# Patient Record
Sex: Male | Born: 1950 | Race: White | Hispanic: No | Marital: Married | Smoking: Never smoker
Health system: Southern US, Community
[De-identification: ages and names within clinical notes are randomized; demographics above are authoritative.]

## PROBLEM LIST (undated history)

## (undated) DIAGNOSIS — Z8619 Personal history of other infectious and parasitic diseases: Secondary | ICD-10-CM

## (undated) DIAGNOSIS — I10 Essential (primary) hypertension: Secondary | ICD-10-CM

## (undated) DIAGNOSIS — G4730 Sleep apnea, unspecified: Secondary | ICD-10-CM

## (undated) DIAGNOSIS — K219 Gastro-esophageal reflux disease without esophagitis: Secondary | ICD-10-CM

## (undated) DIAGNOSIS — I200 Unstable angina: Secondary | ICD-10-CM

## (undated) DIAGNOSIS — M549 Dorsalgia, unspecified: Secondary | ICD-10-CM

## (undated) DIAGNOSIS — I2510 Atherosclerotic heart disease of native coronary artery without angina pectoris: Secondary | ICD-10-CM

## (undated) DIAGNOSIS — H8110 Benign paroxysmal vertigo, unspecified ear: Secondary | ICD-10-CM

## (undated) DIAGNOSIS — K4090 Unilateral inguinal hernia, without obstruction or gangrene, not specified as recurrent: Secondary | ICD-10-CM

## (undated) DIAGNOSIS — E785 Hyperlipidemia, unspecified: Secondary | ICD-10-CM

## (undated) DIAGNOSIS — N529 Male erectile dysfunction, unspecified: Secondary | ICD-10-CM

## (undated) DIAGNOSIS — R252 Cramp and spasm: Secondary | ICD-10-CM

## (undated) HISTORY — DX: Cramp and spasm: R25.2

## (undated) HISTORY — PX: COLONOSCOPY: SHX174

## (undated) HISTORY — PX: KNEE ARTHROSCOPY: SHX127

## (undated) HISTORY — DX: Hyperlipidemia, unspecified: E78.5

## (undated) HISTORY — DX: Sleep apnea, unspecified: G47.30

## (undated) HISTORY — DX: Male erectile dysfunction, unspecified: N52.9

## (undated) HISTORY — DX: Dorsalgia, unspecified: M54.9

## (undated) HISTORY — DX: Personal history of other infectious and parasitic diseases: Z86.19

## (undated) HISTORY — DX: Unilateral inguinal hernia, without obstruction or gangrene, not specified as recurrent: K40.90

## (undated) HISTORY — DX: Essential (primary) hypertension: I10

## (undated) HISTORY — PX: CARDIAC CATHETERIZATION: SHX172

## (undated) HISTORY — DX: Benign paroxysmal vertigo, unspecified ear: H81.10

## (undated) HISTORY — PX: SHOULDER SURGERY: SHX246

---

## 1998-02-06 ENCOUNTER — Emergency Department (HOSPITAL_COMMUNITY): Admission: EM | Admit: 1998-02-06 | Discharge: 1998-02-06 | Payer: Self-pay | Admitting: Emergency Medicine

## 1998-02-06 ENCOUNTER — Encounter: Payer: Self-pay | Admitting: Emergency Medicine

## 2002-01-04 ENCOUNTER — Inpatient Hospital Stay (HOSPITAL_COMMUNITY): Admission: EM | Admit: 2002-01-04 | Discharge: 2002-01-05 | Payer: Self-pay | Admitting: *Deleted

## 2004-04-14 ENCOUNTER — Ambulatory Visit: Payer: Self-pay | Admitting: Family Medicine

## 2005-01-25 ENCOUNTER — Ambulatory Visit: Payer: Self-pay | Admitting: Internal Medicine

## 2005-06-20 ENCOUNTER — Ambulatory Visit: Payer: Self-pay | Admitting: Family Medicine

## 2005-09-11 ENCOUNTER — Ambulatory Visit: Payer: Self-pay | Admitting: Internal Medicine

## 2005-09-27 ENCOUNTER — Ambulatory Visit: Payer: Self-pay | Admitting: Internal Medicine

## 2005-11-17 ENCOUNTER — Emergency Department (HOSPITAL_COMMUNITY): Admission: EM | Admit: 2005-11-17 | Discharge: 2005-11-17 | Payer: Self-pay | Admitting: Emergency Medicine

## 2006-03-14 ENCOUNTER — Ambulatory Visit: Payer: Self-pay | Admitting: Internal Medicine

## 2006-04-16 ENCOUNTER — Ambulatory Visit: Payer: Self-pay | Admitting: Internal Medicine

## 2006-05-16 ENCOUNTER — Ambulatory Visit: Payer: Self-pay | Admitting: Internal Medicine

## 2006-05-30 ENCOUNTER — Ambulatory Visit: Payer: Self-pay | Admitting: Internal Medicine

## 2006-07-29 ENCOUNTER — Ambulatory Visit: Payer: Self-pay | Admitting: Internal Medicine

## 2006-08-27 ENCOUNTER — Ambulatory Visit: Payer: Self-pay | Admitting: Internal Medicine

## 2006-11-15 ENCOUNTER — Encounter: Payer: Self-pay | Admitting: Internal Medicine

## 2006-11-15 DIAGNOSIS — I10 Essential (primary) hypertension: Secondary | ICD-10-CM

## 2006-11-15 DIAGNOSIS — K219 Gastro-esophageal reflux disease without esophagitis: Secondary | ICD-10-CM

## 2006-11-15 HISTORY — DX: Essential (primary) hypertension: I10

## 2006-11-21 ENCOUNTER — Ambulatory Visit: Payer: Self-pay | Admitting: Internal Medicine

## 2007-01-16 ENCOUNTER — Ambulatory Visit: Payer: Self-pay | Admitting: Internal Medicine

## 2007-01-16 DIAGNOSIS — J069 Acute upper respiratory infection, unspecified: Secondary | ICD-10-CM | POA: Insufficient documentation

## 2007-01-23 ENCOUNTER — Telehealth: Payer: Self-pay | Admitting: Internal Medicine

## 2007-02-13 ENCOUNTER — Emergency Department (HOSPITAL_COMMUNITY): Admission: EM | Admit: 2007-02-13 | Discharge: 2007-02-13 | Payer: Self-pay | Admitting: Emergency Medicine

## 2007-02-14 ENCOUNTER — Telehealth: Payer: Self-pay | Admitting: Internal Medicine

## 2007-03-03 ENCOUNTER — Ambulatory Visit: Payer: Self-pay | Admitting: Internal Medicine

## 2007-03-03 LAB — CONVERTED CEMR LAB
ALT: 19 units/L (ref 0–53)
AST: 20 units/L (ref 0–37)
Albumin: 4.1 g/dL (ref 3.5–5.2)
Alkaline Phosphatase: 80 units/L (ref 39–117)
BUN: 16 mg/dL (ref 6–23)
Basophils Absolute: 0 10*3/uL (ref 0.0–0.1)
Basophils Relative: 0 % (ref 0.0–1.0)
Bilirubin Urine: NEGATIVE
Bilirubin, Direct: 0.2 mg/dL (ref 0.0–0.3)
CO2: 29 meq/L (ref 19–32)
Calcium: 9.9 mg/dL (ref 8.4–10.5)
Chloride: 104 meq/L (ref 96–112)
Cholesterol: 167 mg/dL (ref 0–200)
Creatinine, Ser: 0.9 mg/dL (ref 0.4–1.5)
Eosinophils Absolute: 0.2 10*3/uL (ref 0.0–0.6)
Eosinophils Relative: 3 % (ref 0.0–5.0)
GFR calc Af Amer: 112 mL/min
GFR calc non Af Amer: 93 mL/min
Glucose, Bld: 88 mg/dL (ref 70–99)
Glucose, Urine, Semiquant: NEGATIVE
HCT: 42.8 % (ref 39.0–52.0)
HDL: 31.5 mg/dL — ABNORMAL LOW (ref >39.0)
Hemoglobin: 15 g/dL (ref 13.0–17.0)
Ketones, urine, test strip: NEGATIVE
LDL Cholesterol: 106 mg/dL — ABNORMAL HIGH (ref 0–99)
Lymphocytes Relative: 28.6 % (ref 12.0–46.0)
MCHC: 35.1 g/dL (ref 30.0–36.0)
MCV: 94.1 fL (ref 78.0–100.0)
Monocytes Absolute: 0.6 10*3/uL (ref 0.2–0.7)
Monocytes Relative: 7.8 % (ref 3.0–11.0)
Neutro Abs: 4.3 10*3/uL (ref 1.4–7.7)
Neutrophils Relative %: 60.6 % (ref 43.0–77.0)
Nitrite: NEGATIVE
PSA: 0.49 ng/mL (ref 0.10–4.00)
Platelets: 253 10*3/uL (ref 150–400)
Potassium: 4.5 meq/L (ref 3.5–5.1)
Protein, U semiquant: NEGATIVE
RBC: 4.55 M/uL (ref 4.22–5.81)
RDW: 13 % (ref 11.5–14.6)
Sodium: 141 meq/L (ref 135–145)
Specific Gravity, Urine: 1.02
TSH: 1.22 microintl units/mL (ref 0.35–5.50)
Total Bilirubin: 0.8 mg/dL (ref 0.3–1.2)
Total CHOL/HDL Ratio: 5.3
Total Protein: 6.9 g/dL (ref 6.0–8.3)
Triglycerides: 149 mg/dL (ref 0–149)
Urobilinogen, UA: 0.2
VLDL: 30 mg/dL (ref 0–40)
WBC Urine, dipstick: NEGATIVE
WBC: 7.2 10*3/uL (ref 4.5–10.5)
pH: 5.5

## 2007-03-10 ENCOUNTER — Ambulatory Visit: Payer: Self-pay | Admitting: Internal Medicine

## 2007-03-10 DIAGNOSIS — M549 Dorsalgia, unspecified: Secondary | ICD-10-CM

## 2007-03-10 HISTORY — DX: Dorsalgia, unspecified: M54.9

## 2007-09-04 ENCOUNTER — Ambulatory Visit: Payer: Self-pay | Admitting: Internal Medicine

## 2007-10-27 ENCOUNTER — Emergency Department (HOSPITAL_COMMUNITY): Admission: EM | Admit: 2007-10-27 | Discharge: 2007-10-27 | Payer: Self-pay | Admitting: Emergency Medicine

## 2007-12-05 ENCOUNTER — Ambulatory Visit: Payer: Self-pay | Admitting: Internal Medicine

## 2008-03-02 ENCOUNTER — Ambulatory Visit: Payer: Self-pay | Admitting: Internal Medicine

## 2008-03-02 DIAGNOSIS — R109 Unspecified abdominal pain: Secondary | ICD-10-CM | POA: Insufficient documentation

## 2008-05-11 ENCOUNTER — Ambulatory Visit: Payer: Self-pay | Admitting: Internal Medicine

## 2008-05-14 ENCOUNTER — Telehealth: Payer: Self-pay | Admitting: Internal Medicine

## 2008-10-04 ENCOUNTER — Ambulatory Visit: Payer: Self-pay | Admitting: Internal Medicine

## 2008-10-12 ENCOUNTER — Telehealth: Payer: Self-pay | Admitting: Internal Medicine

## 2008-10-18 ENCOUNTER — Ambulatory Visit: Payer: Self-pay | Admitting: Internal Medicine

## 2008-11-16 ENCOUNTER — Ambulatory Visit: Payer: Self-pay | Admitting: Internal Medicine

## 2008-11-16 DIAGNOSIS — G4730 Sleep apnea, unspecified: Secondary | ICD-10-CM | POA: Insufficient documentation

## 2008-11-16 HISTORY — DX: Sleep apnea, unspecified: G47.30

## 2008-12-03 ENCOUNTER — Ambulatory Visit: Payer: Self-pay | Admitting: Family Medicine

## 2008-12-03 ENCOUNTER — Encounter: Payer: Self-pay | Admitting: Internal Medicine

## 2008-12-03 DIAGNOSIS — R252 Cramp and spasm: Secondary | ICD-10-CM

## 2008-12-03 HISTORY — DX: Cramp and spasm: R25.2

## 2008-12-03 LAB — CONVERTED CEMR LAB
BUN: 20 mg/dL (ref 6–23)
CO2: 25 meq/L (ref 19–32)
Calcium: 9.8 mg/dL (ref 8.4–10.5)
Chloride: 105 meq/L (ref 96–112)
Creatinine, Ser: 0.97 mg/dL (ref 0.40–1.50)
Glucose, Bld: 82 mg/dL (ref 70–99)
Potassium: 4.9 meq/L (ref 3.5–5.3)
Sodium: 141 meq/L (ref 135–145)

## 2008-12-09 ENCOUNTER — Telehealth (INDEPENDENT_AMBULATORY_CARE_PROVIDER_SITE_OTHER): Payer: Self-pay | Admitting: *Deleted

## 2009-04-09 HISTORY — PX: COLONOSCOPY: SHX174

## 2009-08-08 ENCOUNTER — Ambulatory Visit: Payer: Self-pay | Admitting: Internal Medicine

## 2009-08-08 DIAGNOSIS — H8110 Benign paroxysmal vertigo, unspecified ear: Secondary | ICD-10-CM

## 2009-08-08 HISTORY — DX: Benign paroxysmal vertigo, unspecified ear: H81.10

## 2009-10-13 ENCOUNTER — Ambulatory Visit: Payer: Self-pay | Admitting: Internal Medicine

## 2009-10-13 LAB — CONVERTED CEMR LAB
Bilirubin Urine: NEGATIVE
Glucose, Urine, Semiquant: NEGATIVE
Ketones, urine, test strip: NEGATIVE
Nitrite: NEGATIVE
Protein, U semiquant: NEGATIVE
Specific Gravity, Urine: 1.01
Urobilinogen, UA: 0.2
WBC Urine, dipstick: NEGATIVE
pH: 5

## 2009-10-14 ENCOUNTER — Telehealth: Payer: Self-pay | Admitting: Family Medicine

## 2009-10-14 ENCOUNTER — Ambulatory Visit: Payer: Self-pay | Admitting: Family Medicine

## 2009-10-14 DIAGNOSIS — R319 Hematuria, unspecified: Secondary | ICD-10-CM

## 2009-10-14 LAB — CONVERTED CEMR LAB
Basophils Absolute: 0.1 10*3/uL (ref 0.0–0.1)
Basophils Relative: 0.8 % (ref 0.0–3.0)
Eosinophils Absolute: 0.2 10*3/uL (ref 0.0–0.7)
Eosinophils Relative: 2.5 % (ref 0.0–5.0)
HCT: 41.7 % (ref 39.0–52.0)
Hemoglobin: 14.4 g/dL (ref 13.0–17.0)
Lymphocytes Relative: 29.5 % (ref 12.0–46.0)
Lymphs Abs: 2.7 10*3/uL (ref 0.7–4.0)
MCHC: 34.5 g/dL (ref 30.0–36.0)
MCV: 94.7 fL (ref 78.0–100.0)
Monocytes Absolute: 0.5 10*3/uL (ref 0.1–1.0)
Monocytes Relative: 5.4 % (ref 3.0–12.0)
Neutro Abs: 5.6 10*3/uL (ref 1.4–7.7)
Neutrophils Relative %: 61.8 % (ref 43.0–77.0)
Platelets: 265 10*3/uL (ref 150.0–400.0)
RBC: 4.41 M/uL (ref 4.22–5.81)
RDW: 13.6 % (ref 11.5–14.6)
WBC: 9 10*3/uL (ref 4.5–10.5)

## 2009-10-20 ENCOUNTER — Ambulatory Visit: Payer: Self-pay | Admitting: Internal Medicine

## 2009-10-21 ENCOUNTER — Ambulatory Visit: Payer: Self-pay | Admitting: Cardiology

## 2009-10-24 ENCOUNTER — Telehealth: Payer: Self-pay | Admitting: Internal Medicine

## 2009-12-05 ENCOUNTER — Ambulatory Visit: Payer: Self-pay | Admitting: Internal Medicine

## 2009-12-05 DIAGNOSIS — R1031 Right lower quadrant pain: Secondary | ICD-10-CM

## 2009-12-05 DIAGNOSIS — K4090 Unilateral inguinal hernia, without obstruction or gangrene, not specified as recurrent: Secondary | ICD-10-CM | POA: Insufficient documentation

## 2009-12-05 HISTORY — DX: Unilateral inguinal hernia, without obstruction or gangrene, not specified as recurrent: K40.90

## 2009-12-16 ENCOUNTER — Encounter: Payer: Self-pay | Admitting: Family Medicine

## 2010-01-09 ENCOUNTER — Ambulatory Visit: Payer: Self-pay | Admitting: Internal Medicine

## 2010-01-09 LAB — HM COLONOSCOPY

## 2010-02-06 ENCOUNTER — Ambulatory Visit: Payer: Self-pay | Admitting: Internal Medicine

## 2010-04-21 ENCOUNTER — Telehealth: Payer: Self-pay | Admitting: Internal Medicine

## 2010-04-25 ENCOUNTER — Ambulatory Visit
Admission: RE | Admit: 2010-04-25 | Discharge: 2010-04-25 | Payer: Self-pay | Source: Home / Self Care | Attending: Internal Medicine | Admitting: Internal Medicine

## 2010-05-03 ENCOUNTER — Telehealth: Payer: Self-pay | Admitting: Internal Medicine

## 2010-05-11 NOTE — Consult Note (Signed)
Summary: [facility]   Imported By: Maryln Gottron 01/19/2010 14:47:19  _____________________________________________________________________  External Attachment:    Type:   Image     Comment:   External Document

## 2010-05-11 NOTE — Assessment & Plan Note (Signed)
Summary: 6 MONTH FUP//CCM   Vital Signs:  Patient profile:   60 year old male Height:      71 inches Weight:      243 pounds BMI:     34.01 Temp:     98.3 degrees F oral Pulse rate:   76 / minute Resp:     14 per minute BP sitting:   140 / 84  (left arm)  Vitals Entered By: Willy Eddy, LPN (February 06, 2010 4:08 PM) CC: 6 month roa Is Patient Diabetic? No   Primary Care Provider:  Jeralyn Ruths  CC:  6 month roa.  History of Present Illness: 60 year old patient who is seen today for his bimonthly follow up.  He has treated hypertension.  He has had a recent GI and general surgical evaluation for lower abdominal pain.  This has improved.  He is recovering from a URI but in general, doing quite well.  He does have gastroesophageal reflux disease  Preventive Screening-Counseling & Management  Alcohol-Tobacco     Smoking Status: never  Current Problems (verified): 1)  Abdominal Pain-rlq  (ICD-789.03) 2)  Inguinal Hernia, Right  (ICD-550.90) 3)  Special Screening For Malignant Neoplasms Colon  (ICD-V76.51) 4)  Hematuria Unspecified  (ICD-599.70) 5)  Vertigo, Positional  (ICD-386.11) 6)  Leg Cramps  (ICD-729.82) 7)  Sleep Apnea  (ICD-780.57) 8)  Abdominal Pain  (ICD-789.00) 9)  Back Pain  (ICD-724.5) 10)  Upper Respiratory Infection, Viral  (ICD-465.9) 11)  Hypertension  (ICD-401.9) 12)  Gerd  (ICD-530.81)  Current Medications (verified): 1)  Epipen 0.3 Mg/0.11ml (1:1000)  Devi (Epinephrine Hcl (Anaphylaxis)) 2)  Benicar 20 Mg Tabs (Olmesartan Medoxomil) .... One Daily 3)  Prilosec Otc 20 Mg Tbec (Omeprazole Magnesium) .Marland Kitchen.. 1 By Mouth Once Daily As Needed  Allergies (verified): 1)  Codeine Phosphate (Codeine Phosphate) 2)  Penicillin G Potassium (Penicillin G Potassium)  Past History:  Past Medical History: Reviewed history from 11/16/2008 and no changes required. ED GERD Hypertension history of mumps orchitis rule out OSA  Past Surgical  History: Reviewed history from 03/10/2007 and no changes required. R knee surgery arthroscopic 2003, negative heart catheterization  Family History: Reviewed history from 03/10/2007 and no changes required. both parents, history of hypertension.  Two brothers two sisters, one sister with hypertension one sister deceased from leukemia at age 85  father died at age 79, hypertension, renal failure mother history of late onset diabetes- died age 24 complications of pneumonia following a hip fracture  Review of Systems  The patient denies anorexia, fever, weight loss, weight gain, vision loss, decreased hearing, hoarseness, chest pain, syncope, dyspnea on exertion, peripheral edema, prolonged cough, headaches, hemoptysis, abdominal pain, melena, hematochezia, severe indigestion/heartburn, hematuria, incontinence, genital sores, muscle weakness, suspicious skin lesions, transient blindness, difficulty walking, depression, unusual weight change, abnormal bleeding, enlarged lymph nodes, angioedema, breast masses, and testicular masses.    Physical Exam  General:  overweight-appearing.  140/80 Head:  Normocephalic and atraumatic without obvious abnormalities. No apparent alopecia or balding. Eyes:  No corneal or conjunctival inflammation noted. EOMI. Perrla. Funduscopic exam benign, without hemorrhages, exudates or papilledema. Vision grossly normal. Ears:  External ear exam shows no significant lesions or deformities.  Otoscopic examination reveals clear canals, tympanic membranes are intact bilaterally without bulging, retraction, inflammation or discharge. Hearing is grossly normal bilaterally. Mouth:  Oral mucosa and oropharynx without lesions or exudates.  Teeth in good repair. Neck:  No deformities, masses, or tenderness noted. Lungs:  Normal respiratory effort, chest expands  symmetrically. Lungs are clear to auscultation, no crackles or wheezes. Heart:  Normal rate and regular rhythm. S1 and  S2 normal without gallop, murmur, click, rub or other extra sounds. Abdomen:  Bowel sounds positive,abdomen soft and non-tender without masses, organomegaly or hernias noted. Msk:  No deformity or scoliosis noted of thoracic or lumbar spine.   Pulses:  R and L carotid,radial,femoral,dorsalis pedis and posterior tibial pulses are full and equal bilaterally Extremities:  No clubbing, cyanosis, edema, or deformity noted with normal full range of motion of all joints.     Impression & Recommendations:  Problem # 1:  INGUINAL HERNIA, RIGHT (ICD-550.90)  Problem # 2:  HYPERTENSION (ICD-401.9)  His updated medication list for this problem includes:    Benicar 20 Mg Tabs (Olmesartan medoxomil) ..... One daily  Problem # 3:  GERD (ICD-530.81)  His updated medication list for this problem includes:    Prilosec Otc 20 Mg Tbec (Omeprazole magnesium) .Marland Kitchen... 1 by mouth once daily as needed  Complete Medication List: 1)  Epipen 0.3 Mg/0.108ml (1:1000) Devi (Epinephrine hcl (anaphylaxis)) 2)  Benicar 20 Mg Tabs (Olmesartan medoxomil) .... One daily 3)  Prilosec Otc 20 Mg Tbec (Omeprazole magnesium) .Marland Kitchen.. 1 by mouth once daily as needed  Patient Instructions: 1)  Please schedule a follow-up appointment in 6 months for CPX  2)  Limit your Sodium (Salt). 3)  It is important that you exercise regularly at least 20 minutes 5 times a week. If you develop chest pain, have severe difficulty breathing, or feel very tired , stop exercising immediately and seek medical attention. 4)  You need to lose weight. Consider a lower calorie diet and regular exercise.  5)  Check your Blood Pressure regularly. If it is above: you should make an appointment. Prescriptions: BENICAR 20 MG TABS (OLMESARTAN MEDOXOMIL) one daily  #90 x 6   Entered and Authorized by:   Gordy Savers  MD   Signed by:   Gordy Savers  MD on 02/06/2010   Method used:   Electronically to        [facility] [address] [phone]*  (retail)       [address]       [place], [place]  [address]       Ph: [phone] or [phone]       Fax: [phone]   RxID:   [id number]    Orders Added: 1)  Est. Patient Level IV [69678]

## 2010-05-11 NOTE — Assessment & Plan Note (Signed)
Summary: pain in groin area//ccm/pt rsc/cjr   Vital Signs:  Patient profile:   60 year old male Weight:      241 pounds Temp:     98.6 degrees F oral BP sitting:   100 / 72  (left arm) Cuff size:   large  Vitals Entered By: Sid Falcon LPN (October 14, 4130 4:40 PM) CC: pain right groin area X 1 week   History of Present Illness: Patient is seen with one week history of initial complaint of right groin pain. On further questioning this is actually right lower abdominal. Pain is mild to moderate intensity. Worse with sitting. Not progressive. No injury. Denies any history of hernia or any bulging. No fever. Good appetite. Denies nausea, vomiting, or diarrhea. No alleviating factors. No significant pain with ambulation. No dysuria.  No hx of kidney stones.  Allergies: 1)  Codeine Phosphate (Codeine Phosphate) 2)  Penicillin G Potassium (Penicillin G Potassium)  Past History:  Past Medical History: Last updated: 11/16/2008 ED GERD Hypertension history of mumps orchitis rule out OSA PMH reviewed for relevance  Review of Systems  The patient denies anorexia, fever, weight loss, chest pain, peripheral edema, hemoptysis, melena, hematochezia, severe indigestion/heartburn, hematuria, incontinence, and muscle weakness.    Physical Exam  General:  Well-developed,well-nourished,in no acute distress; alert,appropriate and cooperative throughout examination Head:  Normocephalic and atraumatic without obvious abnormalities. No apparent alopecia or balding. Mouth:  Oral mucosa and oropharynx without lesions or exudates.  Teeth in good repair. Neck:  No deformities, masses, or tenderness noted. Lungs:  Normal respiratory effort, chest expands symmetrically. Lungs are clear to auscultation, no crackles or wheezes. Heart:  Normal rate and regular rhythm. S1 and S2 normal without gallop, murmur, click, rub or other extra sounds. Abdomen:  normal bowel sounds. Soft with mild tenderness in  right lower quadrant to deep palpation but he also has some minimal somewhat lesser tenderness left lower quadrant. No upper abdominal tenderness. No guarding or rebound. No masses palpated. Genitalia:  atrophic testes bilaterally. No hernia   Impression & Recommendations:  Problem # 1:  ABDOMINAL PAIN (ICD-789.00) Assessment New urine dipstick reveals 2+ blood otherwise normal. CBC obtained.  doubt acute appendicitis in this patient with symptoms for one week with no fever and normal appetite but if white count elevated consider CT to further assess. Orders: TLB-CBC Platelet - w/Differential (85025-CBCD) UA Dipstick w/o Micro (manual) (44010)  Complete Medication List: 1)  Epipen 0.3 Mg/0.31ml (1:1000) Devi (Epinephrine hcl (anaphylaxis)) 2)  Benicar 20 Mg Tabs (Olmesartan medoxomil) .... One daily 3)  Voltaren-xr 100 Mg Xr24h-tab (Diclofenac sodium) .... Prn  Patient Instructions: 1)  followup immediately if you have any increased fever, vomiting, or worsening abdominal pain  Laboratory Results   Urine Tests    Routine Urinalysis   Color: yellow Appearance: Clear Glucose: negative   (Normal Range: Negative) Bilirubin: negative   (Normal Range: Negative) Ketone: negative   (Normal Range: Negative) Spec. Gravity: 1.010   (Normal Range: 1.003-1.035) Blood: moderate   (Normal Range: Negative) pH: 5.0   (Normal Range: 5.0-8.0) Protein: negative   (Normal Range: Negative) Urobilinogen: 0.2   (Normal Range: 0-1) Nitrite: negative   (Normal Range: Negative) Leukocyte Esterace: negative   (Normal Range: Negative)    Comments: Sid Falcon LPN  October 14, 2723 5:24 PM

## 2010-05-11 NOTE — Progress Notes (Signed)
Summary: Status of Urology evaluation  Phone Note Outgoing Call   Call placed by: Milford Cage NCMA,  April 21, 2010 8:54 AM Call placed to: Specialist Summary of Call: [facility] to get notes from referral made in Sept.  They stated the patient rescheduled appt. to 01/02/10 from 12/21/09 and cxl'd both appts.    Initial call taken by: Milford Cage NCMA,  April 21, 2010 8:55 AM  Follow-up for Phone Call        Thanks for following up. I'll forward this to his PCP, Dr. Lesia Hausen. Follow-up by: Hilarie Fredrickson MD,  April 21, 2010 10:18 AM

## 2010-05-11 NOTE — Assessment & Plan Note (Signed)
Summary: abd pain,hematuria---em   History of Present Illness Visit Type: Initial Consult Primary GI MD: Yancey Flemings MD Primary Provider: Jeralyn Ruths Requesting Provider: Jeralyn Ruths Chief Complaint: RLQ pain in groin area. Has been havin the pain for 2 months History of Present Illness:   60 year old white male with hypertension and GERD. He sent to the regarding the two-month history of right lower quadrant/groin pain. He describes sharp discomfort lasting a few seconds. This comes and goes. He can not directly related to meals, activity, or defecation. He was evaluated with blood work and urinalysis was found to have microscopic hematuria. He underwent both noncontrast and contrast enhanced CT scan with no significant abnormalities noted. He denies nausea vomiting melena hematochezia or weight loss. No prior GI evaluations. He has been encouraged to have screening colonoscopy. No family history of GI malignancy. His GI review of systems is remarkable for 3-4 year history of intermittent GERD symptoms with dietary indiscretion. Occasional Prilosec OTC helps. No dysphagia. He is a nonsmoker and does not use alcohol.   GI Review of Systems    Reports abdominal pain, acid reflux, and  heartburn.     Location of  Abdominal pain: RLQ.    Denies belching, bloating, chest pain, dysphagia with liquids, dysphagia with solids, loss of appetite, nausea, vomiting, vomiting blood, weight loss, and  weight gain.        Denies anal fissure, black tarry stools, change in bowel habit, constipation, diarrhea, diverticulosis, fecal incontinence, heme positive stool, hemorrhoids, irritable bowel syndrome, jaundice, light color stool, liver problems, rectal bleeding, and  rectal pain. Preventive Screening-Counseling & Management  Alcohol-Tobacco     Smoking Status: never    Current Medications (verified): 1)  Epipen 0.3 Mg/0.84ml (1:1000)  Devi (Epinephrine Hcl (Anaphylaxis)) 2)  Benicar 20 Mg  Tabs (Olmesartan Medoxomil) .... One Daily 3)  Voltaren-Xr 100 Mg Xr24h-Tab (Diclofenac Sodium) .... Prn 4)  Hydrocodone-Acetaminophen 5-325 Mg Tabs (Hydrocodone-Acetaminophen) .... One To Two Tabs By Mouth Every 4-6 Hours As Needed Pain 5)  Prilosec Otc 20 Mg Tbec (Omeprazole Magnesium) .Marland Kitchen.. 1 By Mouth Once Daily As Needed  Allergies (verified): 1)  Codeine Phosphate (Codeine Phosphate) 2)  Penicillin G Potassium (Penicillin G Potassium)  Past History:  Past Medical History: Reviewed history from 11/16/2008 and no changes required. ED GERD Hypertension history of mumps orchitis rule out OSA  Past Surgical History: Reviewed history from 03/10/2007 and no changes required. R knee surgery arthroscopic 2003, negative heart catheterization  Family History: Reviewed history from 03/10/2007 and no changes required. both parents, history of hypertension.  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  Alcohol Use - no Daily Caffeine Use  Review of Systems       The patient complains of back pain.  The patient denies allergy/sinus, anemia, anxiety-new, arthritis/joint pain, blood in urine, breast changes/lumps, change in vision, confusion, cough, coughing up blood, depression-new, fainting, fatigue, fever, headaches-new, hearing problems, heart murmur, heart rhythm changes, itching, muscle pains/cramps, night sweats, nosebleeds, shortness of breath, skin rash, sleeping problems, sore throat, swelling of feet/legs, swollen lymph glands, thirst - excessive, urination - excessive, urination changes/pain, urine leakage, vision changes, and voice change.    Vital Signs:  Patient profile:   60 year old male Height:      21  inches Weight:      243 pounds BMI:      34.01 BSA:     2.29 Pulse rate:   80 / minute Pulse rhythm:   regular BP sitting:   110 / 90  (left arm)  Vitals Entered By: Merri Ray CMA Duncan Dull) (December 05, 2009 9:24 AM)  Physical Exam  General:  Well developed, well nourished, no acute distress. Head:  Normocephalic and atraumatic. Eyes:  PERRLA, no icterus. Ears:  Normal auditory acuity. Nose:  No deformity, discharge,  or lesions. Mouth:  No deformity or lesions, dentition normal. Neck:  Supple; no masses or thyromegaly. Lungs:  Clear throughout to auscultation. Heart:  Regular rate and rhythm; no murmurs, rubs,  or bruits. Abdomen:  Soft,and nondistended. Mild tenderness in the deep right lower quadrant with palpation. No mass.. No masses, hepatosplenomegaly or abdominal hernias noted. Normal bowel sounds. Rectal:  deferred until colonoscopy Genitalia:  possible right inguinal hernia. Tenderness Msk:  Symmetrical with no gross deformities. Normal posture. Pulses:  Normal pulses noted. Extremities:  No clubbing, cyanosis, edema or deformities noted. Neurologic:  Alert and  oriented x4;  grossly normal neurologically. Skin:  Intact without significant lesions or rashes. Psych:  Alert and cooperative. Normal mood and affect.   Impression & Recommendations:  Problem # 1:  ABDOMINAL PAIN-RLQ (ICD-789.03) right lower quadrant and right inguinal discomfort with possible he will hernia.  Plan: #1. General surgical opinion regarding possible right inguinal hernia  Problem # 2:  HEMATURIA UNSPECIFIED (ICD-599.70) microscopic hematuria with right lower quadrant/inguinal discomfort. Negative CT scan.  Plan: #1. Urologic evaluation to determine if any further workup warranted regarding microscopic hematuria in this 60 year old with right lower quadrant pain  Problem # 3:  SPECIAL SCREENING FOR MALIGNANT NEOPLASMS COLON (ICD-V76.51) the patient is an appropriate candidate for screening colonoscopy without  contraindication. Can also rule out intracolonic cause for pain.. The nature of colonoscopy as well as risks, benefits, and alternatives were reviewed. He understood and agreed to proceed. Movi prep prescribed. The patient instructed on its use.  Problem # 4:  GERD (ICD-530.81) GERD without alarm features. Recommend reflux precautions. Okay to use Prilosec on demand  Other Orders: Colonoscopy (Colon) [facility] ([facility]) [facility]  Patient Instructions: 1)  Copy sent to : Jeralyn Ruths, Glenna Fellows, MD, Vic Blackbird, MD 2)  Colon [facility] 01/09/10 3:00 pm arrive at 2:00 pm. 3)  Movi prep instructions given to patient. 4)  Movi prep Rx. sent to pharmacy. 5)  [facility] 12/16/09 2:30 pm arrive at 2:00 pm Dr. Johna Sheriff  6)  [facility] Appt. 12/21/09 1:00 pm arrive at 12:45 pm Dr. Aldean Ast 7)  Colonoscopy and Flexible Sigmoidoscopy brochure given.  8)  The medication list was reviewed and reconciled.  All changed / newly prescribed medications were explained.  A complete medication list was provided to the patient / caregiver. Prescriptions: MOVIPREP 100 GM  SOLR (PEG-KCL-NACL-NASULF-NA ASC-C) As per prep instructions.  #1 x 0   Entered by:   Milford Cage NCMA   Authorized by:   Hilarie Fredrickson MD   Signed by:   Milford Cage NCMA on 12/05/2009   Method used:   Electronically to        [facility] [address] [phone]* (retail)       [address]       [place], [place]  [address]       Ph: [phone]  or [phone]       Fax: [phone]   RxID:   [id number]

## 2010-05-11 NOTE — Assessment & Plan Note (Signed)
Summary: st/njr   Vital Signs:  Patient profile:   60 year old male Weight:      248 pounds Temp:     98.5 degrees F BP sitting:   122 / 82  Vitals Entered By: Lynann Beaver CMA AAMA (April 25, 2010 4:19 PM) CC: sore throat Is Patient Diabetic? No Pain Assessment Patient in pain? no        Primary Care Provider:  Jeralyn Ruths  CC:  sore throat.  History of Present Illness: a 60 year old patient with a one-week history of sore throat and head and chest congestion and mild nonproductive cough.  His chief complaint is sore throat pain.  He does have a history of treated hypertension, which has been stable, as well as gastroesophageal reflux disease.  He also has a history of hand dermatitis related to occupational exposure solvents  Current Medications (verified): 1)  Epipen 0.3 Mg/0.71ml (1:1000)  Devi (Epinephrine Hcl (Anaphylaxis)) 2)  Benicar 20 Mg Tabs (Olmesartan Medoxomil) .... One Daily 3)  Prilosec Otc 20 Mg Tbec (Omeprazole Magnesium) .Marland Kitchen.. 1 By Mouth Once Daily As Needed  Allergies (verified): 1)  Codeine Phosphate (Codeine Phosphate) 2)  Penicillin G Potassium (Penicillin G Potassium)  Past History:  Past Medical History: Reviewed history from 11/16/2008 and no changes required. ED GERD Hypertension history of mumps orchitis rule out OSA  Past Surgical History: Reviewed history from 03/10/2007 and no changes required. R knee surgery arthroscopic 2003, negative heart catheterization  Family History: Reviewed history from 02/06/2010 and no changes required. both parents, history of hypertension.  Two brothers two sisters, one sister with hypertension one sister deceased from leukemia at age 73  father died at age 64, hypertension, renal failure mother history of late onset diabetes- died age 89 complications of pneumonia following a hip fracture  Social History: Reviewed history from 12/05/2009 and no changes required. 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  Alcohol Use - no Daily Caffeine Use  Review of Systems       The patient complains of anorexia and prolonged cough.  The patient denies fever, weight loss, weight gain, vision loss, decreased hearing, hoarseness, chest pain, syncope, dyspnea on exertion, peripheral edema, headaches, hemoptysis, abdominal pain, melena, hematochezia, severe indigestion/heartburn, hematuria, incontinence, genital sores, muscle weakness, suspicious skin lesions, transient blindness, difficulty walking, depression, unusual weight change, abnormal bleeding, enlarged lymph nodes, angioedema, breast masses, and testicular masses.    Physical Exam  General:  overweight-appearing.  120/80overweight-appearing.   Head:  Normocephalic and atraumatic without obvious abnormalities. No apparent alopecia or balding. Eyes:  No corneal or conjunctival inflammation noted. EOMI. Perrla. Funduscopic exam benign, without hemorrhages, exudates or papilledema. Vision grossly normal. Ears:  External ear exam shows no significant lesions or deformities.  Otoscopic examination reveals clear canals, tympanic membranes are intact bilaterally without bulging, retraction, inflammation or discharge. Hearing is grossly normal bilaterally. Mouth:  pharyngeal erythema.  pharyngeal erythema.   Neck:  No deformities, masses, or tenderness noted. Lungs:  Normal respiratory effort, chest expands symmetrically. Lungs are clear to auscultation, no crackles or wheezes. Heart:  Normal rate and regular rhythm. S1 and S2 normal without gallop, murmur, click, rub or other extra sounds. Abdomen:  Bowel sounds positive,abdomen soft and non-tender without masses, organomegaly or hernias noted. Msk:  No deformity or scoliosis noted of thoracic or lumbar spine.     Impression & Recommendations:  Problem # 1:  PHARYNGITIS-ACUTE (ICD-462)  Problem # 2:  HYPERTENSION (ICD-401.9)  His updated  medication list for this problem includes:    Benicar 20 Mg Tabs (Olmesartan medoxomil) ..... One daily  His updated medication list for this problem includes:    Benicar 20 Mg Tabs (Olmesartan medoxomil) ..... One daily  Complete Medication List: 1)  Epipen 0.3 Mg/0.41ml (1:1000) Devi (Epinephrine hcl (anaphylaxis)) 2)  Benicar 20 Mg Tabs (Olmesartan medoxomil) .... One daily 3)  Prilosec Otc 20 Mg Tbec (Omeprazole magnesium) .Marland Kitchen.. 1 by mouth once daily as needed 4)  Hydrocodone-acetaminophen 5-500 Mg Tabs (Hydrocodone-acetaminophen) .... One every 6 hours as needed  Patient Instructions: 1)  Please schedule a follow-up appointment in 6 months. 2)  Limit your Sodium (Salt) to less than 2 grams a day(slightly less than 1/2 a teaspoon) to prevent fluid retention, swelling, or worsening of symptoms. Prescriptions: HYDROCODONE-ACETAMINOPHEN 5-500 MG TABS (HYDROCODONE-ACETAMINOPHEN) one every 6 hours as needed  #40 x 0   Entered and Authorized by:   Gordy Savers  MD   Signed by:   Gordy Savers  MD on 04/25/2010   Method used:   Print then Give to Patient   RxID:   [id number]    Orders Added: 1)  Est. Patient Level III [72536]

## 2010-05-11 NOTE — Progress Notes (Signed)
Summary: xray results  Phone Note Call from Patient   Caller: Patient Call For: Dr. Caryl Never Summary of Call: Pt is asking for xray results. [phone] Initial call taken by: Lynann Beaver CMA,  October 14, 2009 4:59 PM  Follow-up for Phone Call        see x-ray result. Follow-up by: Evelena Peat MD,  October 14, 2009 5:44 PM  Additional Follow-up for Phone Call Additional follow up Details #1::        Pt informed and he voiced his understanding Additional Follow-up by: Sid Falcon LPN,  October 15, 4538 5:48 PM

## 2010-05-11 NOTE — Progress Notes (Signed)
Summary: sore throat  Phone Note Call from Patient   Caller: Patient Call For: Gordy Savers  MD Summary of Call: Pt is stilll having the same symptoms as last week with sore throat and chest congestion and wants RX called in. [phone] Initial call taken by: [facility] CMA AAMA,  May 03, 2010 3:31 PM  Follow-up for Phone Call        OK to RF generic vicodin 5-500  #50 for ST and cough; also suggest Mucinex D twice daily for congestion  Follow-up by: Gordy Savers  MD,  May 04, 2010 8:15 AM    Prescriptions: HYDROCODONE-ACETAMINOPHEN 5-500 MG TABS (HYDROCODONE-ACETAMINOPHEN) one every 6 hours as needed  #50 x 0   Entered by:   Lynann Beaver CMA AAMA   Authorized by:   Gordy Savers  MD   Signed by:   Lynann Beaver CMA AAMA on 05/04/2010   Method used:   Telephoned to ...       [facility]  [address] [phone]* (retail)       [address]       [place], [place]  [address]       Ph: [phone] or [phone]       Fax: [phone]   RxID:   [id number]  Notified pt.

## 2010-05-11 NOTE — Assessment & Plan Note (Signed)
Summary: problems with ? kidney stones/pain lower back and side/cjr  I did the are  Vital Signs:  Patient profile:   60 year old male Weight:      243 pounds Temp:     98.2 degrees F oral BP sitting:   120 / 70  (right arm) Cuff size:   regular  Vitals Entered By: Duard Brady LPN (October 20, 2009 4:04 PM) CC: c/o low back apin, waekness ?kidney stone Is Patient Diabetic? No   CC:  c/o low back apin and waekness ?kidney stone.  History of Present Illness: [age] year-old patient who was seen about one week ago with right lower back and groin pain.  He is alert, and have the hematuria at that time.  He does have pain in the back and especially right groin area.  Pain is paroxysmal.  His bowel habits have been normal.  He has a history of hypertension, but no prior history of renal stone disease.  There is been no fever  Allergies: 1)  Codeine Phosphate (Codeine Phosphate) 2)  Penicillin G Potassium (Penicillin G Potassium)  Past History:  Past Medical History: Reviewed history from 11/16/2008 and no changes required. ED GERD Hypertension history of mumps orchitis rule out OSA  Review of Systems       The patient complains of anorexia and abdominal pain.  The patient denies fever, weight loss, weight gain, vision loss, decreased hearing, hoarseness, chest pain, syncope, dyspnea on exertion, peripheral edema, prolonged cough, headaches, hemoptysis, melena, hematochezia, severe indigestion/heartburn, hematuria, incontinence, genital sores, muscle weakness, suspicious skin lesions, transient blindness, difficulty walking, depression, unusual weight change, abnormal bleeding, enlarged lymph nodes, angioedema, breast masses, and testicular masses.    Physical Exam  General:  overweight-appearing.  mildly uncomfortable.  Blood pressure normal Neck:  No deformities, masses, or tenderness noted. Lungs:  Normal respiratory effort, chest expands symmetrically. Lungs are clear to  auscultation, no crackles or wheezes. Heart:  Normal rate and regular rhythm. S1 and S2 normal without gallop, murmur, click, rub or other extra sounds. Abdomen:  mild right lower quadrant tenderness.  Bowel sounds normal.  No rigidity or guarding Msk:  No deformity or scoliosis noted of thoracic or lumbar spine.     Impression & Recommendations:  Problem # 1:  HEMATURIA UNSPECIFIED (ICD-599.70)  Orders: Radiology Referral (Radiology) suspect renal stone.  Will set up for a CT urogram to rule out hydronephrosis.  Confirm diagnosis.  Assess stone size and rule out other pathology to account for the hematuria  Problem # 2:  BACK PAIN (ICD-724.5)  His updated medication list for this problem includes:    Voltaren-xr 100 Mg Xr24h-tab (Diclofenac sodium) .Marland Kitchen... Prn    Hydrocodone-acetaminophen 5-325 Mg Tabs (Hydrocodone-acetaminophen) ..... One to two tabs by mouth every 4-6 hours as needed pain  Orders: UA Dipstick w/o Micro (manual) ([address]) Radiology Referral (Radiology)  Complete Medication List: 1)  Epipen 0.3 Mg/0.14ml (1:1000) Devi (Epinephrine hcl (anaphylaxis)) 2)  Benicar 20 Mg Tabs (Olmesartan medoxomil) .... One daily 3)  Voltaren-xr 100 Mg Xr24h-tab (Diclofenac sodium) .... Prn 4)  Hydrocodone-acetaminophen 5-325 Mg Tabs (Hydrocodone-acetaminophen) .... One to two tabs by mouth every 4-6 hours as needed pain  Patient Instructions: 1)  Drink as much fluid as you can tolerate for the next few days. 2)  CT urogram as scheduled Prescriptions: HYDROCODONE-ACETAMINOPHEN 5-325 MG TABS (HYDROCODONE-ACETAMINOPHEN) one to two tabs by mouth every 4-6 hours as needed pain  #60 x 2   Entered and Authorized by:  Gordy Savers  MD   Signed by:   Gordy Savers  MD on 10/20/2009   Method used:   Print then Give to Patient   RxID:   [id number]   Appended Document: problems with ? kidney stones/pain lower back and side/cjr     Allergies: 1)  Codeine Phosphate  (Codeine Phosphate) 2)  Penicillin G Potassium (Penicillin G Potassium)   Complete Medication List: 1)  Epipen 0.3 Mg/0.17ml (1:1000) Devi (Epinephrine hcl (anaphylaxis)) 2)  Benicar 20 Mg Tabs (Olmesartan medoxomil) .... One daily 3)  Voltaren-xr 100 Mg Xr24h-tab (Diclofenac sodium) .... Prn 4)  Hydrocodone-acetaminophen 5-325 Mg Tabs (Hydrocodone-acetaminophen) .... One to two tabs by mouth every 4-6 hours as needed pain   Laboratory Results   Urine Tests  Date/Time Received: 10/20/2009 Date/Time Reported: 10/20/2009  Routine Urinalysis   Color: straw Appearance: Clear Glucose: negative   (Normal Range: Negative) Bilirubin: negative   (Normal Range: Negative) Ketone: negative   (Normal Range: Negative) Spec. Gravity: 1.025   (Normal Range: 1.003-1.035) Blood: moderate   (Normal Range: Negative) pH: 5.0   (Normal Range: 5.0-8.0) Protein: negative   (Normal Range: Negative) Urobilinogen: 0.2   (Normal Range: 0-1) Nitrite: negative   (Normal Range: Negative) Leukocyte Esterace: negative   (Normal Range: Negative)

## 2010-05-11 NOTE — Procedures (Signed)
Summary: Colonoscopy  Patient: David Vincent Note: All result statuses are Final unless otherwise noted.  Tests: (1) Colonoscopy (COL)   COL Colonoscopy           DONE     [facility]     [address]     [place], [place]  [address]           COLONOSCOPY PROCEDURE REPORT           PATIENT:  David Vincent, David Vincent  MR#:  [id number]     BIRTHDATE:  September 11, 1950, 59 yrs. old  GENDER:  male     ENDOSCOPIST:  Wilhemina Bonito. Eda Keys, MD     REF. BY:  Eleonore Chiquito, M.D.     PROCEDURE DATE:  01/09/2010     PROCEDURE:  Average-risk screening colonoscopy     G0121     ASA CLASS:  Class II     INDICATIONS:  Routine Risk Screening     MEDICATIONS:   Fentanyl 75 mcg IV, Versed 8 mg IV           DESCRIPTION OF PROCEDURE:   After the risks benefits and     alternatives of the procedure were thoroughly explained, informed     consent was obtained.  Digital rectal exam was performed and     revealed no abnormalities.   The LB CF-[id number] endoscope     was introduced through the anus and advanced to the cecum, which     was identified by both the appendix and ileocecal valve, without     limitations.Time to cecum = 2:02 min.  The quality of the prep was     excellent, using MoviPrep.  The instrument was then slowly     withdrawn (time = 9:59 min) as the colon was fully examined.     <<PROCEDUREIMAGES>>           FINDINGS:  Moderate diverticulosis was found ascending colon to     sigmoid colon.  This was otherwise a normal examination of the     colon.  No polyps or cancers were seen.   Retroflexed views in the     rectum revealed no abnormalities.    The scope was then withdrawn     from the patient and the procedure completed.           COMPLICATIONS:  None           ENDOSCOPIC IMPRESSION:     1) Moderate diverticulosis ascending colon to sigmoid colon     2) Otherwise normal examination     3) No polyps or cancers     RECOMMENDATIONS:     1) Continue current colorectal  screening recommendations for     "routine risk" patients with a repeat colonoscopy in 10 years.           ______________________________     Wilhemina Bonito. Eda Keys, MD           CC:  Gordy Savers, MD;Benjamin Hoxworth, MD;The Patient           n.     Rosalie DoctorWilhemina Bonito. Eda Keys at 01/09/2010 03:50 PM           Hazeline Junker, [phone]  Note: An exclamation mark (!) indicates a result that was not dispersed into the flowsheet. Document Creation Date: 01/09/2010 3:51 PM _______________________________________________________________________  (1) Order result status: Final Collection or observation date-time: 01/09/2010 15:43 Requested date-time:  Receipt date-time:  Reported date-time:  Referring Physician:   Ordering Physician: Fransico Setters [phone]) Specimen Source:  Source: Launa Grill Order Number: [id number] Lab site:   Appended Document: Colonoscopy    Clinical Lists Changes  Observations: Added new observation of COLONNXTDUE: 01/2020 (01/09/2010 16:22)

## 2010-05-11 NOTE — Progress Notes (Signed)
Summary:  still having pain  Req referral to GI  Phone Note Call from Patient Call back at [phone]   Caller: Patient Summary of Call: Pt called and said that he is still having same pain as before, in groin area. Pt is req referral to GI. Pls call.  Initial call taken by: Lucy Antigua,  October 24, 2009 9:25 AM  Follow-up for Phone Call        OK to refer to GI  Follow-up by: Gordy Savers  MD,  October 24, 2009 10:10 AM  Additional Follow-up for Phone Call Additional follow up Details #1::        order given for GI referal - pt aware infor to terri and she will call as soon as she makes appt. KIK Additional Follow-up by: Duard Brady LPN,  October 24, 2009 10:18 AM

## 2010-05-11 NOTE — Letter (Signed)
Summary: [facility] Instructions  [facility]  [address] [place], [place] [address]   Phone: [phone]  Fax: [phone]       David Vincent    May 16, 1950    MRN: [id number]        Procedure Day /Date:MONDAY, 01/09/10     Arrival Time:2:00 PM     Procedure Time:3:00 PM     Location of Procedure:                    X  [facility] (4th Floor)   PREPARATION FOR COLONOSCOPY WITH MOVIPREP   Starting 5 days prior to your procedure 01/04/2010 do not eat nuts, seeds, popcorn, corn, beans, peas,  salads, or any raw vegetables.  Do not take any fiber supplements (e.g. Metamucil, Citrucel, and Benefiber).  THE DAY BEFORE YOUR PROCEDURE         DATE: 01/08/2010 DAY: SUNDAY  1.  Drink clear liquids the entire day-NO SOLID FOOD  2.  Do not drink anything colored red or purple.  Avoid juices with pulp.  No orange juice.  3.  Drink at least 64 oz. (8 glasses) of fluid/clear liquids during the day to prevent dehydration and help the prep work efficiently.  CLEAR LIQUIDS INCLUDE: Water Jello Ice Popsicles Tea (sugar ok, no milk/cream) Powdered fruit flavored drinks Coffee (sugar ok, no milk/cream) Gatorade Juice: apple, white grape, white cranberry  Lemonade Clear bullion, consomm, broth Carbonated beverages (any kind) Strained chicken noodle soup Hard Candy                             4.  In the morning, mix first dose of MoviPrep solution:    Empty 1 Pouch A and 1 Pouch B into the disposable container    Add lukewarm drinking water to the top line of the container. Mix to dissolve    Refrigerate (mixed solution should be used within 24 hrs)  5.  Begin drinking the prep at 5:00 p.m. The MoviPrep container is divided by 4 marks.   Every 15 minutes drink the solution down to the next mark (approximately 8 oz) until the full liter is complete.   6.  Follow completed prep with 16 oz of clear liquid of your choice (Nothing red or purple).  Continue  to drink clear liquids until bedtime.  7.  Before going to bed, mix second dose of MoviPrep solution:    Empty 1 Pouch A and 1 Pouch B into the disposable container    Add lukewarm drinking water to the top line of the container. Mix to dissolve    Refrigerate  THE DAY OF YOUR PROCEDURE      DATE: 01/09/2010 DAY: MONDAY  Beginning at 10:00a.m. (5 hours before procedure):         1. Every 15 minutes, drink the solution down to the next mark (approx 8 oz) until the full liter is complete.  2. Follow completed prep with 16 oz. of clear liquid of your choice.    3. You may drink clear liquids until 1PM(2 HOURS BEFORE PROCEDURE).   MEDICATION INSTRUCTIONS  Unless otherwise instructed, you should take regular prescription medications with a small sip of water   as early as possible the morning of your procedure.        OTHER INSTRUCTIONS  You will need a responsible adult at least 60 years of age to accompany you and drive  you home.   This person must remain in the waiting room during your procedure.  Wear loose fitting clothing that is easily removed.  Leave jewelry and other valuables at home.  However, you may wish to bring a book to read or  an iPod/MP3 player to listen to music as you wait for your procedure to start.  Remove all body piercing jewelry and leave at home.  Total time from sign-in until discharge is approximately 2-3 hours.  You should go home directly after your procedure and rest.  You can resume normal activities the  day after your procedure.  The day of your procedure you should not:   Drive   Make legal decisions   Operate machinery   Drink alcohol   Return to work  You will receive specific instructions about eating, activities and medications before you leave.    The above instructions have been reviewed and explained to me by   _______________________    I fully understand and can verbalize these instructions  _____________________________ Date _________

## 2010-05-11 NOTE — Progress Notes (Signed)
Summary: Pain med request  Phone Note Outgoing Call Call back at [phone]   Call placed by: Sid Falcon LPN,  October 14, 8293 1:42 PM Call placed to: Patient Summary of Call: Pt informed of x-ray and he will go now.  Pt asked for something for pain, he did npot sleep much last night due to pain. [facility] [address] Initial call taken by: Sid Falcon LPN,  October 15, 6211 1:43 PM  Follow-up for Phone Call        may call in Vicodin 5/325 mg 1-2 by mouth q4-6 hours as needed pain  #30 with no refills. Follow-up by: Evelena Peat MD,  October 14, 2009 1:49 PM  Additional Follow-up for Phone Call Additional follow up Details #1::        Rx called in, pt informed on VM Additional Follow-up by: Sid Falcon LPN,  October 15, 863 2:26 PM    New/Updated Medications: HYDROCODONE-ACETAMINOPHEN 5-325 MG TABS (HYDROCODONE-ACETAMINOPHEN) one to two tabs by mouth every 4-6 hours as needed pain Prescriptions: HYDROCODONE-ACETAMINOPHEN 5-325 MG TABS (HYDROCODONE-ACETAMINOPHEN) one to two tabs by mouth every 4-6 hours as needed pain  #30 x 0   Entered by:   Sid Falcon LPN   Authorized by:   Evelena Peat MD   Signed by:   Sid Falcon LPN on 78/46/9629   Method used:   Telephoned to ...       [facility]  [address] [phone]* (retail)       [address]       [place], [place]  [address]       Ph: [phone] or [phone]       Fax: [phone]   RxID:   [id number]

## 2010-05-11 NOTE — Assessment & Plan Note (Signed)
Summary: dizziness x 1 week//ccm   Vital Signs:  Patient profile:   60 year old male Weight:      244 pounds Temp:     98.1 degrees F oral BP sitting:   120 / 70  (right arm) Cuff size:   large  Vitals Entered By: Duard Brady LPN (Aug 08, 452 1:02 PM) CC: c/o dizziness in AM , headaches x1week Is Patient Diabetic? No   CC:  c/o dizziness in AM  and headaches x1week.  History of Present Illness: 60 year old patient who has had some mild positional vertigo over the past week, which show worsened today.  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  He describes a feeling of movement and unsteadiness.  For the past few days.  She has had some mild headaches, but no other symptoms.  Denies much in the way of allergy related symptoms.  He is taking no medications.  He does have treated hypertension, but denies any real orthostatic symptoms  Allergies: 1)  Codeine Phosphate (Codeine Phosphate) 2)  Penicillin G Potassium (Penicillin G Potassium)  Past History:  Past Medical History: Reviewed history from 11/16/2008 and no changes required. ED GERD Hypertension history of mumps orchitis rule out OSA  Review of Systems  The patient denies anorexia, fever, weight loss, weight gain, vision loss, decreased hearing, hoarseness, chest pain, syncope, dyspnea on exertion, peripheral edema, prolonged cough, headaches, hemoptysis, abdominal pain, melena, hematochezia, severe indigestion/heartburn, hematuria, incontinence, genital sores, muscle weakness, suspicious skin lesions, transient blindness, difficulty walking, depression, unusual weight change, abnormal bleeding, enlarged lymph nodes, angioedema, breast masses, and testicular masses.    Physical Exam  General:  overweight-appearing.  120/70 without orthostatic changes Head:  Normocephalic and atraumatic without obvious abnormalities. No apparent alopecia or  balding. Eyes:  No corneal or conjunctival inflammation noted. EOMI. Perrla. Funduscopic exam benign, without hemorrhages, exudates or papilledema. Vision grossly normal. Ears:  External ear exam shows no significant lesions or deformities.  Otoscopic examination reveals clear canals, tympanic membranes are intact bilaterally without bulging, retraction, inflammation or discharge. Hearing is grossly normal bilaterally. Mouth:  Oral mucosa and oropharynx without lesions or exudates.  Teeth in good repair. Neck:  No deformities, masses, or tenderness noted. Lungs:  Normal respiratory effort, chest expands symmetrically. Lungs are clear to auscultation, no crackles or wheezes. Heart:  Normal rate and regular rhythm. S1 and S2 normal without gallop, murmur, click, rub or other extra sounds. Neurologic:  alert & oriented X3, cranial nerves II-XII intact, gait normal, and finger-to-nose normal.     Impression & Recommendations:  Problem # 1:  VERTIGO, POSITIONAL (ICD-386.11)  The following medications were removed from the medication list:    Fexofenadine Hcl 180 Mg Tabs (Fexofenadine hcl) .Marland Kitchen... 1 once daily  Problem # 2:  HYPERTENSION (ICD-401.9)  His updated medication list for this problem includes:    Benicar 20 Mg Tabs (Olmesartan medoxomil) ..... One daily  Complete Medication List: 1)  Epipen 0.3 Mg/0.44ml (1:1000) Devi (Epinephrine hcl (anaphylaxis)) 2)  Benicar 20 Mg Tabs (Olmesartan medoxomil) .... One daily 3)  Voltaren-xr 100 Mg Xr24h-tab (Diclofenac sodium) .... Prn  Patient Instructions: 1)  use fenofexadine  or Zyrtec one daily 2)  Please schedule a follow-up appointment as needed.

## 2010-07-13 ENCOUNTER — Other Ambulatory Visit (INDEPENDENT_AMBULATORY_CARE_PROVIDER_SITE_OTHER): Payer: 59

## 2010-07-13 DIAGNOSIS — Z0000 Encounter for general adult medical examination without abnormal findings: Secondary | ICD-10-CM

## 2010-07-13 DIAGNOSIS — Z0389 Encounter for observation for other suspected diseases and conditions ruled out: Secondary | ICD-10-CM

## 2010-07-13 LAB — BASIC METABOLIC PANEL
BUN: 19 mg/dL (ref 6–23)
CO2: 26 mEq/L (ref 19–32)
Chloride: 106 mEq/L (ref 96–112)
Creatinine, Ser: 0.8 mg/dL (ref 0.4–1.5)

## 2010-07-13 LAB — CBC WITH DIFFERENTIAL/PLATELET
Basophils Relative: 0.5 % (ref 0.0–3.0)
Eosinophils Absolute: 0.2 10*3/uL (ref 0.0–0.7)
HCT: 40.2 % (ref 39.0–52.0)
Lymphs Abs: 2.5 10*3/uL (ref 0.7–4.0)
MCHC: 35.1 g/dL (ref 30.0–36.0)
MCV: 92.9 fl (ref 78.0–100.0)
Monocytes Absolute: 0.6 10*3/uL (ref 0.1–1.0)
Neutrophils Relative %: 54.4 % (ref 43.0–77.0)
RBC: 4.33 Mil/uL (ref 4.22–5.81)

## 2010-07-13 LAB — LIPID PANEL
Cholesterol: 161 mg/dL (ref 0–200)
Triglycerides: 80 mg/dL (ref 0.0–149.0)

## 2010-07-13 LAB — URINALYSIS, ROUTINE W REFLEX MICROSCOPIC
Bilirubin Urine: NEGATIVE
Ketones, ur: NEGATIVE
Nitrite: NEGATIVE
Total Protein, Urine: NEGATIVE
Urine Glucose: NEGATIVE

## 2010-07-13 LAB — HEPATIC FUNCTION PANEL
ALT: 16 U/L (ref 0–53)
Total Protein: 6.7 g/dL (ref 6.0–8.3)

## 2010-07-19 ENCOUNTER — Encounter: Payer: Self-pay | Admitting: Internal Medicine

## 2010-07-20 ENCOUNTER — Ambulatory Visit (INDEPENDENT_AMBULATORY_CARE_PROVIDER_SITE_OTHER): Payer: 59 | Admitting: Internal Medicine

## 2010-07-20 VITALS — BP 130/80 | HR 72 | Temp 98.2°F | Ht 68.75 in | Wt 243.0 lb

## 2010-07-20 DIAGNOSIS — Z0000 Encounter for general adult medical examination without abnormal findings: Secondary | ICD-10-CM

## 2010-07-20 MED ORDER — OMEPRAZOLE MAGNESIUM 20 MG PO TBEC: 20.0000 mg | DELAYED_RELEASE_TABLET | Freq: Every day | ORAL | Status: DC

## 2010-07-20 MED ORDER — EPINEPHRINE 0.3 MG/0.3ML IJ DEVI: 0.3000 mg | Freq: Once | INTRAMUSCULAR | Status: AC

## 2010-07-20 MED ORDER — OLMESARTAN MEDOXOMIL 20 MG PO TABS: 20.0000 mg | ORAL_TABLET | Freq: Every day | ORAL | Status: DC

## 2010-07-20 NOTE — Progress Notes (Signed)
Subjective:    Patient ID: David Vincent, male    DOB: September 18, 1950, 60 y.o.   MRN: [id number]  HPI  60 year old patient seen today for an annual exam. He is doing quite well did have a screening colonoscopy that was normal in October of last year. He has done well although will be losing his job in the very near future. He has treated hypertension which has done quite well on Benicar 20 mg daily he has had a history of ACE associated cough.  Past Medical History  Diagnosis Date  . BACK PAIN 03/10/2007  . HYPERTENSION 11/15/2006  . INGUINAL HERNIA, RIGHT 12/05/2009  . LEG CRAMPS 12/03/2008  . SLEEP APNEA 11/16/2008  . VERTIGO, POSITIONAL 08/08/2009  . ED (erectile dysfunction)   . History of mumps orchitis    Past Surgical History  Procedure Date  . Cardiac catheterization     does not have a smoking history on file. He does not have any smokeless tobacco history on file. His alcohol and drug histories not on file. family history is not on file. Allergies  Allergen Reactions  . Codeine Phosphate     REACTION: unspecified  . Penicillins     REACTION: unspecified       History of Present Illness:  60 year old male seen today for an annual physical  Current Allergies:  CODEINE PHOSPHATE (CODEINE PHOSPHATE)  PENICILLIN G POTASSIUM (PENICILLIN G POTASSIUM)  Past Medical History:  ED  GERD  Hypertension  history of mumps orchitis  Past Surgical History:  R knee surgery arthroscopic  2003, negative heart catheterization  Family History:  both parents, history of hypertension. Two brothers two sisters, one sister with hypertension one sister deceased from leukemia at age 64  father died at age 31, hypertension, renal failure  mother history of late onset diabetes  Review of Systems  denies      Review of Systems  Constitutional: Negative for fever, chills, activity change, appetite change and fatigue.  HENT: Negative for hearing loss, ear pain, congestion, rhinorrhea,  sneezing, mouth sores, trouble swallowing, neck pain, neck stiffness, dental problem, voice change, sinus pressure and tinnitus.   Eyes: Negative for photophobia, pain, redness and visual disturbance.  Respiratory: Negative for apnea, cough, choking, chest tightness, shortness of breath and wheezing.   Cardiovascular: Negative for chest pain, palpitations and leg swelling.  Gastrointestinal: Negative for nausea, vomiting, abdominal pain, diarrhea, constipation, blood in stool, abdominal distention, anal bleeding and rectal pain.  Genitourinary: Negative for dysuria, urgency, frequency, hematuria, flank pain, decreased urine volume, discharge, penile swelling, scrotal swelling, difficulty urinating, genital sores and testicular pain.  Musculoskeletal: Negative for myalgias, back pain, joint swelling, arthralgias and gait problem.  Skin: Negative for color change, rash and wound.  Neurological: Negative for dizziness, tremors, seizures, syncope, facial asymmetry, speech difficulty, weakness, light-headedness, numbness and headaches.  Hematological: Negative for adenopathy. Does not bruise/bleed easily.  Psychiatric/Behavioral: Negative for suicidal ideas, hallucinations, behavioral problems, confusion, sleep disturbance, self-injury, dysphoric mood, decreased concentration and agitation. The patient is not nervous/anxious.        Objective:   Physical Exam  Constitutional: He appears well-developed and well-nourished.       Blood pressure 124/80 Moderately overweight  HENT:  Head: Normocephalic and atraumatic.  Right Ear: External ear normal.  Left Ear: External ear normal.  Nose: Nose normal.  Mouth/Throat: Oropharynx is clear and moist.  Eyes: Conjunctivae and EOM are normal. Pupils are equal, round, and reactive to light. No scleral icterus.  Neck:  Normal range of motion. Neck supple. No JVD present. No thyromegaly present.  Cardiovascular: Regular rhythm, normal heart sounds and intact  distal pulses.  Exam reveals no gallop and no friction rub.   No murmur heard. Pulmonary/Chest: Effort normal and breath sounds normal. He exhibits no tenderness.  Abdominal: Soft. Bowel sounds are normal. He exhibits no distension and no mass. There is no tenderness.  Genitourinary: Rectum normal, prostate normal and penis normal. Guaiac negative stool. No penile tenderness.       Testicular atrophy Suggestion of a small left renal hernia  Musculoskeletal: Normal range of motion. He exhibits no edema and no tenderness.  Lymphadenopathy:    He has no cervical adenopathy.  Neurological: He is alert. He has normal reflexes. No cranial nerve deficit. Coordination normal.  Skin: Skin is warm and dry. No rash noted.  Psychiatric: He has a normal mood and affect. His behavior is normal.          Assessment & Plan:   Annual health assessment exam Hypertension stable  We'll continue his present medical regimen. Depending on his new health plan may require a change in his antihypertensive regimen We'll recheck in 6 months or when necessary

## 2010-07-20 NOTE — Patient Instructions (Signed)
Limit your sodium (Salt) intake    It is important that you exercise regularly, at least 20 minutes 3 to 4 times per week.  If you develop chest pain or shortness of breath seek  medical attention.  You need to lose weight.  Consider a lower calorie diet and regular exercise.  Return in 6 months for follow-up   

## 2010-07-21 ENCOUNTER — Encounter: Payer: Self-pay | Admitting: Internal Medicine

## 2010-08-25 NOTE — H&P (Signed)
NAME:  David Vincent, David Vincent                        ACCOUNT NO.:  [id number]   MEDICAL RECORD NO.:  [id number]                   PATIENT TYPE:  INP   LOCATION:  3309                                 FACILITY:  [facility]   PHYSICIAN:  Duke Salvia, M.D. [facility]           DATE OF BIRTH:  [birth year]   DATE OF ADMISSION:  01/04/2002  DATE OF DISCHARGE:  01/05/2002                                HISTORY & PHYSICAL   REASON FOR ADMISSION:  The patient is admitted to the hospital because of  chest pain and positive enzymes.   HISTORY OF PRESENT ILLNESS:  The patient is a 60 year old gentleman with no  past cardiac history and cardiac risk factors notable for no diagnosis of  diabetes or family history or cigarette use. His cholesterol status is not  known. Over the last  two to three months he has had exertional chest  tightness with radiation to his jaw and his left arm. This was associated  with a very severe episode about two to three weeks ago, which persisted  for about 20 to 30 minutes. It then resolved. Since that time he has had  periods more frequently of discomfort associated with shortness of breath  and diaphoresis.   This morning he awakened at 2 o'clock with similar discomfort of someone  sitting on his chest and burning up through  his central sternal area with  radiation to the left arm. He took Tums. He became quite dyspneic and  choking and was diaphoretic and he and his wife came to the hospital. He was  given aspirin, oxygen and nitroglycerin with resolution of his discomfort.  He also received Lopressor and a GI cocktail. Enzymes demonstrated a  troponin of 0.69; his CK was normal. At this time he has had recurrent  abdominal epigastric discomfort with some radiation into his sternum.   PAST MEDICAL HISTORY:  His past medical history is largely negative as noted  above.  He has had some light knee surgery.   MEDICATIONS:  He takes no regular medications.   ALLERGIES:  HE IS  ALLERGIC TO PENICILLIN AND TO CODEINE, BOTH OF WHICH CAUSE  HIM TO SWELL.   REVIEW OF SYSTEMS:  Noted on the intake sheet and is remarkable for some GE  reflux symptoms. He has also gained a considerable amount of weight over the  last couple of years.   PHYSICAL EXAMINATION:  GENERAL:  He is a white male in no acute distress.  VITAL SIGNS:  Blood pressure 155/88, pulse 67, respiratory rate 18 and  unlabored.  HEENT:  Demonstrated no scleral icterus or xanthoma.  NECK:  The neck veins were flat. The carotids were brisk and full  bilaterally without bruits. There is no lymphadenopathy.  BACK:  Without kyphosis, scoliosis.  LUNGS:  Clear.  HEART:  The heart  sounds were regular with an S4. There is no murmur.  ABDOMEN:  Soft with active  bowel sounds without midline pulsation.  EXTREMITIES:  Femoral pulses were 2+, distal pulses were intact. There is no  cyanosis, clubbing or edema.  NEUROLOGIC:  The neurological exam was grossly normal.  SKIN:  Warm and dry.   LABORATORY DATA:  His electrocardiogram at 5:04 this morning demonstrated  sinus rhythm at 69 with intervals of 0.13, 0.08 and 0.37 and an axis of 28  degrees. The electrocardiogram  was normal.   Laboratories were notable in addition to the troponin of 0.069 for a  potassium of 3.3, a blood sugar of 126, a normal hemogram.   IMPRESSION:  1. Acute coronary syndrome, NSTEMI.  2. Cardiac risk factors, negative, hypertension negative, diabetes negative,     family history negative. Question cholesterol.  3. Hypokalemia and borderline elevated  blood sugar today and borderline     elevated systolic blood pressure.   The patient has an  acute coronary syndrome. He has been randomized as part  of the [facility] trial to receive intravenous heparin. In addition with his  positive enzymes, we will add Integrilin and Plavix to his aspirin. We will  check a fasting lipid profile and begin statin therapy, and also for early  hazard  benefit and the unclear status of  enzymes, we will begin him on low  dose ACE inhibitors. I have reviewed with his family the potential concerns  and the need for catheterization.                                               Duke Salvia, M.D. [facility]    SCK/MEDQ  D:  01/04/2002  T:  01/06/2002  Job:  [id number]

## 2010-08-25 NOTE — Assessment & Plan Note (Signed)
[facility]                            [facility] OFFICE NOTE   NAME:POWELLRodell, Marrs                     MRN:          [id number]  DATE:07/29/2006                            DOB:          [birth year]    A 60 year old gentleman seen today with a chief complaint of low back  pain.  This began over the past weekend when he was doing some heavy  housework requiring a lot of bending and stooping and squatting.  He  also has a persistent rash involving primarily his arms.  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  He had the onset of the rash about the  time he was placed on hydrochlorothiazide, however.  Examination  revealed tenderness along the lumbar area bilaterally.  Straight leg  testing was negative.  Flexion of the hips did aggravate the back pain.   IMPRESSION:  1. Muscle ligamentous strain.  2. Hypertension.  3. Dermatitis.  Unclear whether this is a contact dermatitis versus      possibly related to thiazide diuretic.   DISPOSITION:  The hydrochlorothiazide will be held.  He will be placed  on Benicar 20 mg daily for a month, then reassessed at that time.  He  will be treated with Aleve, Skelaxin, and tramadol for his back pain.     Gordy Savers, MD  Electronically Signed    PFK/MedQ  DD: 07/29/2006  DT: 07/30/2006  Job #: [id number]

## 2010-08-25 NOTE — Discharge Summary (Signed)
   NAME:  David Vincent, David Vincent                        ACCOUNT NO.:  [id number]   MEDICAL RECORD NO.:  [id number]                   PATIENT TYPE:  INP   LOCATION:  3309                                 FACILITY:  [facility]   PHYSICIAN:  Duke Salvia, M.D. [facility]           DATE OF BIRTH:  [birth year]   DATE OF ADMISSION:  01/04/2002  DATE OF DISCHARGE:  01/05/2002                                 DISCHARGE SUMMARY   The patient is a 60 year old white married male who was putting in a new  floor and suddenly became short of breath and hot and developed some chest  heaviness radiating to the left arm.  He came on to the emergency room and  was admitted for further evaluation and therapy.   CLINICAL FINDINGS:  Hemoglobin 13.1 with hematocrit of 38.2 and a white  count of 7800.  CKs were low.  Troponin was 0.57.  Hemoglobin A1c was 4.8.  Electrolytes and renal function were normal.  Total cholesterol was 169,  with triglyceride of 143, HDL at 41, and LDL of 99.  CKs were low x3.   COURSE IN HOSPITAL:  The patient was admitted and his troponins were  slightly up, but his MBs were normal.  We were worried that he was having  acute coronary syndrome and treated him with Integrilin, heparin, Plavix,  and aspirin.  He underwent cardiac catheterization on September 29 by Dr.  Chales Abrahams and was found to have normal coronary arteries and normal LV function.  He was begun on Nexium 40 mg q.d.  He was discharged home later that  evening.  He will follow up with Dr. Amador Cunas long-term.   FINAL DIAGNOSES:  1. Chest pain -- noncardiac.  2. Normal coronary arteries per angiography this admission.  3. Probable gastroesophageal reflux disease.   DISPOSITION:  Patient discharged home on Nexium 40 mg q.d. and will follow  up with Amador Cunas long-term.     Dian Queen, P.A. [facility]                     Duke Salvia, M.D. [facility]    BY/MEDQ  D:  01/05/2002  T:  01/08/2002  Job:  [id number]   cc:   Gordy Savers,  M.D. [facility]

## 2010-08-25 NOTE — Cardiovascular Report (Signed)
NAME:  David Vincent, David Vincent                        ACCOUNT NO.:  [id number]   MEDICAL RECORD NO.:  [id number]                   PATIENT TYPE:  INP   LOCATION:  3309                                 FACILITY:  [facility]   PHYSICIAN:  Veneda Melter, M.D. [facility]               DATE OF BIRTH:  03-09-1951   DATE OF PROCEDURE:  01/05/2002  DATE OF DISCHARGE:                              CARDIAC CATHETERIZATION   PROCEDURE PERFORMED:  1. Left heart catheterization.  2. Left ventriculogram.  3. Selectively coronary angiography.  4. Failed attempted Perclose to right femoral artery.   DIAGNOSES:  1. Mild coronary artery disease by angiogram.  2. Normal left ventricular systolic function.   HISTORY:  The patient is a 60 year old gentleman who presents with severe  substernal chest discomfort.  The patient had no acute ECG changes but he  had mild elevation of cardiac troponin enzymes.  He is referred for further  assessment.   TECHNIQUE:  Informed consent was obtained.  The patient was brought to the  catheterization lab.  A #6 French sheath was placed in the right femoral  artery.  The #6 [place] and JR4 catheters were then used to engage the  left and right coronary arteries and selective angiography performed in  various projections using manual injections of contrast.  A #6 French  pigtail catheter was then advanced to the left ventricle and a left  ventriculogram performed using power injections of contrast.  At the  termination of the case, the Perclose suture closure device was deployed to  the right femoral artery; however, adequate hemostasis could not be achieved  and manual pressure was applied.  Hemostasis was achieved.  The patient was  transferred to the holding area in stable condition.  He tolerated the  procedure well.  Findings are as follows:   FINDINGS:  1. Left main trunk:  Large caliber vessel, angiographically normal.  2. LAD:  This is a large caliber vessel that provides  four diagonal     branches.  The LAD has mild irregularities of 20% in the proximal and mid     section.  3. Left circumflex artery:  Large caliber vessel that provides two marginal     branches.  The left circumflex system has mild irregularities.  4. Ramus intermedius:  This is a small caliber vessel that bifurcates in its     proximal segment.  It has moderate disease of 30-40% at its origin.  5. Right coronary artery:  Dominant.  This is a large caliber vessel that     provides a posterior descending artery and two posterior ventricular     branches in its terminal segment.  The right coronary artery has mild     irregularities.  The distal branch vessels have luminal disease as well.  6. LV:  Normal end systolic and end diastolic dimensions.  Overall left     ventricular function  is well preserved.  Ejection fraction of greater     than 55%.  No mitral regurgitation.   PRESSURES:  1. LV pressure is 120/10.  2. Aortic is 120/70.  3. LVEDP of 20.   ASSESSMENT/PLAN:  The patient is a 60 year old gentleman with noncritical  coronary artery disease and normal LV function.  Risk factor modification  will be pursued and other causes of his chest pain investigated.                                               Veneda Melter, M.D. [facility]    NG/MEDQ  D:  01/05/2002  T:  01/06/2002  Job:  [id number]   cc:   Gordy Savers, M.D. [facility]   Rollene Rotunda, M.D. [facility]  [address]  [place]  [place] [address]  Fax: 1

## 2010-09-28 ENCOUNTER — Ambulatory Visit (INDEPENDENT_AMBULATORY_CARE_PROVIDER_SITE_OTHER): Payer: 59 | Admitting: Internal Medicine

## 2010-09-28 ENCOUNTER — Encounter: Payer: Self-pay | Admitting: Internal Medicine

## 2010-09-28 DIAGNOSIS — H8110 Benign paroxysmal vertigo, unspecified ear: Secondary | ICD-10-CM

## 2010-09-28 DIAGNOSIS — I10 Essential (primary) hypertension: Secondary | ICD-10-CM

## 2010-09-28 MED ORDER — MECLIZINE HCL 25 MG PO TABS: 25.0000 mg | ORAL_TABLET | Freq: Three times a day (TID) | ORAL | Status: DC | PRN

## 2010-09-28 NOTE — Progress Notes (Signed)
  Subjective:    Patient ID: David Vincent, male    DOB: [birth year], 60 y.o.   MRN: [id number]  HPI  60 year old patient who presents with a 2 to three-day history of positional vertigo he has had this in the past. Symptoms are aggravated by positional change. He describes it true vertigo with a spinning sensation that lasts several seconds;  these symptoms are associated with some nausea. No fever or URI symptoms. He does have hypertension treated with Benicar 20 mg daily   Review of Systems  Constitutional: Negative for fever, chills, appetite change and fatigue.  HENT: Negative for hearing loss, ear pain, congestion, sore throat, trouble swallowing, neck stiffness, dental problem, voice change and tinnitus.   Eyes: Negative for pain, discharge and visual disturbance.  Respiratory: Negative for cough, chest tightness, wheezing and stridor.   Cardiovascular: Negative for chest pain, palpitations and leg swelling.  Gastrointestinal: Negative for nausea, vomiting, abdominal pain, diarrhea, constipation, blood in stool and abdominal distention.  Genitourinary: Negative for urgency, hematuria, flank pain, discharge, difficulty urinating and genital sores.  Musculoskeletal: Negative for myalgias, back pain, joint swelling, arthralgias and gait problem.  Skin: Negative for rash.  Neurological: Negative for dizziness, syncope, speech difficulty, weakness, numbness and headaches.  Hematological: Negative for adenopathy. Does not bruise/bleed easily.  Psychiatric/Behavioral: Negative for behavioral problems and dysphoric mood. The patient is not nervous/anxious.        Objective:   Physical Exam  Constitutional: He is oriented to person, place, and time. He appears well-developed.       Blood pressure 120/80 sitting without orthostatic change  HENT:  Head: Normocephalic.  Right Ear: External ear normal.  Left Ear: External ear normal.  Eyes: Conjunctivae and EOM are normal.  Neck: Normal  range of motion.  Cardiovascular: Normal rate and normal heart sounds.   Pulmonary/Chest: Breath sounds normal.  Abdominal: Bowel sounds are normal.  Musculoskeletal: Normal range of motion. He exhibits no edema and no tenderness.  Neurological: He is alert and oriented to person, place, and time.  Psychiatric: He has a normal mood and affect. His behavior is normal.          Assessment & Plan:   Benign positional vertigo Hypertension well controlled no evidence of orthostatic hypotension  We'll treat with meclizine and observe

## 2010-09-28 NOTE — Patient Instructions (Signed)
Call or return to clinic prn if these symptoms worsen or fail to improve as anticipated.

## 2011-05-02 ENCOUNTER — Emergency Department (HOSPITAL_COMMUNITY): Payer: 59

## 2011-05-02 ENCOUNTER — Other Ambulatory Visit: Payer: Self-pay

## 2011-05-02 ENCOUNTER — Encounter (HOSPITAL_COMMUNITY): Payer: Self-pay | Admitting: Emergency Medicine

## 2011-05-02 ENCOUNTER — Observation Stay (HOSPITAL_COMMUNITY)
Admission: EM | Admit: 2011-05-02 | Discharge: 2011-05-03 | Disposition: A | Discharge status: Home / Self Care | Payer: 59 | Source: Ambulatory Visit | Attending: Internal Medicine | Admitting: Internal Medicine

## 2011-05-02 DIAGNOSIS — I200 Unstable angina: Secondary | ICD-10-CM

## 2011-05-02 DIAGNOSIS — K219 Gastro-esophageal reflux disease without esophagitis: Secondary | ICD-10-CM

## 2011-05-02 DIAGNOSIS — I10 Essential (primary) hypertension: Secondary | ICD-10-CM | POA: Insufficient documentation

## 2011-05-02 DIAGNOSIS — I2510 Atherosclerotic heart disease of native coronary artery without angina pectoris: Secondary | ICD-10-CM | POA: Insufficient documentation

## 2011-05-02 DIAGNOSIS — R0602 Shortness of breath: Secondary | ICD-10-CM | POA: Insufficient documentation

## 2011-05-02 DIAGNOSIS — G4730 Sleep apnea, unspecified: Secondary | ICD-10-CM | POA: Insufficient documentation

## 2011-05-02 DIAGNOSIS — R079 Chest pain, unspecified: Principal | ICD-10-CM | POA: Insufficient documentation

## 2011-05-02 HISTORY — DX: Gastro-esophageal reflux disease without esophagitis: K21.9

## 2011-05-02 HISTORY — DX: Atherosclerotic heart disease of native coronary artery without angina pectoris: I25.10

## 2011-05-02 HISTORY — DX: Unstable angina: I20.0

## 2011-05-02 LAB — POCT I-STAT TROPONIN I: Troponin i, poc: 0 ng/mL (ref 0.00–0.08)

## 2011-05-02 LAB — CBC
Hemoglobin: 14.5 g/dL (ref 13.0–17.0)
MCH: 31.9 pg (ref 26.0–34.0)
MCHC: 35.4 g/dL (ref 30.0–36.0)
Platelets: 254 10*3/uL (ref 150–400)
RBC: 4.54 MIL/uL (ref 4.22–5.81)

## 2011-05-02 LAB — DIFFERENTIAL
Basophils Relative: 1 % (ref 0–1)
Eosinophils Absolute: 0.3 10*3/uL (ref 0.0–0.7)
Lymphs Abs: 3 10*3/uL (ref 0.7–4.0)
Monocytes Relative: 10 % (ref 3–12)
Neutro Abs: 4.4 10*3/uL (ref 1.7–7.7)
Neutrophils Relative %: 51 % (ref 43–77)

## 2011-05-02 LAB — CARDIAC PANEL(CRET KIN+CKTOT+MB+TROPI)
CK, MB: 1.9 ng/mL (ref 0.3–4.0)
CK, MB: 1.8 ng/mL (ref 0.3–4.0)
Total CK: 72 U/L (ref 7–232)

## 2011-05-02 LAB — LIPASE, BLOOD: Lipase: 27 U/L (ref 11–59)

## 2011-05-02 LAB — HEPATIC FUNCTION PANEL
AST: 21 U/L (ref 0–37)
Bilirubin, Direct: <0.1 mg/dL (ref 0.0–0.3)

## 2011-05-02 LAB — APTT: aPTT: 27 seconds (ref 24–37)

## 2011-05-02 LAB — AMYLASE: Amylase: 44 U/L (ref 0–105)

## 2011-05-02 LAB — BASIC METABOLIC PANEL
Chloride: 102 mEq/L (ref 96–112)
GFR calc Af Amer: >90 mL/min (ref >90)
GFR calc non Af Amer: 88 mL/min — ABNORMAL LOW (ref >90)
Potassium: 3.8 mEq/L (ref 3.5–5.1)
Sodium: 138 mEq/L (ref 135–145)

## 2011-05-02 LAB — POCT I-STAT, CHEM 8
Calcium, Ion: 1.21 mmol/L (ref 1.12–1.32)
HCT: 43 % (ref 39.0–52.0)
Hemoglobin: 14.6 g/dL (ref 13.0–17.0)
Sodium: 143 mEq/L (ref 135–145)
TCO2: 27 mmol/L (ref 0–100)

## 2011-05-02 LAB — PROTIME-INR
INR: 1.06 (ref 0.00–1.49)
Prothrombin Time: 14 seconds (ref 11.6–15.2)

## 2011-05-02 MED ORDER — SODIUM CHLORIDE 0.9 % IJ SOLN
3.0000 mL | Freq: Two times a day (BID) | INTRAMUSCULAR | Status: DC
  Administered 2011-05-02 – 2011-05-03 (×3): 3 mL via INTRAVENOUS

## 2011-05-02 MED ORDER — ASPIRIN EC 81 MG PO TBEC
81.0000 mg | DELAYED_RELEASE_TABLET | Freq: Every day | ORAL | Status: DC
  Administered 2011-05-03: 81 mg via ORAL
  Filled 2011-05-02: qty 1

## 2011-05-02 MED ORDER — OLMESARTAN MEDOXOMIL 20 MG PO TABS
20.0000 mg | ORAL_TABLET | Freq: Every day | ORAL | Status: DC
  Administered 2011-05-02 – 2011-05-03 (×2): 20 mg via ORAL
  Filled 2011-05-02 (×2): qty 1

## 2011-05-02 MED ORDER — SODIUM CHLORIDE 0.9 % IV SOLN
Freq: Once | INTRAVENOUS | Status: AC
  Administered 2011-05-02: 75 mL/h via INTRAVENOUS

## 2011-05-02 MED ORDER — ALPRAZOLAM 0.25 MG PO TABS: 0.2500 mg | ORAL_TABLET | Freq: Two times a day (BID) | ORAL | Status: DC | PRN

## 2011-05-02 MED ORDER — SODIUM CHLORIDE 0.9 % IJ SOLN: 3.0000 mL | INTRAMUSCULAR | Status: DC | PRN

## 2011-05-02 MED ORDER — SODIUM CHLORIDE 0.9 % IV SOLN
250.0000 mL | INTRAVENOUS | Status: DC | PRN
Start: 2011-05-02 — End: 2011-05-03

## 2011-05-02 MED ORDER — ONDANSETRON HCL 4 MG/2ML IJ SOLN: 4.0000 mg | Freq: Four times a day (QID) | INTRAMUSCULAR | Status: DC | PRN

## 2011-05-02 MED ORDER — GI COCKTAIL ~~LOC~~
30.0000 mL | Freq: Once | ORAL | Status: AC
  Administered 2011-05-02: 30 mL via ORAL
  Filled 2011-05-02: qty 30

## 2011-05-02 MED ORDER — NITROGLYCERIN 0.4 MG SL SUBL: 0.4000 mg | SUBLINGUAL_TABLET | SUBLINGUAL | Status: DC | PRN

## 2011-05-02 MED ORDER — ZOLPIDEM TARTRATE 5 MG PO TABS: 10.0000 mg | ORAL_TABLET | Freq: Every evening | ORAL | Status: DC | PRN

## 2011-05-02 MED ORDER — NITROGLYCERIN 0.4 MG SL SUBL
0.4000 mg | SUBLINGUAL_TABLET | SUBLINGUAL | Status: DC | PRN
  Administered 2011-05-02 (×2): 0.4 mg via SUBLINGUAL
  Filled 2011-05-02: qty 75

## 2011-05-02 MED ORDER — PANTOPRAZOLE SODIUM 40 MG PO TBEC
40.0000 mg | DELAYED_RELEASE_TABLET | Freq: Every day | ORAL | Status: DC
  Administered 2011-05-02 – 2011-05-03 (×2): 40 mg via ORAL
  Filled 2011-05-02 (×2): qty 1

## 2011-05-02 MED ORDER — ASPIRIN 81 MG PO CHEW
324.0000 mg | CHEWABLE_TABLET | Freq: Once | ORAL | Status: AC
  Administered 2011-05-02: 324 mg via ORAL
  Filled 2011-05-02: qty 4

## 2011-05-02 MED ORDER — OMEPRAZOLE MAGNESIUM 20 MG PO TBEC: 20.0000 mg | DELAYED_RELEASE_TABLET | Freq: Every day | ORAL | Status: DC

## 2011-05-02 MED ORDER — ACETAMINOPHEN 325 MG PO TABS
650.0000 mg | ORAL_TABLET | ORAL | Status: DC | PRN
  Administered 2011-05-02 – 2011-05-03 (×3): 650 mg via ORAL
  Filled 2011-05-02 (×3): qty 2

## 2011-05-02 NOTE — ED Notes (Signed)
Pt. Received from triage via w/c with c/o chest pain, pt. Alert and oriented, NAD noted. Dr. Hyacinth Meeker to the bedside

## 2011-05-02 NOTE — ED Notes (Signed)
PT. REPORTS LEFT CHEST PAIN RADIATING TO LEFT ARM WITH SOB AND DRY COUGH ONSET THIS MORNING , DENIES NAUSEA , VOMITTING OR DIAPHORESIS. NO MEDS PTA.

## 2011-05-02 NOTE — ED Notes (Signed)
Pt states that his chest pain has improved. He continues to have pain in his left arm. Pt states that he feels like some of his symptoms could be relates to stress that he is feeling at home. He is recently unemployed and his wife is as well. He is currently helping take care of his mother in law and his son, who recently had surgery. Pt states that he would like to find resources that could help him better manage his stress.

## 2011-05-02 NOTE — H&P (Signed)
Patient ID: David Vincent MRN: [id number], DOB/AGE: 1950/10/08   Admit date: 05/02/2011   Primary Physician: Rogelia Boga, MD, MD Primary Cardiologist: NEW - prev seen by Dr. Chales Abrahams  Pt. Profile:   61 year old male with prior history of chest pain status post nonobstructive cardiac catheterization in 2003 who presents with recurrent chest pain.    Problem List: Past Medical History  Diagnosis Date  . BACK PAIN 03/10/2007  . HYPERTENSION 11/15/2006  . INGUINAL HERNIA, RIGHT 12/05/2009  . LEG CRAMPS 12/03/2008  . SLEEP APNEA 11/16/2008  . VERTIGO, POSITIONAL 08/08/2009  . ED (erectile dysfunction)   . History of mumps orchitis   . GERD (gastroesophageal reflux disease)   . CAD (coronary artery disease)     A.  01/05/2002 Cath - nonobs dzs (LAD 20prox, RI 30-40 prox)  . Unstable angina     Past Surgical History  Procedure Date  . Cardiac catheterization   . Knee arthroscopy     Right     Allergies:  Allergies  Allergen Reactions  . Codeine Phosphate     REACTION: unspecified  . Penicillins     REACTION: unspecified    HPI:   61 year old male with the above problem list. Patient has a history of chest pain and underwent diagnostic catheterization 2003 revealing nonobstructive coronary artery disease. Patient subsequently diagnosed with GERD and has been maintained on PPI therapy at home. Is not very active at home but does relatively well. Patient was in his usual state of health when he awoke at approximately 3 AM with 8/10 substernal chest pressure and heaviness with radiation to the left arm. He denies associated nausea, vomiting, presyncope, or lightheadedness. He got up and took Maalox without relief and then drank a Coca-Cola hoping that would help him to burp but this provided no relief either. At that point he presented to the [facility] ED where he was treated with 3 sublingual nitroglycerin glycerin tablets without relief of symptoms. He was subsequently  given GI cocktail and chest pain abated at approximately 5 AM (total of 2 hours duration). Here, his ECG shows nonspecific T. flattening in the anterior leads but otherwise no acute ST or T changes. His troponins have been normal.   Home Medications Medications Prior to Admission - GIVEN IN ED  Medication Dose Route Frequency Provider Last Rate Last Dose  . 0.9 %  sodium chloride infusion   Intravenous Once Vida Roller, MD 75 mL/hr at 05/02/11 0521 75 mL/hr at 05/02/11 0521  . aspirin chewable tablet 324 mg  324 mg Oral Once Vida Roller, MD   324 mg at 05/02/11 0525  . gi cocktail  30 mL Oral Once Vida Roller, MD   30 mL at 05/02/11 0552  . nitroGLYCERIN (NITROSTAT) SL tablet 0.4 mg  0.4 mg Sublingual Q5 Min x 3 PRN Vida Roller, MD   0.4 mg at 05/02/11 0535   Medications Prior to Admission - HOME MEDS  Medication Sig Dispense Refill  . olmesartan (BENICAR) 20 MG tablet Take 1 tablet (20 mg total) by mouth daily.  90 tablet  6  . omeprazole (PRILOSEC OTC) 20 MG tablet Take 1 tablet (20 mg total) by mouth daily.  90 tablet  6     Family History  Problem Relation Age of Onset  . Heart attack Father     died 38  . Pneumonia Mother     died 42 - hip fx/pna  . Hypertension Sister  alive  . Cancer Brother     died - agent orange exposure  . Hypertension Brother     alive    History   Social History  . Marital Status: Married    Spouse Name: N/A    Number of Children: N/A  . Years of Education: N/A   Occupational History  . Not on file.   Social History Main Topics  . Smoking status: Never Smoker   . Smokeless tobacco: Not on file  . Alcohol Use: No  . Drug Use: No  . Sexually Active: Not on file   Other Topics Concern  . Not on file   Social History Narrative   05/02/2011:  Currently unemployed.  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  Does not exercise or adhere to any specific diet.     Review of Systems: General: negative for  chills, fever, night sweats or weight changes.  Cardiovascular: Positive for chest pain with left arm pain.  No dyspnea on exertion, edema, orthopnea, palpitations, paroxysmal nocturnal dyspnea. Dermatological: negative for rash Respiratory: negative for cough or wheezing Urologic: negative for hematuria Abdominal: He has recently had constipation however this morning had 2 spells of diarrhea.  Negative for nausea, vomiting, bright red blood per rectum, melena, or hematemesis Neurologic: negative for visual changes, syncope, or dizziness All other systems reviewed and are otherwise negative except as noted above.  Physical Exam: Blood pressure 133/79, pulse 72, temperature 98.3 F (36.8 C), temperature source Oral, resp. rate 14, SpO2 99.00%.  General: Well developed, well nourished, in no acute distress. Head: Normocephalic, atraumatic, sclera non-icteric, no xanthomas, nares are without discharge.  Neck: Supple without bruits or JVD. Lungs:  Resp regular and unlabored, CTA. Heart: RRR no s3, s4, or murmurs. Abdomen: Soft, non-tender, non-distended, BS + x 4.  Msk:  Strength and tone appears normal for age. Extremities: No clubbing, cyanosis or edema. DP/PT/Radials 2+ and equal bilaterally. Neuro: Alert and oriented X 3. Moves all extremities spontaneously. Psych: Normal affect.  Labs:   Results for orders placed during the hospital encounter of 05/02/11 (from the past 72 hour(s))  CBC     Status: Normal   Collection Time   05/02/11  5:19 AM      Component Value Range Comment   WBC 8.5  4.0 - 10.5 (K/uL)    RBC 4.54  4.22 - 5.81 (MIL/uL)    Hemoglobin 14.5  13.0 - 17.0 (g/dL)    HCT 66.4  40.3 - 47.4 (%)    MCV 90.3  78.0 - 100.0 (fL)    MCH 31.9  26.0 - 34.0 (pg)    MCHC 35.4  30.0 - 36.0 (g/dL)    RDW 25.9  56.3 - 87.5 (%)    Platelets 254  150 - 400 (K/uL)   DIFFERENTIAL     Status: Normal   Collection Time   05/02/11  5:19 AM      Component Value Range Comment    Neutrophils Relative 51  43 - 77 (%)    Neutro Abs 4.4  1.7 - 7.7 (K/uL)    Lymphocytes Relative 35  12 - 46 (%)    Lymphs Abs 3.0  0.7 - 4.0 (K/uL)    Monocytes Relative 10  3 - 12 (%)    Monocytes Absolute 0.9  0.1 - 1.0 (K/uL)    Eosinophils Relative 3  0 - 5 (%)    Eosinophils Absolute 0.3  0.0 -  0.7 (K/uL)    Basophils Relative 1  0 - 1 (%)    Basophils Absolute 0.1  0.0 - 0.1 (K/uL)   BASIC METABOLIC PANEL     Status: Abnormal   Collection Time   05/02/11  5:19 AM      Component Value Range Comment   Sodium 138  135 - 145 (mEq/L)    Potassium 3.8  3.5 - 5.1 (mEq/L)    Chloride 102  96 - 112 (mEq/L)    CO2 27  19 - 32 (mEq/L)    Glucose, Bld 98  70 - 99 (mg/dL)    BUN 19  6 - 23 (mg/dL)    Creatinine, Ser 1.61  0.50 - 1.35 (mg/dL)    Calcium 9.4  8.4 - 10.5 (mg/dL)    GFR calc non Af Amer 88 (*) >90 (mL/min)    GFR calc Af Amer >90  >90 (mL/min)   APTT     Status: Normal   Collection Time   05/02/11  5:19 AM      Component Value Range Comment   aPTT 27  24 - 37 (seconds)   PROTIME-INR     Status: Normal   Collection Time   05/02/11  5:19 AM      Component Value Range Comment   Prothrombin Time 14.0  11.6 - 15.2 (seconds)    INR 1.06  0.00 - 1.49    POCT I-STAT TROPONIN I     Status: Normal   Collection Time   05/02/11  5:34 AM      Component Value Range Comment   Troponin i, poc 0.00  0.00 - 0.08 (ng/mL)    Comment 3            POCT I-STAT, CHEM 8     Status: Normal   Collection Time   05/02/11  5:36 AM      Component Value Range Comment   Sodium 143  135 - 145 (mEq/L)    Potassium 3.9  3.5 - 5.1 (mEq/L)    Chloride 105  96 - 112 (mEq/L)    BUN 21  6 - 23 (mg/dL)    Creatinine, Ser 0.96  0.50 - 1.35 (mg/dL)    Glucose, Bld 99  70 - 99 (mg/dL)    Calcium, Ion 0.45  1.12 - 1.32 (mmol/L)    TCO2 27  0 - 100 (mmol/L)    Hemoglobin 14.6  13.0 - 17.0 (g/dL)    HCT 40.9  81.1 - 91.4 (%)      Radiology/Studies: Dg Chest Port 1 View  05/02/2011  *RADIOLOGY REPORT*   Clinical Data: Left-sided chest pain; left shoulder and arm pain.  PORTABLE CHEST - 1 VIEW  Comparison: None.  Findings: The lungs are well-aerated and clear.  There is no evidence of focal opacification, pleural effusion or pneumothorax.  The cardiomediastinal silhouette is borderline normal in size.  No acute osseous abnormalities are seen.  IMPRESSION: No acute cardiopulmonary process seen.  Original Report Authenticated By: Tonia Ghent, M.D.    EKG:  Regular sinus rhythm, 79, no acute ST or T changes.  ASSESSMENT AND PLAN:   1. Chest pain: Patient presents following an episode of substernal chest pressure with left arm radiation somewhat concerning for angina. Symptoms not significant changed with nitrates however did improve with a GI cocktail. At this point patient has no objective evidence of ischemia the we will plan to admit him for further evaluation and formal rule out. Provided that  enzymes remain negative we can likely plan for early discharge and outpatient stress testing. If he rules in, but plan on heart catheterization. We'll continue his home dose of PPI and add low-dose aspirin at this time.  2. Hypertension: Stable. Continue home dose of Benicar.  3. Lipids: He had an LDL of 108 in April 2012. Will followup lipids and LFTs here.  4. GERD: Question contribution to symptoms. Continue PPI. He has been seen by Dr. Marina Goodell in the past.  Signed, Nicolasa Ducking, NP 05/02/2011, 7:35 AM   I have seen, examined the patient, and reviewed the above assessment and plan. Briefly, 61 yo sedentary WM presents with chest pain.  By history, his pain is somewhat atypical.  Presently, he has left shoulder/ arm pain which is clearly worsened with arm movement and palpation of the deltopectoral grove area.  I suspect that this is musculoskeletal in etiology.  EKG is without ischemic changes.  CXR/Labs are reviewed. Will admit to rule out MI. If CMs are negative and chest pain resolves, would  plan discharge in am with outpatient myoview.  Co Sign: Hillis Range, MD 05/02/2011 8:18 AM

## 2011-05-02 NOTE — ED Provider Notes (Signed)
History     CSN: [id number]  Arrival date & time 05/02/11  0457   First MD Initiated Contact with Patient 05/02/11 0503      Chief Complaint  Patient presents with  . Chest Pain    (Consider location/radiation/quality/duration/timing/severity/associated sxs/prior treatment) HPI Comments: 61 year old male with a history of hypertension and family history of heart disease percent with the complaint of chest pain. This awoke him from sleep approximately 3 AM, is a severe pulling his chest that has been persistent, waxes and wanes in intensity and has been associated with mild shortness of breath. He denies fevers chills nausea vomiting diarrhea swelling in the legs, lashes, headache, blurry vision. This is something that he has never had before, he took antacid prior to arrival without improvement  and has not seen his family doctor for similar. He does remember a stress test a number of years ago because he was having chest pain. Symptoms at this time are moderate.  This pain radiates to the left arm.  Patient is a 61 y.o. male presenting with chest pain. The history is provided by the patient and medical records.  Chest Pain     Past Medical History  Diagnosis Date  . BACK PAIN 03/10/2007  . HYPERTENSION 11/15/2006  . INGUINAL HERNIA, RIGHT 12/05/2009  . LEG CRAMPS 12/03/2008  . SLEEP APNEA 11/16/2008  . VERTIGO, POSITIONAL 08/08/2009  . ED (erectile dysfunction)   . History of mumps orchitis   . GERD (gastroesophageal reflux disease)     Past Surgical History  Procedure Date  . Cardiac catheterization     No family history on file.  History  Substance Use Topics  . Smoking status: Never Smoker   . Smokeless tobacco: Not on file  . Alcohol Use: No      Review of Systems  Cardiovascular: Positive for chest pain.  All other systems reviewed and are negative.    Allergies  Codeine phosphate and Penicillins  Home Medications   Current Outpatient Rx  Name Route Sig  Dispense Refill  . MECLIZINE HCL 25 MG PO TABS Oral Take 1 tablet (25 mg total) by mouth 3 (three) times daily as needed for dizziness or nausea. 30 tablet 1  . OLMESARTAN MEDOXOMIL 20 MG PO TABS Oral Take 1 tablet (20 mg total) by mouth daily. 90 tablet 6  . OMEPRAZOLE MAGNESIUM 20 MG PO TBEC Oral Take 1 tablet (20 mg total) by mouth daily. 90 tablet 6    BP 152/88  Pulse 91  Temp(Src) 98.3 F (36.8 C) (Oral)  Resp 19  SpO2 98%  Physical Exam  Nursing note and vitals reviewed. Constitutional: He appears well-developed and well-nourished. No distress.  HENT:  Head: Normocephalic and atraumatic.  Mouth/Throat: Oropharynx is clear and moist. No oropharyngeal exudate.  Eyes: Conjunctivae and EOM are normal. Pupils are equal, round, and reactive to light. Right eye exhibits no discharge. Left eye exhibits no discharge. No scleral icterus.  Neck: Normal range of motion. Neck supple. No JVD present. No thyromegaly present.  Cardiovascular: Normal rate, regular rhythm, normal heart sounds and intact distal pulses.  Exam reveals no gallop and no friction rub.   No murmur heard. Pulmonary/Chest: Effort normal and breath sounds normal. No respiratory distress. He has no wheezes. He has no rales.  Abdominal: Soft. Bowel sounds are normal. He exhibits no distension and no mass. There is no tenderness.  Musculoskeletal: Normal range of motion. He exhibits no edema and no tenderness.  Lymphadenopathy:  He has no cervical adenopathy.  Neurological: He is alert. Coordination normal.  Skin: Skin is warm and dry. No rash noted. No erythema.  Psychiatric: He has a normal mood and affect. His behavior is normal.    ED Course  Procedures (including critical care time)   Labs Reviewed  I-STAT TROPONIN I  CBC  DIFFERENTIAL  BASIC METABOLIC PANEL  I-STAT TROPONIN I  I-STAT, CHEM 8  APTT  PROTIME-INR   No results found.   No diagnosis found.    MDM  Patient appears uncomfortable,  vital signs with mild hypertension of 152/88, pulse of 90 and temperature is 98.1. There are no abnormal lung sounds and his cardiac exam is without murmurs rubs or gallops. He has strong peripheral pulses. His EKG shows nonspecific T wave changes in most leads. This is a change from his EKG from 2003. We'll pursue further workup with chest x-ray, laboratories studies, aspirin and nitroglycerin.  ED ECG REPORT   Date: 05/02/2011   Rate: 79  Rhythm: normal sinus rhythm  QRS Axis: normal  Intervals: normal  ST/T Wave abnormalities: nonspecific T wave changes  Conduction Disutrbances:none  Narrative Interpretation:   Old EKG Reviewed: changes noted and Nonspecific T-wave changes including flattening in most leads compared with EKG from 01/05/2002    Medical records reviewed: Transition from 2003 showing mild coronary disease  - at this time he was admitted to the hospital with an elevated troponin  FINDINGS:  1. Left main trunk: Large caliber vessel, angiographically normal.  2. LAD: This is a large caliber vessel that provides four diagonal  branches. The LAD has mild irregularities of 20% in the proximal and mid  section.  3. Left circumflex artery: Large caliber vessel that provides two marginal  branches. The left circumflex system has mild irregularities.  4. Ramus intermedius: This is a small caliber vessel that bifurcates in its  proximal segment. It has moderate disease of 30-40% at its origin.  5. Right coronary artery: Dominant. This is a large caliber vessel that  provides a posterior descending artery and two posterior ventricular  branches in its terminal segment. The right coronary artery has mild  irregularities. The distal branch vessels have luminal disease as well.  6. LV: Normal end systolic and end diastolic dimensions. Overall left  ventricular function is well preserved. Ejection fraction of greater  than 55%. No mitral regurgitation.  6 AM, patient reexamined and  found to have mild improvement with nitroglycerin but still rated the pain at 5/10, GI cocktail given with good improvement of his chest pain but persistent left arm pain.  Atypical, but with hx will get Cardiology consultation  I have discussed the patient's care with Dr. Johney Frame of [facility] cardiology. Patient will be seen by cardiology shortly  Laboratory evaluation at this time shows negative troponin, and normal metabolic panel and renal function, normal white blood cell count, hemoglobin and hematocrit.  Chest x-ray as reviewed by myself shows no acute processes. Radiologist in agreement with read.  Vida Roller, MD 05/03/11 [phone]

## 2011-05-03 ENCOUNTER — Other Ambulatory Visit: Payer: Self-pay

## 2011-05-03 DIAGNOSIS — K219 Gastro-esophageal reflux disease without esophagitis: Secondary | ICD-10-CM

## 2011-05-03 LAB — LIPID PANEL
HDL: 30 mg/dL — ABNORMAL LOW (ref >39)
LDL Cholesterol: 82 mg/dL (ref 0–99)
Triglycerides: 176 mg/dL — ABNORMAL HIGH (ref <150)

## 2011-05-03 MED ORDER — NITROGLYCERIN 0.4 MG SL SUBL: 0.4000 mg | SUBLINGUAL_TABLET | SUBLINGUAL | Status: DC | PRN

## 2011-05-03 NOTE — Progress Notes (Signed)
Patient Name: David Vincent Date of Encounter: 05/03/2011     Principal Problem:  *Unstable angina Active Problems:  HYPERTENSION  SLEEP APNEA  CAD (coronary artery disease)  Chest pain    SUBJECTIVE: Pt is in good spirits. Resting comfortably. No complaints today. Chest pain resolved.    OBJECTIVE  Filed Vitals:   05/02/11 1256 05/02/11 1300 05/02/11 2100 05/03/11 0500  BP: 126/81 117/78 104/67 126/82  Pulse:  72 74 64  Temp:  97.4 F (36.3 C) 97.5 F (36.4 C) 97.4 F (36.3 C)  TempSrc:  Oral Oral Oral  Resp:  18 20 20   Height:      Weight:      SpO2:  98% 99% 99%    Intake/Output Summary (Last 24 hours) at 05/03/11 0930 Last data filed at 05/03/11 0813  Gross per 24 hour  Intake   1443 ml  Output   2175 ml  Net   -732 ml   Weight change:   PHYSICAL EXAM  General: Well developed, well nourished, NAD Head: Normocephalic, atraumatic, sclera non-icteric, no xanthomas Neck: Supple without bruits or JVD. Lungs:  Resp regular and unlabored, CTAB without wheezes, rales or rhonchi Heart: RRR no s3, s4, or murmurs. Abdomen: Soft, non-tender, non-distended, BS + x 4.  Msk:  Strength and tone appears normal for age. Extremities: No clubbing, cyanosis or edema. DP/PT/Radials 2+ and equal bilaterally. Neuro: Alert and oriented X 3. Moves all extremities spontaneously. Psych: Normal affect.  LABS:  Recent Labs  [facility] 05/02/11 0536 05/02/11 0519   WBC -- 8.5   HGB 14.6 14.5   HCT 43.0 41.0   MCV -- 90.3   PLT -- 254    Lab 05/02/11 1211 05/02/11 0536 05/02/11 0519  NA -- 143 138  K -- 3.9 3.8  CL -- 105 102  CO2 -- -- 27  BUN -- 21 19  CREATININE -- 1.00 0.96  CALCIUM -- -- 9.4  PROT 6.9 -- --  BILITOT 0.4 -- --  ALKPHOS 84 -- --  ALT 18 -- --  AST 21 -- --  AMYLASE 44 -- --  LIPASE 27 -- --  GLUCOSE -- 99 98    Recent Labs  Basename 05/02/11 1828 05/02/11 1211   CKTOTAL 67 72   CKMB 1.8 1.9   CKMBINDEX -- --   TROPONINI <0.30 <0.30     Recent Labs  Basename 05/03/11 0605   CHOL 147   HDL 30*   LDLCALC 82   TRIG 176*   CHOLHDL 4.9   LDLDIRECT --    TELE: NSR, 60-90 bpm ECG: NSR, 57 bpm, TWI VI, III; improved from prior tracing 01/23   Radiology/Studies:  Dg Chest Port 1 View  05/02/2011  *RADIOLOGY REPORT*  Clinical Data: Left-sided chest pain; left shoulder and arm pain.  PORTABLE CHEST - 1 VIEW  Comparison: None.  Findings: The lungs are well-aerated and clear.  There is no evidence of focal opacification, pleural effusion or pneumothorax.  The cardiomediastinal silhouette is borderline normal in size.  No acute osseous abnormalities are seen.  IMPRESSION: No acute cardiopulmonary process seen.  Original Report Authenticated By: Tonia Ghent, M.D.    Current Medications:     . aspirin EC  81 mg Oral Daily  . olmesartan  20 mg Oral Daily  . pantoprazole  40 mg Oral Q1200  . sodium chloride  3 mL Intravenous Q12H  . DISCONTD: omeprazole  20 mg Oral Daily    ASSESSMENT AND  PLAN:  1. Chest pain- likely GI in nature. Pt states it has resolved this morning. It was relieved with GI cocktail more so than NTG. He has had issues with this in the past. CEs negative x 3. EKG without ischemic changes. Nonobstructive CAD on cardiac cath in 2003. Will discuss with Dr. Johney Frame regarding disposition- he is stable for discharge. Recommended follow-up with outpatient stress Myoview for updated cardiac work-up as he does have CAD and some cardiac risk factors in obesity and sedentary lifestyle.   2. GERD- likely etiology of chest pain.   - Continue PPI, management per PCP  3. HTN- stable  - Continue Benicar  4. Hyperlipidemia- LDL improved at 88 today, from 108 in 04/12. HDL and TG could benefit from better control.   - Will hold off on statin for now, encouraged diet and exercise   - Management per PCP    Signed, R. Hurman Horn, PA-C 05/03/2011, 9:30 AM   I have seen, examined the patient, and reviewed the  above assessment and plan.   Doing well today without any further chest pain.  Normal exam and vitals stable.  EKg without ischemic changes today.  Will discharge to home and plan outpatient myoview.  If myoview is normal, he will follow-up with PCP.  Co Sign: Hillis Range, MD 05/03/2011 11:38 AM

## 2011-05-03 NOTE — Discharge Summary (Signed)
Discharge Summary   Patient ID: David Vincent,  MRN: [id number], DOB/AGE: 61-Feb-1952 61 y.o.  Admit date: 05/02/2011 Discharge date: 05/03/2011  Discharge Diagnoses Principal Problem:  *Chest pain Active Problems:  HYPERTENSION  SLEEP APNEA  CAD (coronary artery disease)  GERD (gastroesophageal reflux disease)   Allergies Allergies  Allergen Reactions  . Codeine Phosphate     REACTION: unspecified  . Penicillins     REACTION: unspecified    Procedures  None  History of Present Illness  David Vincent is a 61yo Caucasian male with PMHx significant for HTN, GERD and CAD (nonobstructive per cardiac cath in 2003) who was admitted to [facility] for formal chest pain rule out.   Patient was in his usual state of health when he awoke at approximately 3 AM with 8/10 substernal chest pressure and heaviness with radiation to the left arm. He denies associated nausea, vomiting, presyncope, or lightheadedness. He got up and took Maalox without relief and then drank a Coca-Cola hoping that would help him to burp but this provided no relief either. At that point he presented to the [facility] ED where he was treated with 3 sublingual nitroglycerin glycerin tablets without relief of symptoms. He was subsequently given GI cocktail and chest pain abated at approximately 5 AM (total of 2 hours duration). Here, his ECG shows nonspecific T. flattening in the anterior leads but otherwise no acute ST or T changes. His troponins remained normal in the ED.   Hospital Course   He was subsequently admitted for chest pain rule out. His CEs remained negative, EKG the following day revealed no acute evidence of ischemia, and telemetry revealed NSR. His symptoms improved and he denied chest pain the following morning. A lipid profile was ordered revealing adequate LDL.   He effectively ruled out for a cardiac source of chest pain. His chest pain was found to be likely GI in nature as it was relieved by the  GI cocktail moreso than NTG. Today he is stable, at baseline and will be discharged home. Given his history of CAD, he will have an outpatient Myoview next week. If normal, he will follow-up with his PCP. He will continue taking all outpatient meds. NTG SL PRN has been prescribed for him.   Discharge Vitals:  Blood pressure 126/82, pulse 64, temperature 97.4 F (36.3 C), temperature source Oral, resp. rate 20, height 5\' 8"  (1.727 m), weight 112.991 kg (249 lb 1.6 oz), SpO2 99.00%.   Labs: Recent Labs  Basename 05/02/11 0536 05/02/11 0519   WBC -- 8.5   HGB 14.6 14.5   HCT 43.0 41.0   MCV -- 90.3   PLT -- 254    Lab 05/02/11 1211 05/02/11 0536 05/02/11 0519  NA -- 143 138  K -- 3.9 3.8  CL -- 105 102  CO2 -- -- 27  BUN -- 21 19  CREATININE -- 1.00 0.96  CALCIUM -- -- 9.4  PROT 6.9 -- --  BILITOT 0.4 -- --  ALKPHOS 84 -- --  ALT 18 -- --  AST 21 -- --  AMYLASE 44 -- --  LIPASE 27 -- --  GLUCOSE -- 99 98    Recent Labs  Basename 05/02/11 1828 05/02/11 1211   CKTOTAL 67 72   CKMB 1.8 1.9   CKMBINDEX -- --   TROPONINI <0.30 <0.30   No components found with this basename: POCBNP Recent Labs  [facility] 05/03/11 0605   CHOL 147   HDL 30*  LDLCALC 82   TRIG 176*   CHOLHDL 4.9   LDLDIRECT --   Disposition:  Discharge Orders    Future Appointments: Provider: Department: Dept Phone: Center:   05/08/2011 11:45 AM [facility]  None     Follow-up Information    Follow up with [facility] on 05/08/2011. (11:45 AM for stress test. )    Contact information:   [address] [place] [address]       Call Rogelia Boga, MD. (As needed)    Contact information:   [address] [place] [address] [phone]          Discharge Medications:  Medication List  As of 05/03/2011  1:22 PM   START taking these medications         nitroGLYCERIN 0.4 MG SL tablet   Commonly known as:  NITROSTAT   Place 1 tablet (0.4 mg total) under the tongue every 5 (five) minutes as needed for chest pain.         CONTINUE taking these medications         olmesartan 20 MG tablet   Commonly known as: BENICAR   Take 1 tablet (20 mg total) by mouth daily.      omeprazole 20 MG tablet   Commonly known as: PRILOSEC OTC   Take 1 tablet (20 mg total) by mouth daily.         STOP taking these medications         meclizine 25 MG tablet          Where to get your medications    These are the prescriptions that you need to pick up. We sent them to a specific pharmacy, so you will need to go there to get them.   [facility] - [place], [place] - [address]    [address] [place] [address]    Phone: [phone]        nitroGLYCERIN 0.4 MG SL tablet           Outstanding Labs/Studies: None  Duration of Discharge Encounter: 40 minutes including physician time.  Signed, R. Hurman Horn, PA-C 05/03/2011, 1:22 PM   I have seen, examined the patient, and reviewed the above assessment and plan.   Co Sign: Hillis Range, MD 05/03/2011 9:22 PM

## 2011-05-08 ENCOUNTER — Ambulatory Visit (HOSPITAL_COMMUNITY): Payer: 59 | Admitting: Radiology

## 2011-05-10 ENCOUNTER — Other Ambulatory Visit (HOSPITAL_COMMUNITY): Payer: Self-pay | Admitting: Internal Medicine

## 2011-05-10 DIAGNOSIS — R079 Chest pain, unspecified: Secondary | ICD-10-CM

## 2011-05-14 ENCOUNTER — Ambulatory Visit (HOSPITAL_COMMUNITY): Payer: 59 | Attending: Cardiology | Admitting: Radiology

## 2011-05-14 VITALS — BP 138/89 | Ht 68.0 in | Wt 255.0 lb

## 2011-05-14 DIAGNOSIS — R9431 Abnormal electrocardiogram [ECG] [EKG]: Secondary | ICD-10-CM | POA: Insufficient documentation

## 2011-05-14 DIAGNOSIS — R002 Palpitations: Secondary | ICD-10-CM | POA: Insufficient documentation

## 2011-05-14 DIAGNOSIS — M25519 Pain in unspecified shoulder: Secondary | ICD-10-CM | POA: Insufficient documentation

## 2011-05-14 DIAGNOSIS — I10 Essential (primary) hypertension: Secondary | ICD-10-CM | POA: Insufficient documentation

## 2011-05-14 DIAGNOSIS — I2510 Atherosclerotic heart disease of native coronary artery without angina pectoris: Secondary | ICD-10-CM

## 2011-05-14 DIAGNOSIS — E785 Hyperlipidemia, unspecified: Secondary | ICD-10-CM | POA: Insufficient documentation

## 2011-05-14 DIAGNOSIS — M79609 Pain in unspecified limb: Secondary | ICD-10-CM | POA: Insufficient documentation

## 2011-05-14 DIAGNOSIS — E669 Obesity, unspecified: Secondary | ICD-10-CM | POA: Insufficient documentation

## 2011-05-14 DIAGNOSIS — R0789 Other chest pain: Secondary | ICD-10-CM | POA: Insufficient documentation

## 2011-05-14 DIAGNOSIS — R079 Chest pain, unspecified: Secondary | ICD-10-CM

## 2011-05-14 DIAGNOSIS — R000 Tachycardia, unspecified: Secondary | ICD-10-CM | POA: Insufficient documentation

## 2011-05-14 DIAGNOSIS — R5381 Other malaise: Secondary | ICD-10-CM | POA: Insufficient documentation

## 2011-05-14 MED ORDER — TECHNETIUM TC 99M TETROFOSMIN IV KIT
30.0000 | PACK | Freq: Once | INTRAVENOUS | Status: AC | PRN
  Administered 2011-05-14: 30 via INTRAVENOUS

## 2011-05-14 MED ORDER — TECHNETIUM TC 99M TETROFOSMIN IV KIT
10.0000 | PACK | Freq: Once | INTRAVENOUS | Status: AC | PRN
  Administered 2011-05-14: 10 via INTRAVENOUS

## 2011-05-14 NOTE — Progress Notes (Signed)
[facility] [address] NUCLEAR MED [address] [place] [address] [phone]  Cardiology Nuclear Med Study  David Vincent is a 61 y.o. male [id number] 09/09/1950   Nuclear Med Background Indication for Stress Test:  Evaluation for Ischemia, 05/03/11 Post Hospital:Chest pain, (-) enzymes, and Abnormal EKG History:  >10 yrs ago GXT:OK per patient; '03 Cath:N/O CAD, EF=55% Cardiac Risk Factors: Hypertension, Lipids and Obesity  Symptoms:  Chest Pain/Pressure>(L) Shoulder/Arm Pain.  (last episode of chest discomfort:none since discharge), Fatigue, Palpitations and Rapid HR   Nuclear Pre-Procedure Caffeine/Decaff Intake:  None NPO After: 6:30am   Lungs:  Clear. IV 0.9% NS with Angio Cath:  20g  IV Site: R Antecubital  IV Started by:  Stanton Kidney, EMT-P  Chest Size (in):  44 Cup Size: n/a  Height: 5\' 8"  (1.727 m)  Weight:  255 lb (115.667 kg)  BMI:  Body mass index is 38.77 kg/(m^2). Tech Comments:  NA    Nuclear Med Study 1 or 2 day study: 1 day  Stress Test Type:  Stress  Reading MD: Olga Millers, MD  Order Authorizing Provider:  Hillis Range, MD  Resting Radionuclide: Technetium 38m Tetrofosmin  Resting Radionuclide Dose: 11.0 mCi   Stress Radionuclide:  Technetium 24m Tetrofosmin  Stress Radionuclide Dose: 33.0 mCi           Stress Protocol Rest HR: 63 Stress HR: 159  Rest BP: Sitting 138/89  Standing 145/94 Stress BP: 242/92  Exercise Time (min): 5:00 METS: 7.0   Predicted Max HR: 160 bpm % Max HR: 99.38 bpm Rate Pressure Product: 21308   Dose of Adenosine (mg):  n/a Dose of Lexiscan: n/a mg  Dose of Atropine (mg): n/a Dose of Dobutamine: n/a mcg/kg/min (at max HR)  Stress Test Technologist: Smiley Houseman, CMA-N  Nuclear Technologist:  Domenic Polite, CNMT     Rest Procedure:  Myocardial perfusion imaging was performed at rest 45 minutes following the intravenous administration of Technetium 23m Tetrofosmin.  Rest ECG: No acute  changes.  Stress Procedure:  The patient exercised for five minutes on the treadmill utilizing the Bruce protocol.  The patient stopped due to fatigue and a hypertensive response, 242/92.  He did c/o chest pressure, 6/10, with exercise.  There were nonspecific ST-T wave changes with occasional PVC's/couplets and short runs of NSVT.  Technetium 17m Tetrofosmin was injected at peak exercise and myocardial perfusion imaging was performed after a brief delay.  Stress ECG: No significant ST segment change suggestive of ischemia.  QPS Raw Data Images:  Acquisition technically good; normal left ventricular size. Stress Images:  Normal homogeneous uptake in all areas of the myocardium. Rest Images:  Normal homogeneous uptake in all areas of the myocardium. Subtraction (SDS):  No evidence of ischemia. Transient Ischemic Dilatation (Normal <1.22):  1.03 Lung/Heart Ratio (Normal <0.45):  0.34  Quantitative Gated Spect Images QGS EDV:  97 ml QGS ESV:  28 ml QGS cine images:  NL LV Function; NL Wall Motion QGS EF: 71%  Impression Exercise Capacity:  Fair exercise capacity. BP Response:  Hypertensive blood pressure response. Clinical Symptoms:  There is chest pain. ECG Impression:  No significant ST segment change suggestive of ischemia; frequent PVCs and occasional couplet/3 beat runs of NSVT. Comparison with Prior Nuclear Study: No previous nuclear study performed  Overall Impression:  Normal stress nuclear study.   Olga Millers

## 2011-07-24 ENCOUNTER — Encounter: Payer: Self-pay | Admitting: Internal Medicine

## 2011-07-24 ENCOUNTER — Ambulatory Visit (INDEPENDENT_AMBULATORY_CARE_PROVIDER_SITE_OTHER): Payer: 59 | Admitting: Internal Medicine

## 2011-07-24 VITALS — BP 120/70 | Temp 98.1°F | Wt 262.0 lb

## 2011-07-24 DIAGNOSIS — I10 Essential (primary) hypertension: Secondary | ICD-10-CM

## 2011-07-24 DIAGNOSIS — M79609 Pain in unspecified limb: Secondary | ICD-10-CM

## 2011-07-24 DIAGNOSIS — M79606 Pain in leg, unspecified: Secondary | ICD-10-CM

## 2011-07-24 DIAGNOSIS — M25519 Pain in unspecified shoulder: Secondary | ICD-10-CM

## 2011-07-24 NOTE — Progress Notes (Signed)
  Subjective:    Patient ID: David Vincent, male    DOB: Nov 28, 1950, 61 y.o.   MRN: [id number]  HPI  61 year old patient has a history of treated hypertension. 10 days ago he fell off a 5 foot ladder and presently complains of right shoulder pain. This seems to be slowly improving. His also had some discomfort and swelling involving his right distal leg ankle and foot. The swelling resolves in the morning but becomes more problematic throughout the day but also some left lower extremity swelling as well. No history of DVT. No pulmonary complaints.    Review of Systems  Constitutional: Negative for fever, chills, appetite change and fatigue.  HENT: Negative for hearing loss, ear pain, congestion, sore throat, trouble swallowing, neck stiffness, dental problem, voice change and tinnitus.   Eyes: Negative for pain, discharge and visual disturbance.  Respiratory: Negative for cough, chest tightness, wheezing and stridor.   Cardiovascular: Positive for leg swelling. Negative for chest pain and palpitations.  Gastrointestinal: Negative for nausea, vomiting, abdominal pain, diarrhea, constipation, blood in stool and abdominal distention.  Genitourinary: Negative for urgency, hematuria, flank pain, discharge, difficulty urinating and genital sores.  Musculoskeletal: Positive for back pain and arthralgias. Negative for myalgias, joint swelling and gait problem.  Skin: Negative for rash.  Neurological: Negative for dizziness, syncope, speech difficulty, weakness, numbness and headaches.  Hematological: Negative for adenopathy. Does not bruise/bleed easily.  Psychiatric/Behavioral: Negative for behavioral problems and dysphoric mood. The patient is not nervous/anxious.        Objective:   Physical Exam  Constitutional: He appears well-developed and well-nourished. No distress.       Blood pressure normal  Musculoskeletal:       Range of motion of the right shoulder intact Slight soft tissue  swelling involving the right lower leg ankle and foot. Also some slight edema in his changes involving the left foot. No calf tenderness           Assessment & Plan:   Traumatic right shoulder and right leg pain. We'll treat with naproxen for 2 weeks and observe Hypertension stable

## 2011-07-24 NOTE — Patient Instructions (Signed)
VIMOVO 1 twice daily  You  may move around, but avoid painful motions and activities.    Call or return to clinic prn if these symptoms worsen or fail to improve as anticipated.

## 2011-09-20 ENCOUNTER — Other Ambulatory Visit: Payer: Self-pay | Admitting: Internal Medicine

## 2011-09-20 MED ORDER — EPINEPHRINE 0.15 MG/0.3ML IJ DEVI: 0.1500 mg | INTRAMUSCULAR | Status: DC | PRN

## 2011-09-20 NOTE — Telephone Encounter (Signed)
done

## 2011-09-20 NOTE — Telephone Encounter (Signed)
Patient called stating that he need a refill of his epi pen called into [facility] [employer]. Please assist.

## 2011-10-12 ENCOUNTER — Ambulatory Visit (INDEPENDENT_AMBULATORY_CARE_PROVIDER_SITE_OTHER): Payer: 59 | Admitting: Internal Medicine

## 2011-10-12 ENCOUNTER — Encounter: Payer: Self-pay | Admitting: Internal Medicine

## 2011-10-12 VITALS — BP 106/78 | Temp 98.0°F | Wt 245.0 lb

## 2011-10-12 DIAGNOSIS — I2510 Atherosclerotic heart disease of native coronary artery without angina pectoris: Secondary | ICD-10-CM

## 2011-10-12 DIAGNOSIS — I10 Essential (primary) hypertension: Secondary | ICD-10-CM

## 2011-10-12 DIAGNOSIS — R079 Chest pain, unspecified: Secondary | ICD-10-CM

## 2011-10-12 NOTE — Patient Instructions (Addendum)
Limit your sodium (Salt) intake  Please check your blood pressure on a regular basis.  If it is consistently greater than 150/90, please make an office appointment.   

## 2011-10-12 NOTE — Progress Notes (Signed)
  Subjective:    Patient ID: David Vincent, male    DOB: [birth year], 61 y.o.   MRN: [id number]  HPI  61 year old patient who presents today with a chief complaint of chest pain. This occurred following heavy exertion when he was working on his car and attempted to move a heavy transmission. Pain is aggravated by movement especially cough is worse in the morning when he attempts to get out of bed. Pain is in the anterior chest area. He does have a history of nonobstructive coronary artery disease and a history of a negative nuclear medicine stress test in January of this year.   Wt Readings from Last 3 Encounters:  10/12/11 245 lb (111.131 kg)  07/24/11 262 lb (118.842 kg)  05/14/11 255 lb (115.667 kg)    Review of Systems  Constitutional: Negative for fever, chills, appetite change and fatigue.  HENT: Negative for hearing loss, ear pain, congestion, sore throat, trouble swallowing, neck stiffness, dental problem, voice change and tinnitus.   Eyes: Negative for pain, discharge and visual disturbance.  Respiratory: Negative for cough, chest tightness, wheezing and stridor.   Cardiovascular: Positive for chest pain. Negative for palpitations and leg swelling.  Gastrointestinal: Negative for nausea, vomiting, abdominal pain, diarrhea, constipation, blood in stool and abdominal distention.  Genitourinary: Negative for urgency, hematuria, flank pain, discharge, difficulty urinating and genital sores.  Musculoskeletal: Negative for myalgias, back pain, joint swelling, arthralgias and gait problem.  Skin: Negative for rash.  Neurological: Negative for dizziness, syncope, speech difficulty, weakness, numbness and headaches.  Hematological: Negative for adenopathy. Does not bruise/bleed easily.  Psychiatric/Behavioral: Negative for behavioral problems and dysphoric mood. The patient is not nervous/anxious.        Objective:   Physical Exam  Constitutional: He is oriented to person, place, and  time. He appears well-developed.  HENT:  Head: Normocephalic.  Right Ear: External ear normal.  Left Ear: External ear normal.  Eyes: Conjunctivae and EOM are normal.  Neck: Normal range of motion.  Cardiovascular: Normal rate and normal heart sounds.   Pulmonary/Chest: Breath sounds normal. He exhibits tenderness.       Mild anterior chest wall tenderness to palpation it did reproduce his discomfort  Abdominal: Bowel sounds are normal.  Musculoskeletal: Normal range of motion. He exhibits no edema and no tenderness.  Neurological: He is alert and oriented to person, place, and time.  Psychiatric: He has a normal mood and affect. His behavior is normal.          Assessment & Plan:  Chest wall pain Hypertension stable  Patient reassured. He'll be treated with Aleve 2 tablets twice daily  CPX 6 months

## 2011-12-06 ENCOUNTER — Other Ambulatory Visit: Payer: 59

## 2011-12-06 ENCOUNTER — Other Ambulatory Visit (INDEPENDENT_AMBULATORY_CARE_PROVIDER_SITE_OTHER): Payer: 59

## 2011-12-06 DIAGNOSIS — Z0000 Encounter for general adult medical examination without abnormal findings: Secondary | ICD-10-CM

## 2011-12-06 LAB — POCT URINALYSIS DIPSTICK
Nitrite, UA: NEGATIVE
Spec Grav, UA: 1.02
Urobilinogen, UA: 0.2
pH, UA: 7

## 2011-12-06 LAB — CBC WITH DIFFERENTIAL/PLATELET
Basophils Absolute: 0 10*3/uL (ref 0.0–0.1)
Eosinophils Absolute: 0.1 10*3/uL (ref 0.0–0.7)
Lymphocytes Relative: 33.6 % (ref 12.0–46.0)
MCHC: 32.9 g/dL (ref 30.0–36.0)
Neutrophils Relative %: 57.9 % (ref 43.0–77.0)
Platelets: 248 10*3/uL (ref 150.0–400.0)
RBC: 4.43 Mil/uL (ref 4.22–5.81)
RDW: 13.7 % (ref 11.5–14.6)

## 2011-12-06 LAB — HEPATIC FUNCTION PANEL
Alkaline Phosphatase: 73 U/L (ref 39–117)
Bilirubin, Direct: 0.1 mg/dL (ref 0.0–0.3)
Total Bilirubin: 0.7 mg/dL (ref 0.3–1.2)

## 2011-12-06 LAB — LIPID PANEL
HDL: 29.6 mg/dL — ABNORMAL LOW (ref >39.00)
LDL Cholesterol: 81 mg/dL (ref 0–99)
Total CHOL/HDL Ratio: 4
Triglycerides: 108 mg/dL (ref 0.0–149.0)

## 2011-12-06 LAB — BASIC METABOLIC PANEL
BUN: 14 mg/dL (ref 6–23)
CO2: 27 mEq/L (ref 19–32)
Calcium: 9.3 mg/dL (ref 8.4–10.5)
Creatinine, Ser: 1 mg/dL (ref 0.4–1.5)
Glucose, Bld: 89 mg/dL (ref 70–99)

## 2011-12-13 ENCOUNTER — Encounter: Payer: 59 | Admitting: Internal Medicine

## 2011-12-13 ENCOUNTER — Encounter: Payer: Self-pay | Admitting: Internal Medicine

## 2011-12-14 ENCOUNTER — Ambulatory Visit (INDEPENDENT_AMBULATORY_CARE_PROVIDER_SITE_OTHER): Payer: 59 | Admitting: Internal Medicine

## 2011-12-14 ENCOUNTER — Encounter: Payer: Self-pay | Admitting: Internal Medicine

## 2011-12-14 VITALS — BP 110/70 | HR 72 | Temp 98.0°F | Resp 18 | Ht 70.0 in | Wt 242.0 lb

## 2011-12-14 DIAGNOSIS — I10 Essential (primary) hypertension: Secondary | ICD-10-CM

## 2011-12-14 DIAGNOSIS — H8110 Benign paroxysmal vertigo, unspecified ear: Secondary | ICD-10-CM

## 2011-12-14 DIAGNOSIS — Z23 Encounter for immunization: Secondary | ICD-10-CM

## 2011-12-14 MED ORDER — MECLIZINE HCL 25 MG PO TABS: 25.0000 mg | ORAL_TABLET | Freq: Three times a day (TID) | ORAL | Status: DC | PRN

## 2011-12-14 MED ORDER — OLMESARTAN MEDOXOMIL 20 MG PO TABS: 20.0000 mg | ORAL_TABLET | Freq: Every day | ORAL | Status: DC

## 2011-12-14 NOTE — Patient Instructions (Signed)

## 2011-12-14 NOTE — Progress Notes (Signed)
Subjective:    Patient ID: David Vincent, male    DOB: 1950/10/06, 61 y.o.   MRN: [id number]  HPI Wt Readings from Last 3 Encounters:  12/14/11 242 lb (109.77 kg)  10/12/11 245 lb (111.131 kg)  07/24/11 262 lb (118.842 kg)    History of Present Illness:  61 year-old male seen today for an annual physical.  He has a history of nonobstructive CAD and had a heart catheterization performed in 2003. In January of this year he underwent a nuclear stress test that was normal. He is doing quite well today. He has treated hypertension which has been well on Benicar 20 mg daily  Current Allergies:  CODEINE PHOSPHATE (CODEINE PHOSPHATE)  PENICILLIN G POTASSIUM (PENICILLIN G POTASSIUM)   Past Medical History:  ED  GERD  Hypertension  history of mumps orchitis   Past Surgical History:  R knee surgery arthroscopic  2003, negative heart catheterization  Family History:  both parents, history of hypertension. Two brothers two sisters, one sister with hypertension one  Brother  deceased from leukemia at age 26  father died at age 47, hypertension, renal failure  mother history of late onset diabetes   Past Medical History  Diagnosis Date  . BACK PAIN 03/10/2007  . HYPERTENSION 11/15/2006  . INGUINAL HERNIA, RIGHT 12/05/2009  . LEG CRAMPS 12/03/2008  . SLEEP APNEA 11/16/2008  . VERTIGO, POSITIONAL 08/08/2009  . ED (erectile dysfunction)   . History of mumps orchitis   . GERD (gastroesophageal reflux disease)   . CAD (coronary artery disease)     A.  01/05/2002 Cath - nonobs dzs (LAD 20prox, RI 30-40 prox)  . Unstable angina     History   Social History  . Marital Status: Married    Spouse Name: N/A    Number of Children: N/A  . Years of Education: N/A   Occupational History  . Not on file.   Social History Main Topics  . Smoking status: Never Smoker   . Smokeless tobacco: Never Used  . Alcohol Use: No  . Drug Use: No  . Sexually Active: Not on file   Other Topics Concern  .  Not on file   Social History Narrative   05/02/2011:  Currently unemployed.  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  Does not exercise or adhere to any specific diet.    Past Surgical History  Procedure Date  . Cardiac catheterization   . Knee arthroscopy     Right    Family History  Problem Relation Age of Onset  . Heart attack Father     died 24  . Pneumonia Mother     died 44 - hip fx/pna  . Hypertension Sister     alive  . Cancer Brother     died - agent orange exposure  . Hypertension Brother     alive    Allergies  Allergen Reactions  . Codeine Phosphate     REACTION: unspecified  . Penicillins     REACTION: unspecified    Current Outpatient Prescriptions on File Prior to Visit  Medication Sig Dispense Refill  . EPINEPHrine (EPIPEN JR) 0.15 MG/0.3ML injection Inject 0.3 mLs (0.15 mg total) into the muscle as needed.  2 each  1  . omeprazole (PRILOSEC OTC) 20 MG tablet Take 1 tablet (20 mg total) by mouth daily.  90 tablet  6  . DISCONTD: olmesartan (BENICAR) 20 MG tablet Take 1 tablet (20 mg  total) by mouth daily.  90 tablet  6    BP 110/70  Pulse 72  Temp 98 F (36.7 C) (Oral)  Resp 18  Ht 5\' 10"  (1.778 m)  Wt 242 lb (109.77 kg)  BMI 34.72 kg/m2  SpO2 97%      Review of Systems  Constitutional: Negative for fever, chills, activity change, appetite change and fatigue.  HENT: Negative for hearing loss, ear pain, congestion, rhinorrhea, sneezing, mouth sores, trouble swallowing, neck pain, neck stiffness, dental problem, voice change, sinus pressure and tinnitus.   Eyes: Negative for photophobia, pain, redness and visual disturbance.  Respiratory: Negative for apnea, cough, choking, chest tightness, shortness of breath and wheezing.   Cardiovascular: Negative for chest pain, palpitations and leg swelling.  Gastrointestinal: Negative for nausea, vomiting, abdominal pain, diarrhea, constipation, blood in stool, abdominal distention,  anal bleeding and rectal pain.  Genitourinary: Negative for dysuria, urgency, frequency, hematuria, flank pain, decreased urine volume, discharge, penile swelling, scrotal swelling, difficulty urinating, genital sores and testicular pain.  Musculoskeletal: Negative for myalgias, back pain, joint swelling, arthralgias and gait problem.  Skin: Negative for color change, rash and wound.  Neurological: Negative for dizziness, tremors, seizures, syncope, facial asymmetry, speech difficulty, weakness, light-headedness, numbness and headaches.  Hematological: Negative for adenopathy. Does not bruise/bleed easily.  Psychiatric/Behavioral: Negative for suicidal ideas, hallucinations, behavioral problems, confusion, disturbed wake/sleep cycle, self-injury, dysphoric mood, decreased concentration and agitation. The patient is not nervous/anxious.        Objective:   Physical Exam  Constitutional: He appears well-developed and well-nourished.       Overweight Blood pressure low normal  HENT:  Head: Normocephalic and atraumatic.  Right Ear: External ear normal.  Left Ear: External ear normal.  Nose: Nose normal.  Mouth/Throat: Oropharynx is clear and moist.  Eyes: Conjunctivae and EOM are normal. Pupils are equal, round, and reactive to light. No scleral icterus.  Neck: Normal range of motion. Neck supple. No JVD present. No thyromegaly present.  Cardiovascular: Regular rhythm, normal heart sounds and intact distal pulses.  Exam reveals no gallop and no friction rub.   No murmur heard. Pulmonary/Chest: Effort normal and breath sounds normal. He exhibits no tenderness.  Abdominal: Soft. Bowel sounds are normal. He exhibits no distension and no mass. There is no tenderness.  Genitourinary: Prostate normal and penis normal.  Musculoskeletal: Normal range of motion. He exhibits no edema and no tenderness.  Lymphadenopathy:    He has no cervical adenopathy.  Neurological: He is alert. He has normal  reflexes. No cranial nerve deficit. Coordination normal.  Skin: Skin is warm and dry. No rash noted.  Psychiatric: He has a normal mood and affect. His behavior is normal.          Assessment & Plan:   Preventive health examination Hypertension well controlled Gastroesophageal reflux disease stable Exogenous obesity improving  Diet weight loss exercise all encouraged Home blood pressure monitor and encouraged Recheck 6 months or as needed

## 2011-12-28 ENCOUNTER — Inpatient Hospital Stay (HOSPITAL_COMMUNITY)
Admission: EM | Admit: 2011-12-28 | Discharge: 2012-01-01 | DRG: 184 | Disposition: A | Discharge status: Home / Self Care | Payer: 59 | Attending: General Surgery | Admitting: General Surgery

## 2011-12-28 ENCOUNTER — Emergency Department (HOSPITAL_COMMUNITY): Payer: 59

## 2011-12-28 ENCOUNTER — Inpatient Hospital Stay (HOSPITAL_COMMUNITY): Payer: 59

## 2011-12-28 ENCOUNTER — Encounter (HOSPITAL_COMMUNITY): Payer: Self-pay | Admitting: Emergency Medicine

## 2011-12-28 DIAGNOSIS — S2242XA Multiple fractures of ribs, left side, initial encounter for closed fracture: Secondary | ICD-10-CM | POA: Diagnosis present

## 2011-12-28 DIAGNOSIS — S139XXA Sprain of joints and ligaments of unspecified parts of neck, initial encounter: Secondary | ICD-10-CM | POA: Diagnosis present

## 2011-12-28 DIAGNOSIS — R112 Nausea with vomiting, unspecified: Secondary | ICD-10-CM | POA: Diagnosis not present

## 2011-12-28 DIAGNOSIS — S2249XA Multiple fractures of ribs, unspecified side, initial encounter for closed fracture: Secondary | ICD-10-CM

## 2011-12-28 DIAGNOSIS — I2510 Atherosclerotic heart disease of native coronary artery without angina pectoris: Secondary | ICD-10-CM | POA: Diagnosis present

## 2011-12-28 DIAGNOSIS — Z79899 Other long term (current) drug therapy: Secondary | ICD-10-CM

## 2011-12-28 DIAGNOSIS — Y93H3 Activity, building and construction: Secondary | ICD-10-CM

## 2011-12-28 DIAGNOSIS — I10 Essential (primary) hypertension: Secondary | ICD-10-CM | POA: Diagnosis present

## 2011-12-28 DIAGNOSIS — G4733 Obstructive sleep apnea (adult) (pediatric): Secondary | ICD-10-CM | POA: Diagnosis present

## 2011-12-28 DIAGNOSIS — S060X1A Concussion with loss of consciousness of 30 minutes or less, initial encounter: Secondary | ICD-10-CM | POA: Diagnosis present

## 2011-12-28 DIAGNOSIS — I959 Hypotension, unspecified: Secondary | ICD-10-CM | POA: Diagnosis not present

## 2011-12-28 DIAGNOSIS — R42 Dizziness and giddiness: Secondary | ICD-10-CM | POA: Diagnosis present

## 2011-12-28 DIAGNOSIS — K219 Gastro-esophageal reflux disease without esophagitis: Secondary | ICD-10-CM | POA: Diagnosis present

## 2011-12-28 DIAGNOSIS — S060X9A Concussion with loss of consciousness of unspecified duration, initial encounter: Secondary | ICD-10-CM | POA: Diagnosis present

## 2011-12-28 DIAGNOSIS — W11XXXA Fall on and from ladder, initial encounter: Secondary | ICD-10-CM | POA: Diagnosis present

## 2011-12-28 DIAGNOSIS — S335XXA Sprain of ligaments of lumbar spine, initial encounter: Secondary | ICD-10-CM | POA: Diagnosis present

## 2011-12-28 LAB — CBC
HCT: 39 % (ref 39.0–52.0)
Platelets: 223 10*3/uL (ref 150–400)
RDW: 14 % (ref 11.5–15.5)
WBC: 11.8 10*3/uL — ABNORMAL HIGH (ref 4.0–10.5)

## 2011-12-28 LAB — POCT I-STAT, CHEM 8
Chloride: 106 mEq/L (ref 96–112)
HCT: 40 % (ref 39.0–52.0)
Potassium: 4 mEq/L (ref 3.5–5.1)

## 2011-12-28 MED ORDER — SODIUM CHLORIDE 0.9 % IJ SOLN
3.0000 mL | Freq: Two times a day (BID) | INTRAMUSCULAR | Status: DC
  Administered 2011-12-30 – 2011-12-31 (×3): 3 mL via INTRAVENOUS

## 2011-12-28 MED ORDER — HYDROMORPHONE HCL PF 1 MG/ML IJ SOLN
1.0000 mg | Freq: Once | INTRAMUSCULAR | Status: AC
  Administered 2011-12-28: 1 mg via INTRAVENOUS
  Filled 2011-12-28: qty 1

## 2011-12-28 MED ORDER — PANTOPRAZOLE SODIUM 40 MG PO TBEC
40.0000 mg | DELAYED_RELEASE_TABLET | Freq: Every day | ORAL | Status: DC
  Administered 2011-12-29 – 2011-12-31 (×3): 40 mg via ORAL
  Filled 2011-12-28 (×3): qty 1

## 2011-12-28 MED ORDER — DIPHENHYDRAMINE HCL 50 MG/ML IJ SOLN: 12.5000 mg | Freq: Four times a day (QID) | INTRAMUSCULAR | Status: DC | PRN

## 2011-12-28 MED ORDER — ONDANSETRON HCL 4 MG PO TABS: 4.0000 mg | ORAL_TABLET | Freq: Four times a day (QID) | ORAL | Status: DC | PRN

## 2011-12-28 MED ORDER — MORPHINE SULFATE 4 MG/ML IJ SOLN
8.0000 mg | Freq: Once | INTRAMUSCULAR | Status: AC
  Administered 2011-12-28: 8 mg via INTRAVENOUS
  Filled 2011-12-28: qty 2

## 2011-12-28 MED ORDER — MECLIZINE HCL 25 MG PO TABS
25.0000 mg | ORAL_TABLET | Freq: Three times a day (TID) | ORAL | Status: DC | PRN
  Filled 2011-12-28: qty 1

## 2011-12-28 MED ORDER — SODIUM CHLORIDE 0.9 % IJ SOLN: 9.0000 mL | INTRAMUSCULAR | Status: DC | PRN

## 2011-12-28 MED ORDER — IRBESARTAN 150 MG PO TABS
150.0000 mg | ORAL_TABLET | Freq: Every day | ORAL | Status: DC
  Administered 2011-12-30 – 2012-01-01 (×3): 150 mg via ORAL
  Filled 2011-12-28 (×4): qty 1

## 2011-12-28 MED ORDER — NALOXONE HCL 0.4 MG/ML IJ SOLN: 0.4000 mg | INTRAMUSCULAR | Status: DC | PRN

## 2011-12-28 MED ORDER — SODIUM CHLORIDE 0.9 % IV SOLN: 250.0000 mL | INTRAVENOUS | Status: DC | PRN

## 2011-12-28 MED ORDER — ONDANSETRON HCL 4 MG/2ML IJ SOLN
4.0000 mg | Freq: Four times a day (QID) | INTRAMUSCULAR | Status: DC | PRN
  Administered 2011-12-29 (×2): 4 mg via INTRAVENOUS
  Filled 2011-12-28 (×2): qty 2

## 2011-12-28 MED ORDER — ENOXAPARIN SODIUM 30 MG/0.3ML ~~LOC~~ SOLN
30.0000 mg | Freq: Two times a day (BID) | SUBCUTANEOUS | Status: DC
  Administered 2011-12-28 – 2012-01-01 (×8): 30 mg via SUBCUTANEOUS
  Filled 2011-12-28 (×10): qty 0.3

## 2011-12-28 MED ORDER — IOHEXOL 300 MG/ML  SOLN
80.0000 mL | Freq: Once | INTRAMUSCULAR | Status: AC | PRN
  Administered 2011-12-28: 80 mL via INTRAVENOUS

## 2011-12-28 MED ORDER — PANTOPRAZOLE SODIUM 40 MG IV SOLR
40.0000 mg | Freq: Every day | INTRAVENOUS | Status: DC
  Filled 2011-12-28 (×3): qty 40

## 2011-12-28 MED ORDER — MORPHINE SULFATE (PF) 1 MG/ML IV SOLN
INTRAVENOUS | Status: DC
  Administered 2011-12-28 – 2011-12-29 (×2): via INTRAVENOUS
  Administered 2011-12-29: 2.87 mg via INTRAVENOUS
  Filled 2011-12-28 (×3): qty 25

## 2011-12-28 MED ORDER — SODIUM CHLORIDE 0.9 % IJ SOLN: 3.0000 mL | INTRAMUSCULAR | Status: DC | PRN

## 2011-12-28 MED ORDER — DOCUSATE SODIUM 100 MG PO CAPS
100.0000 mg | ORAL_CAPSULE | Freq: Two times a day (BID) | ORAL | Status: DC
  Administered 2011-12-29 – 2012-01-01 (×8): 100 mg via ORAL
  Filled 2011-12-28 (×8): qty 1

## 2011-12-28 MED ORDER — DIPHENHYDRAMINE HCL 12.5 MG/5ML PO ELIX
12.5000 mg | ORAL_SOLUTION | Freq: Four times a day (QID) | ORAL | Status: DC | PRN
  Filled 2011-12-28: qty 5

## 2011-12-28 MED ORDER — POLYETHYLENE GLYCOL 3350 17 G PO PACK
17.0000 g | PACK | Freq: Every day | ORAL | Status: DC
  Administered 2011-12-28 – 2012-01-01 (×5): 17 g via ORAL
  Filled 2011-12-28 (×5): qty 1

## 2011-12-28 NOTE — ED Notes (Signed)
Pt to be transported to [facility] from [facility].

## 2011-12-28 NOTE — H&P (Signed)
David Vincent is an 61 y.o. male.   Chief Complaint: Fall HPI: David Vincent was up a ladder about 6 feet pulling a nail out of a rafter. When it came out he lost his balance and fell on hard clay. There was a loss of consciousness for several seconds. When he came to he was gasping for air and complaining of chest pain. He is amnestic to the event.  Past Medical History  Diagnosis Date  . BACK PAIN 03/10/2007  . HYPERTENSION 11/15/2006  . INGUINAL HERNIA, RIGHT 12/05/2009  . LEG CRAMPS 12/03/2008  . SLEEP APNEA 11/16/2008  . VERTIGO, POSITIONAL 08/08/2009  . ED (erectile dysfunction)   . History of mumps orchitis   . GERD (gastroesophageal reflux disease)   . CAD (coronary artery disease)     A.  01/05/2002 Cath - nonobs dzs (LAD 20prox, RI 30-40 prox)  . Unstable angina     Past Surgical History  Procedure Date  . Cardiac catheterization   . Knee arthroscopy     Right    Family History  Problem Relation Age of Onset  . Heart attack Father     died 35  . Pneumonia Mother     died 36 - hip fx/pna  . Hypertension Sister     alive  . Cancer Brother     died - agent orange exposure  . Hypertension Brother     alive   Social History:  reports that he has never smoked. He has never used smokeless tobacco. He reports that he does not drink alcohol or use illicit drugs.  Allergies:  Allergies  Allergen Reactions  . Codeine Phosphate     REACTION: unspecified  . Penicillins     REACTION: unspecified     (Not in a hospital admission)  Results for orders placed during the hospital encounter of 12/28/11 (from the past 48 hour(s))  POCT I-STAT, CHEM 8     Status: Abnormal   Collection Time   12/28/11  4:38 PM      Component Value Range Comment   Sodium 142  135 - 145 mEq/L    Potassium 4.0  3.5 - 5.1 mEq/L    Chloride 106  96 - 112 mEq/L    BUN 17  6 - 23 mg/dL    Creatinine, Ser 6.57  0.50 - 1.35 mg/dL    Glucose, Bld 846 (*) 70 - 99 mg/dL    Calcium, Ion 9.62  9.52 - 1.30  mmol/L    TCO2 24  0 - 100 mmol/L    Hemoglobin 13.6  13.0 - 17.0 g/dL    HCT 84.1  32.4 - 40.1 %    Dg Ribs Unilateral W/chest Left  12/28/2011  *RADIOLOGY REPORT*  Clinical Data: Left chest and scapular pain following a 5 foot fall.  LEFT RIBS AND CHEST - 3+ VIEW  Comparison: 05/02/2011.  Findings: Poor inspiration with mild bibasilar atelectasis. Interval displaced left sixth lateral rib fracture.  Minimally displaced left lateral seventh rib fracture.  No pneumothorax. Small amount of probable pleural fluid on the left.  Grossly normal sized heart.  IMPRESSION:  1.  Displaced left lateral sixth rib fracture and minimally displaced left lateral seventh rib fracture without pneumothorax. 2.  Poor inspiration with mild bibasilar atelectasis. 3.  Probable minimal left hemothorax.   Original Report Authenticated By: Darrol Angel, M.D.    Dg Cervical Spine Complete  12/28/2011  *RADIOLOGY REPORT*  Clinical Data: Left neck pain following a 5  foot fall.  CERVICAL SPINE - COMPLETE 4+ VIEW  Comparison: None.  Findings: Normal appearing bones and soft tissues without fracture, subluxation or prevertebral soft tissue swelling.  IMPRESSION: Normal examination.   Original Report Authenticated By: Darrol Angel, M.D.    Dg Scapula Left  12/28/2011  *RADIOLOGY REPORT*  Clinical Data: Left scapular pain following a 5 foot fall.  LEFT SCAPULA - 2+ VIEWS  Comparison: Left rib radiographs obtained at the same time.  Findings: Normal appearing scapula without fracture or dislocation. Previously described left sixth and seventh rib fractures.  IMPRESSION:  1.  No scapular fracture or dislocation. 2.  Previously described left sixth and seventh rib fractures.   Original Report Authenticated By: Darrol Angel, M.D.     Review of Systems  Constitutional: Negative for weight loss.  HENT: Negative for hearing loss, ear pain, neck pain, tinnitus and ear discharge.   Eyes: Negative for blurred vision, double vision,  photophobia and pain.  Respiratory: Negative for cough, sputum production and shortness of breath.   Cardiovascular: Positive for chest pain.  Gastrointestinal: Negative for nausea, vomiting and abdominal pain.  Genitourinary: Negative for dysuria, urgency, frequency and flank pain.  Musculoskeletal: Positive for falls. Negative for myalgias, back pain and joint pain.  Neurological: Positive for loss of consciousness. Negative for dizziness, tingling, sensory change, focal weakness and headaches.  Endo/Heme/Allergies: Does not bruise/bleed easily.  Psychiatric/Behavioral: Negative for depression, memory loss and substance abuse. The patient is not nervous/anxious.     Blood pressure 129/77, pulse 75, temperature 97.5 F (36.4 C), temperature source Oral, resp. rate 16, SpO2 98.00%. Physical Exam  Vitals reviewed. Constitutional: He is oriented to person, place, and time. He appears well-developed and well-nourished. He is cooperative. No distress. Nasal cannula in place.  HENT:  Head: Normocephalic and atraumatic. Head is without raccoon's eyes, without Battle's sign, without abrasion, without contusion and without laceration.  Right Ear: Hearing, tympanic membrane, external ear and ear canal normal. No lacerations. No drainage or tenderness. No foreign bodies. Tympanic membrane is not perforated. No hemotympanum.  Left Ear: Hearing, tympanic membrane, external ear and ear canal normal. No lacerations. No drainage or tenderness. No foreign bodies. Tympanic membrane is not perforated. No hemotympanum.  Nose: Nose normal. No nose lacerations, sinus tenderness, nasal deformity or nasal septal hematoma. No epistaxis.  Mouth/Throat: Uvula is midline, oropharynx is clear and moist and mucous membranes are normal. No lacerations.  Eyes: Conjunctivae normal, EOM and lids are normal. Pupils are equal, round, and reactive to light. No scleral icterus.  Neck: Trachea normal and normal range of motion.  Neck supple. No JVD present. Spinous process tenderness present. No muscular tenderness present. Carotid bruit is not present. No thyromegaly present.  Cardiovascular: Normal rate, regular rhythm, normal heart sounds, intact distal pulses and normal pulses.  Exam reveals no gallop and no friction rub.   No murmur heard. Respiratory: Effort normal and breath sounds normal. No respiratory distress. He has no wheezes. He has no rales. He exhibits bony tenderness. He exhibits no tenderness, no laceration and no crepitus.  GI: Soft. Normal appearance and bowel sounds are normal. He exhibits no distension. There is tenderness in the right upper quadrant and right lower quadrant. There is CVA tenderness (Left). There is no rigidity, no rebound and no guarding.  Musculoskeletal: Normal range of motion. He exhibits no edema and no tenderness.       Lumbar back: He exhibits pain (with right straight leg raise).  Lymphadenopathy:  He has no cervical adenopathy.  Neurological: He is alert and oriented to person, place, and time. He has normal strength. No cranial nerve deficit or sensory deficit. GCS eye subscore is 4. GCS verbal subscore is 5. GCS motor subscore is 6.  Skin: Skin is warm, dry and intact. He is not diaphoretic.  Psychiatric: He has a normal mood and affect. His speech is normal and behavior is normal. He exhibits abnormal recent memory.     Assessment/Plan Fall Multiple left rib fxs -- Needs pain control and pulmonary toilet. Will admit to hospital. Mildly concerned about apparent new rightward tracheal deviation on CXR, will get chest CT. Concussion -- Needs head CT Cervical strain Lumbar strain -- I doubt any fracture without midline TTP. Treat conservatively. GERD HTN Vertigo OSA   Freeman Caldron, PA-C Pager: [phone] General Trauma [facility] Pager: [phone]  12/28/2011, 4:45 PM

## 2011-12-28 NOTE — ED Notes (Signed)
Pt returned from radiology, resting in bed, states pain is improved but still present, no distress noted. C-collar remains in place.

## 2011-12-28 NOTE — ED Provider Notes (Signed)
History     CSN: [id number]  Arrival date & time 12/28/11  1319   First MD Initiated Contact with Patient 12/28/11 1412      Chief Complaint  Patient presents with  . Fall    (Consider location/radiation/quality/duration/timing/severity/associated sxs/prior treatment) HPI Complains of left-sided rib pain after he fell possibly 6 feet off the ladder falling onto his left ribs and shoulder. Son reports he suffered loss of consciousness for approximately 10 seconds which resolved spontaneously. He denies shortness of breath denies bowel pain denies other complaints treated by EMS with long board and hard collar and CID. Pain is moderate to severe, worse with movement  with deep inspiration. Improved by remaining still no other associated injuries. No other complaint Past Medical History  Diagnosis Date  . BACK PAIN 03/10/2007  . HYPERTENSION 11/15/2006  . INGUINAL HERNIA, RIGHT 12/05/2009  . LEG CRAMPS 12/03/2008  . SLEEP APNEA 11/16/2008  . VERTIGO, POSITIONAL 08/08/2009  . ED (erectile dysfunction)   . History of mumps orchitis   . GERD (gastroesophageal reflux disease)   . CAD (coronary artery disease)     A.  01/05/2002 Cath - nonobs dzs (LAD 20prox, RI 30-40 prox)  . Unstable angina     Past Surgical History  Procedure Date  . Cardiac catheterization   . Knee arthroscopy     Right    Family History  Problem Relation Age of Onset  . Heart attack Father     died 46  . Pneumonia Mother     died 73 - hip fx/pna  . Hypertension Sister     alive  . Cancer Brother     died - agent orange exposure  . Hypertension Brother     alive    History  Substance Use Topics  . Smoking status: Never Smoker   . Smokeless tobacco: Never Used  . Alcohol Use: No      Review of Systems  Allergies  Codeine phosphate and Penicillins  Home Medications   Current Outpatient Rx  Name Route Sig Dispense Refill  . EPINEPHRINE 0.15 MG/0.3ML IJ DEVI Intramuscular Inject 0.3 mLs (0.15 mg  total) into the muscle as needed. 2 each 1  . MECLIZINE HCL 25 MG PO TABS Oral Take 1 tablet (25 mg total) by mouth 3 (three) times daily as needed. 30 tablet 6  . NAPROXEN SODIUM 220 MG PO TABS Oral Take 220 mg by mouth daily.    Marland Kitchen OLMESARTAN MEDOXOMIL 20 MG PO TABS Oral Take 1 tablet (20 mg total) by mouth daily. 90 tablet 6  . OMEPRAZOLE MAGNESIUM 20 MG PO TBEC Oral Take 1 tablet (20 mg total) by mouth daily. 90 tablet 6    BP 129/77  Pulse 77  Temp 97.5 F (36.4 C) (Oral)  Resp 18  SpO2 98%  Physical Exam  Nursing note and vitals reviewed. Constitutional: He appears well-developed and well-nourished.  HENT:  Head: Normocephalic and atraumatic.  Right Ear: External ear normal.  Left Ear: External ear normal.  Mouth/Throat: Oropharynx is clear and moist.  Eyes: Conjunctivae normal are normal. Pupils are equal, round, and reactive to light.  Neck: Neck supple. No tracheal deviation present. No thyromegaly present.       Cervical spine tender  Cardiovascular: Normal rate and regular rhythm.   No murmur heard. Pulmonary/Chest: Effort normal and breath sounds normal.       Left upper extremity tender along the scapula, no deformity no swelling full range of motion neurovascular intact  all other extremities atraumatic, nontender neurovascularly intact  Abdominal: Soft. Bowel sounds are normal. He exhibits no distension. There is no tenderness.  Musculoskeletal: Normal range of motion. He exhibits no edema and no tenderness.       Cervical spine is tender, thoracic spine nontender lumbar spine nontender pelvis is stable  Neurological: He is alert. No cranial nerve deficit. Coordination normal.       Motor strength 5 over 5 overall  Skin: Skin is warm and dry. No rash noted.  Psychiatric: He has a normal mood and affect.    ED Course  Procedures (including critical care time)  Labs Reviewed - No data to display No results found.   No diagnosis found. X-rays reviewed by  me 415 p.m. is not well controlled after treatment with intravenous morphine. Dilaudid ordered  MDM  Spoke with Dr. Andrey Campanile, surgeon who will evaluate patient. Patient will likely require admission for pain control Dx #1 fall #2 multiple rib fractures.        Doug Sou, MD 12/28/11 1625

## 2011-12-28 NOTE — ED Notes (Signed)
Per EMS: Pt from home for evaluation of injuries from a 6 foot fall from a ladder. Fall witnessed by pt's son and pt's brother. Pt +LOC for roughly 10 seconds post fall. Fell on L side. Pain to L shoulder, rib cage, hip and increases with palpation. No apparent bruising at this time. Hx L shoulder injury 3-4 mos ago from fall.

## 2011-12-28 NOTE — ED Notes (Signed)
Pt remains in radiology at this time.

## 2011-12-28 NOTE — ED Notes (Signed)
Pt to CT at this time.

## 2011-12-29 ENCOUNTER — Inpatient Hospital Stay (HOSPITAL_COMMUNITY): Payer: 59

## 2011-12-29 MED ORDER — BACITRACIN-NEOMYCIN-POLYMYXIN 400-5-5000 EX OINT
TOPICAL_OINTMENT | CUTANEOUS | Status: AC
  Filled 2011-12-29: qty 1

## 2011-12-29 MED ORDER — POLYETHYLENE GLYCOL 3350 17 G PO PACK
17.0000 g | PACK | Freq: Once | ORAL | Status: DC
  Filled 2011-12-29: qty 1

## 2011-12-29 MED ORDER — SODIUM CHLORIDE 0.9 % IV SOLN: 250.0000 mL | INTRAVENOUS | Status: DC | PRN

## 2011-12-29 MED ORDER — MORPHINE SULFATE 2 MG/ML IJ SOLN
1.0000 mg | INTRAMUSCULAR | Status: DC | PRN
  Administered 2011-12-29 – 2011-12-31 (×6): 1 mg via INTRAVENOUS
  Filled 2011-12-29 (×6): qty 1

## 2011-12-29 MED ORDER — IBUPROFEN 400 MG PO TABS
400.0000 mg | ORAL_TABLET | ORAL | Status: DC | PRN
  Administered 2011-12-29 – 2011-12-30 (×3): 400 mg via ORAL
  Filled 2011-12-29 (×3): qty 1

## 2011-12-29 NOTE — Progress Notes (Signed)
Subjective: Patient states that he had rough night, vomited several times, no c/o of HA or dizziness. Appears A/A/O this am.  Did eat breakfast this am, w/o similar c/o.  Objective: Vital signs in last 24 hours: Temp:  [97.3 F (36.3 C)-98 F (36.7 C)] 98 F (36.7 C) (09/21 0625) Pulse Rate:  [69-88] 81  (09/21 0625) Resp:  [7-24] 20  (09/21 0756) BP: (96-129)/(63-83) 96/63 mmHg (09/21 0625) SpO2:  [92 %-100 %] 97 % (09/21 0756) Weight:  [236 lb 8 oz (107.276 kg)] 236 lb 8 oz (107.276 kg) (09/20 1829)    Intake/Output from previous day:   Intake/Output this shift:    General appearance: alert, cooperative, appears stated age and no distress Chest: CTA, decreased at bases. Cardiac: RRR, No M/R/G Abdomen: appears distended, minmal BS this morning,patient states that he is having flatus. Reported several episodes of emesis last p.m. ? Related to PCA use.  H&H stable this am, some hypotention per record 96/63 this morning, HR 81 RR 12-20 Sats running   Lab Results:   Basename 12/28/11 1638 12/28/11 1624  WBC -- 11.8*  HGB 13.6 13.8  HCT 40.0 39.0  PLT -- 223   BMET  Basename 12/28/11 1638  NA 142  K 4.0  CL 106  CO2 --  GLUCOSE 102*  BUN 17  CREATININE 1.10  CALCIUM --   PT/INR No results found for this basename: LABPROT:2,INR:2 in the last 72 hours ABG No results found for this basename: PHART:2,PCO2:2,PO2:2,HCO3:2 in the last 72 hours  Studies/Results: Dg Ribs Unilateral W/chest Left  12/28/2011  *RADIOLOGY REPORT*  Clinical Data: Left chest and scapular pain following a 5 foot fall.  LEFT RIBS AND CHEST - 3+ VIEW  Comparison: 05/02/2011.  Findings: Poor inspiration with mild bibasilar atelectasis. Interval displaced left sixth lateral rib fracture.  Minimally displaced left lateral seventh rib fracture.  No pneumothorax. Small amount of probable pleural fluid on the left.  Grossly normal sized heart.  IMPRESSION:  1.  Displaced left lateral sixth rib fracture  and minimally displaced left lateral seventh rib fracture without pneumothorax. 2.  Poor inspiration with mild bibasilar atelectasis. 3.  Probable minimal left hemothorax.   Original Report Authenticated By: Darrol Angel, M.D.    Dg Cervical Spine Complete  12/28/2011  *RADIOLOGY REPORT*  Clinical Data: Left neck pain following a 5 foot fall.  CERVICAL SPINE - COMPLETE 4+ VIEW  Comparison: None.  Findings: Normal appearing bones and soft tissues without fracture, subluxation or prevertebral soft tissue swelling.  IMPRESSION: Normal examination.   Original Report Authenticated By: Darrol Angel, M.D.    Dg Scapula Left  12/28/2011  *RADIOLOGY REPORT*  Clinical Data: Left scapular pain following a 5 foot fall.  LEFT SCAPULA - 2+ VIEWS  Comparison: Left rib radiographs obtained at the same time.  Findings: Normal appearing scapula without fracture or dislocation. Previously described left sixth and seventh rib fractures.  IMPRESSION:  1.  No scapular fracture or dislocation. 2.  Previously described left sixth and seventh rib fractures.   Original Report Authenticated By: Darrol Angel, M.D.    Ct Head Without Contrast  12/28/2011  *RADIOLOGY REPORT*  Clinical Data: Fall, frontal and posterior headache  CT HEAD WITHOUT CONTRAST  Technique:  Contiguous axial images were obtained from the base of the skull through the vertex without contrast.  Comparison: None.  Findings: No evidence of parenchymal hemorrhage or extra-axial fluid collection. No mass lesion, mass effect, or midline shift.  No  CT evidence of acute infarction.  Cerebral volume is age appropriate.  No ventriculomegaly.  The visualized paranasal sinuses are essentially clear. The mastoid air cells are unopacified. No evidence of calvarial fracture.  IMPRESSION: Normal head CT.   Original Report Authenticated By: Charline Bills, M.D.    Ct Chest W Contrast  12/28/2011  *RADIOLOGY REPORT*  Clinical Data: Fall, shortness of breath, left  chest/back pain  CT CHEST WITH CONTRAST  Technique:  Multidetector CT imaging of the chest was performed following the standard protocol during bolus administration of intravenous contrast.  Contrast: 80mL OMNIPAQUE IOHEXOL 300 MG/ML  SOLN  Comparison: None.  Findings: No evidence of mediastinal hematoma.  Patchy bilateral lower lobe opacities, likely atelectasis, less likely aspiration. No pleural effusion or pneumothorax.  Visualized thyroid is mildly heterogeneous.  The heart is top normal in size.  No pericardial effusion.  No suspicious mediastinal, hilar, or axillary lymphadenopathy.  Visualized upper abdomen grossly unremarkable.  Mildly displaced left lateral 6th rib fracture (series 3/image 30). Nondisplaced left lateral 7th rib (series 3/image 40) and posterior 8th-10th rib fractures (series 3/images 38, 44, and 54).  IMPRESSION: Left posterolateral 6th-10th rib fractures, as described above.   Original Report Authenticated By: Charline Bills, M.D.    Dg Chest Port 1 View  12/29/2011  *RADIOLOGY REPORT*  Clinical Data: Rib fractures.  PORTABLE CHEST - 1 VIEW  Comparison: 12/28/2011  Findings: Low lung volumes are present, causing crowding of the pulmonary vasculature.  The patient has known left-sided rib fractures.  The most radiographically apparent as the displaced left sixth rib fracture posterolaterally.  Borderline cardiomegaly noted.  Mild atelectasis is present in both lung bases.  No pneumothorax.  IMPRESSION:  1.  Left posterolateral rib fractures with mild bibasilar atelectasis and low lung volumes. 2.  Borderline cardiomegaly. 3.  No pneumothorax.   Original Report Authenticated By: Dellia Cloud, M.D.     Anti-infectives: Anti-infectives    None      Assessment/Plan: s/p * No surgery found *   Patient Active Problem List  Diagnosis  . VERTIGO, POSITIONAL  . HYPERTENSION  . INGUINAL HERNIA, RIGHT  . BACK PAIN  . LEG CRAMPS  . SLEEP APNEA  . CAD (coronary artery  disease)  . Unstable angina  . Chest pain  . GERD (gastroesophageal reflux disease)  Multiple left rib fxs -- Needs pain control and pulmonary toilet.  3. Fall  Cervical strain  Lumbar strain -- I doubt any fracture without midline TTP. Treat conservatively.  GERD  HTN  Vertigo  OSA  Plan: 1. Dc PCA change to prn secondary to possible early ileus and hypotention. 2. Ambulate with PT OT    LOS: 1 day    Raybon Conard 12/29/2011

## 2011-12-29 NOTE — Progress Notes (Signed)
I have interviewed and examined this patient. I have reviewed his CT scans and his chest x-ray.  On exam his lungs are fairly clear anteriorly. Also tenderness left lateral chest wall. Abdomen soft except left upper quadrant causes some referred pain to the left chest wall.  I agree that his nausea, vomiting, and itching is most likely due to the morphine. This has now been discontinued. He asked for a laxative and so I ordered one dose of MiraLAX. Incentive spirometry and ambulation encouraged.  I have ordered a two-view chest x-ray and laboratory tomorrow.   Angelia Mould. Derrell Lolling, M.D., [facility] General and Minimally invasive Surgery Breast and Colorectal Surgery Office:   [phone] Pager:   [phone]

## 2011-12-30 ENCOUNTER — Inpatient Hospital Stay (HOSPITAL_COMMUNITY): Payer: 59

## 2011-12-30 DIAGNOSIS — J9819 Other pulmonary collapse: Secondary | ICD-10-CM

## 2011-12-30 DIAGNOSIS — S27329A Contusion of lung, unspecified, initial encounter: Secondary | ICD-10-CM

## 2011-12-30 LAB — CBC
HCT: 37.4 % — ABNORMAL LOW (ref 39.0–52.0)
Hemoglobin: 12.6 g/dL — ABNORMAL LOW (ref 13.0–17.0)
MCH: 32.1 pg (ref 26.0–34.0)
MCHC: 33.7 g/dL (ref 30.0–36.0)
MCV: 95.4 fL (ref 78.0–100.0)

## 2011-12-30 LAB — BASIC METABOLIC PANEL
BUN: 16 mg/dL (ref 6–23)
Calcium: 9 mg/dL (ref 8.4–10.5)
Creatinine, Ser: 1.08 mg/dL (ref 0.50–1.35)
GFR calc non Af Amer: 72 mL/min — ABNORMAL LOW (ref >90)
Glucose, Bld: 105 mg/dL — ABNORMAL HIGH (ref 70–99)

## 2011-12-30 MED ORDER — FUROSEMIDE 10 MG/ML IJ SOLN
40.0000 mg | Freq: Once | INTRAMUSCULAR | Status: AC
  Administered 2011-12-30: 40 mg via INTRAVENOUS
  Filled 2011-12-30: qty 4

## 2011-12-30 MED ORDER — SODIUM CHLORIDE 0.9 % IV SOLN: 250.0000 mL | INTRAVENOUS | Status: DC | PRN

## 2011-12-30 MED ORDER — ALBUTEROL SULFATE (5 MG/ML) 0.5% IN NEBU
2.5000 mg | INHALATION_SOLUTION | Freq: Four times a day (QID) | RESPIRATORY_TRACT | Status: DC
  Administered 2011-12-30 – 2011-12-31 (×4): 2.5 mg via RESPIRATORY_TRACT
  Filled 2011-12-30 (×4): qty 0.5

## 2011-12-30 MED ORDER — ACETAMINOPHEN 10 MG/ML IV SOLN
1000.0000 mg | Freq: Four times a day (QID) | INTRAVENOUS | Status: AC
  Administered 2011-12-30 – 2011-12-31 (×4): 1000 mg via INTRAVENOUS
  Filled 2011-12-30 (×4): qty 100

## 2011-12-30 NOTE — Progress Notes (Signed)
Patient ID: MJ WALT, male   DOB: 12-03-1950, 61 y.o.   MRN: [id number]    Subjective: Patient states he still is sore on left later chest and left upper abdomen , " about same as yesterday" Has had Chest Xray this am results pending.  Objective: Vital signs in last 24 hours: Temp:  [98.2 F (36.8 C)-99.1 F (37.3 C)] 98.3 F (36.8 C) (09/22 0610) Pulse Rate:  [78-93] 84  (09/22 0610) Resp:  [16-20] 18  (09/22 0610) BP: (94-127)/(45-70) 120/64 mmHg (09/22 0610) SpO2:  [97 %-99 %] 97 % (09/22 0610) Last BM Date: 12/28/11  Intake/Output from previous day: 09/21 0701 - 09/22 0700 In: 600 [P.O.:600] Out: 1200 [Urine:1200] Intake/Output this shift:    General appearance: alert, cooperative, appears stated age and no distress Chest: CTA, decreased at bases. Tender in left lateral chest area. Cardiac: RRR, No M/R/G Abdomen: Soft, remains tender LUQ, +BS, some flatus no BM yet VSS, afebrile, HD stable.  Lab Results:   Basename 12/30/11 0500 12/28/11 1638 12/28/11 1624  WBC 8.3 -- 11.8*  HGB 12.6* 13.6 --  HCT 37.4* 40.0 --  PLT 209 -- 223   BMET  Basename 12/30/11 0500 12/28/11 1638  NA 141 142  K 4.5 4.0  CL 106 106  CO2 27 --  GLUCOSE 105* 102*  BUN 16 17  CREATININE 1.08 1.10  CALCIUM 9.0 --   PT/INR No results found for this basename: LABPROT:2,INR:2 in the last 72 hours ABG No results found for this basename: PHART:2,PCO2:2,PO2:2,HCO3:2 in the last 72 hours  Studies/Results: Dg Ribs Unilateral W/chest Left  12/28/2011  *RADIOLOGY REPORT*  Clinical Data: Left chest and scapular pain following a 5 foot fall.  LEFT RIBS AND CHEST - 3+ VIEW  Comparison: 05/02/2011.  Findings: Poor inspiration with mild bibasilar atelectasis. Interval displaced left sixth lateral rib fracture.  Minimally displaced left lateral seventh rib fracture.  No pneumothorax. Small amount of probable pleural fluid on the left.  Grossly normal sized heart.  IMPRESSION:  1.  Displaced left  lateral sixth rib fracture and minimally displaced left lateral seventh rib fracture without pneumothorax. 2.  Poor inspiration with mild bibasilar atelectasis. 3.  Probable minimal left hemothorax.   Original Report Authenticated By: Darrol Angel, M.D.    Dg Cervical Spine Complete  12/28/2011  *RADIOLOGY REPORT*  Clinical Data: Left neck pain following a 5 foot fall.  CERVICAL SPINE - COMPLETE 4+ VIEW  Comparison: None.  Findings: Normal appearing bones and soft tissues without fracture, subluxation or prevertebral soft tissue swelling.  IMPRESSION: Normal examination.   Original Report Authenticated By: Darrol Angel, M.D.    Dg Scapula Left  12/28/2011  *RADIOLOGY REPORT*  Clinical Data: Left scapular pain following a 5 foot fall.  LEFT SCAPULA - 2+ VIEWS  Comparison: Left rib radiographs obtained at the same time.  Findings: Normal appearing scapula without fracture or dislocation. Previously described left sixth and seventh rib fractures.  IMPRESSION:  1.  No scapular fracture or dislocation. 2.  Previously described left sixth and seventh rib fractures.   Original Report Authenticated By: Darrol Angel, M.D.    Ct Head Without Contrast  12/28/2011  *RADIOLOGY REPORT*  Clinical Data: Fall, frontal and posterior headache  CT HEAD WITHOUT CONTRAST  Technique:  Contiguous axial images were obtained from the base of the skull through the vertex without contrast.  Comparison: None.  Findings: No evidence of parenchymal hemorrhage or extra-axial fluid collection. No mass lesion, mass  effect, or midline shift.  No CT evidence of acute infarction.  Cerebral volume is age appropriate.  No ventriculomegaly.  The visualized paranasal sinuses are essentially clear. The mastoid air cells are unopacified. No evidence of calvarial fracture.  IMPRESSION: Normal head CT.   Original Report Authenticated By: Charline Bills, M.D.    Ct Chest W Contrast  12/28/2011  *RADIOLOGY REPORT*  Clinical Data: Fall,  shortness of breath, left chest/back pain  CT CHEST WITH CONTRAST  Technique:  Multidetector CT imaging of the chest was performed following the standard protocol during bolus administration of intravenous contrast.  Contrast: 80mL OMNIPAQUE IOHEXOL 300 MG/ML  SOLN  Comparison: None.  Findings: No evidence of mediastinal hematoma.  Patchy bilateral lower lobe opacities, likely atelectasis, less likely aspiration. No pleural effusion or pneumothorax.  Visualized thyroid is mildly heterogeneous.  The heart is top normal in size.  No pericardial effusion.  No suspicious mediastinal, hilar, or axillary lymphadenopathy.  Visualized upper abdomen grossly unremarkable.  Mildly displaced left lateral 6th rib fracture (series 3/image 30). Nondisplaced left lateral 7th rib (series 3/image 40) and posterior 8th-10th rib fractures (series 3/images 38, 44, and 54).  IMPRESSION: Left posterolateral 6th-10th rib fractures, as described above.   Original Report Authenticated By: Charline Bills, M.D.    Dg Chest Port 1 View  12/29/2011  *RADIOLOGY REPORT*  Clinical Data: Rib fractures.  PORTABLE CHEST - 1 VIEW  Comparison: 12/28/2011  Findings: Low lung volumes are present, causing crowding of the pulmonary vasculature.  The patient has known left-sided rib fractures.  The most radiographically apparent as the displaced left sixth rib fracture posterolaterally.  Borderline cardiomegaly noted.  Mild atelectasis is present in both lung bases.  No pneumothorax.  IMPRESSION:  1.  Left posterolateral rib fractures with mild bibasilar atelectasis and low lung volumes. 2.  Borderline cardiomegaly. 3.  No pneumothorax.   Original Report Authenticated By: Dellia Cloud, M.D.     Anti-infectives: Anti-infectives    None      Assessment/Plan: s/p * No surgery found *   Patient Active Problem List  Diagnosis  . VERTIGO, POSITIONAL  . HYPERTENSION  . INGUINAL HERNIA, RIGHT  . BACK PAIN  . LEG CRAMPS  . SLEEP APNEA   . CAD (coronary artery disease)  . Unstable angina  . Chest pain  . GERD (gastroesophageal reflux disease)  Multiple left rib fxs -- Needs pain control and pulmonary toilet.  3. Fall  Cervical strain  Lumbar strain -- I doubt any fracture without midline TTP. Treat conservatively.  GERD  HTN  Vertigo  OSA  Plan: 1. Ambulate 2. Conservative management of lumbar and cervical strain 3. Pain management 4. Bowel regamin   LOS: 2 days    Nakeitha Milligan 12/30/2011

## 2011-12-30 NOTE — Progress Notes (Signed)
I have interviewed and examined this patient. He is stable.  He is still splinting. Exam revealed some crackles at the right base and diminished breath sounds at the left base.  Chest x-ray shows subsegmental atelectasis and haziness at the left base but no effusion.  I will cut back his IV fluids, give him a single dose of Lasix IV for possible fluid overload, and add nebulizer treatments. I encouraged him to cough and to use his incentive spirometer.   David Vincent. Derrell Lolling, M.D., [facility] General and Minimally invasive Surgery Breast and Colorectal Surgery Office:   [phone] Pager:   [phone]

## 2011-12-31 ENCOUNTER — Inpatient Hospital Stay (HOSPITAL_COMMUNITY): Payer: 59

## 2011-12-31 MED ORDER — OXYCODONE HCL 5 MG PO TABS
5.0000 mg | ORAL_TABLET | ORAL | Status: DC | PRN
  Administered 2011-12-31 – 2012-01-01 (×4): 10 mg via ORAL
  Filled 2011-12-31 (×4): qty 2

## 2011-12-31 MED ORDER — MORPHINE SULFATE 2 MG/ML IJ SOLN
2.0000 mg | INTRAMUSCULAR | Status: DC | PRN
  Administered 2011-12-31: 1 mg via INTRAVENOUS
  Filled 2011-12-31: qty 1

## 2011-12-31 MED ORDER — ALBUTEROL SULFATE (5 MG/ML) 0.5% IN NEBU: 2.5000 mg | INHALATION_SOLUTION | Freq: Four times a day (QID) | RESPIRATORY_TRACT | Status: DC | PRN

## 2011-12-31 MED ORDER — NAPROXEN 500 MG PO TABS
500.0000 mg | ORAL_TABLET | Freq: Two times a day (BID) | ORAL | Status: DC
  Administered 2011-12-31 – 2012-01-01 (×2): 500 mg via ORAL
  Filled 2011-12-31 (×6): qty 1

## 2011-12-31 NOTE — Clinical Social Work Psychosocial (Signed)
     Clinical Social Work Department BRIEF PSYCHOSOCIAL ASSESSMENT 12/31/2011  Patient:  David Vincent, David Vincent     Account Number:  [id number]     Admit date:  12/28/2011  Clinical Social Worker:  Pearson Forster  Date/Time:  12/31/2011 11:30 AM  Referred by:  Physician  Date Referred:  12/31/2011 Referred for  Psychosocial assessment   Other Referral:   Financial concerns with unemployment and [employer] credit hours   Interview type:  Patient Other interview type:   Patient wife and son at the bedside    PSYCHOSOCIAL DATA Living Status:  FAMILY Admitted from facility:   Level of care:   Primary support name:  David Vincent, David Vincent   [phone] Primary support relationship to patient:  SPOUSE Degree of support available:   Strong    CURRENT CONCERNS Current Concerns  Financial Resources   Other Concerns:    SOCIAL WORK ASSESSMENT / PLAN Clinical Social Worker met with patient and patient family at bedside to offer support and discuss patient plans at discharge.  Patient states that he accidentally fell from a ladder which is the reasoning for his hospitalization. Patient lives at home with his wife and son and plans to return home with them once medically ready for discharge.    Patient states that he is unemployed and in order to continue to receive his unemployment check he is in school at [employer] for their training program.  With patient permission, CSW contacted patient contact for unemployment questions(Mr. Gerald Dexter [phone]).  Mr. Gerald Dexter confirmed that if patient is able to have documentation of excused absences from the school he could continue to receive his unemployment check.  With patient permission CSW contacted [employer] and specifically left messages with patient professors Renato Shin [phone] and Fransisca Kaufmann [phone]) to obtain written permission of excused absences on patient behalf.  CSW continues to await call back from patient professors.    Clinical Social  Worker inquired about possible substance use.  Patient states that he has not had a drop of alcohol since his early 20's and has no intentions for any use in the future.  SBIRT complete and no resources needed at this time.    Clinical Social Worker will remain available for support as needed and to facilitate patient discharge needs once medically stable.   Assessment/plan status:  Psychosocial Support/Ongoing Assessment of Needs Other assessment/ plan:   Information/referral to community resources:   Clinical Social Worker contacted patient contacts to continue unemployment payments.  Patient permission was provided and appreciated for contacts made.    PATIENTS/FAMILYS RESPONSE TO PLAN OF CARE: Patient alert and oriented x3 sitting in the chair. Patient began conversation in a depressed mood, however once issues began to resolve patient seemed to be uplifted. Patient has supportive family to return home to and provide care as needed.  Patient and patient family verbalized their appreciation for CSW support and involvement.

## 2011-12-31 NOTE — Progress Notes (Signed)
Patient ID: David Vincent, male   DOB: 1950-10-12, 61 y.o.   MRN: [id number]    Subjective: Patient states he still is sore on left later chest and left upper abdomen , " about same as yesterday" Has had Chest Xray this am results pending.  Objective: Vital signs in last 24 hours: Temp:  [97.8 F (36.6 C)-98.1 F (36.7 C)] 97.8 F (36.6 C) (09/23 0503) Pulse Rate:  [75-84] 75  (09/23 0503) Resp:  [16-19] 19  (09/23 0503) BP: (102-141)/(66-75) 134/74 mmHg (09/23 0503) SpO2:  [95 %-97 %] 96 % (09/23 0503) Last BM Date: 12/30/11  Intake/Output from previous day: 09/22 0701 - 09/23 0700 In: 680 [P.O.:480; IV Piggyback:200] Out: 2150 [Urine:2150] Intake/Output this shift: Total I/O In: 3 [I.V.:3] Out: 150 [Urine:150]  Physical exam:  General appearance: alert, cooperative and no distress Resp: diminished breath sounds bilaterally and Some crackles on left GI: soft, nontender, +BS   Lab Results:   Basename 12/30/11 0500 12/28/11 1638 12/28/11 1624  WBC 8.3 -- 11.8*  HGB 12.6* 13.6 --  HCT 37.4* 40.0 --  PLT 209 -- 223   BMET  Basename 12/30/11 0500 12/28/11 1638  NA 141 142  K 4.5 4.0  CL 106 106  CO2 27 --  GLUCOSE 105* 102*  BUN 16 17  CREATININE 1.08 1.10  CALCIUM 9.0 --   PT/INR No results found for this basename: LABPROT:2,INR:2 in the last 72 hours ABG No results found for this basename: PHART:2,PCO2:2,PO2:2,HCO3:2 in the last 72 hours  Studies/Results: Dg Chest 2 View  12/30/2011  *RADIOLOGY REPORT*  Clinical Data: History of fall with rib fractures.  CHEST - 2 VIEW  Comparison: Chest x-ray 12/29/2011.  Findings: Lung volumes are low.  Bibasilar opacities have increased, likely to represent worsening subsegmental atelectasis. Small left pleural effusion.  Pulmonary venous congestion, accentuated by low lung volumes, without frank pulmonary edema. Borderline cardiomegaly. The patient is rotated to the right on today's exam, resulting in distortion of the  mediastinal contours and reduced diagnostic sensitivity and specificity for mediastinal pathology.  Multiple left-sided rib fractures are noted posterolaterally (sixth, seventh, eighth and ninth ribs; known tenth rib fracture is not well demonstrated on the study).  IMPRESSION: 1.  Multiple left-sided rib fractures again noted with decreasing lung volumes and worsening bibasilar opacities, favored to reflect worsening subsegmental atelectasis. 2.  Probable small left pleural effusion.   Original Report Authenticated By: Florencia Reasons, M.D.     Anti-infectives: Anti-infectives    None      Assessment/Plan: s/p * No surgery found *   Patient Active Problem List  Diagnosis  . VERTIGO, POSITIONAL  . HYPERTENSION  . INGUINAL HERNIA, RIGHT  . BACK PAIN  . LEG CRAMPS  . SLEEP APNEA  . CAD (coronary artery disease)  . Unstable angina  . Chest pain  . GERD (gastroesophageal reflux disease)  Multiple left rib fxs -- Needs pain control and pulmonary toilet.  Fall  Cervical strain  Lumbar strain -- I doubt any fracture without midline TTP. Treat conservatively.  GERD  HTN  Vertigo  OSA  Plan: 1. Ambulate 2.  Pulmonary toiler, repeat cxr today, getting short of breath with talking. 3. Conservative management of lumbar and cervical strain 4. Pain management, still having significant pain at times. 5. Bowel regamin   LOS: 3 days    Kobee Medlen 12/31/2011

## 2011-12-31 NOTE — Progress Notes (Signed)
Still too sore to go.  Doing IS fairly well.  Will see how pain control is tomorrow. I spoke to his family. Patient examined and I agree with the assessment and plan  Violeta Gelinas, MD, MPH, FACS Pager: [phone]  12/31/2011 12:56 PM

## 2012-01-01 DIAGNOSIS — S2242XA Multiple fractures of ribs, left side, initial encounter for closed fracture: Secondary | ICD-10-CM | POA: Diagnosis present

## 2012-01-01 DIAGNOSIS — W11XXXA Fall on and from ladder, initial encounter: Secondary | ICD-10-CM | POA: Diagnosis present

## 2012-01-01 DIAGNOSIS — S060X9A Concussion with loss of consciousness of unspecified duration, initial encounter: Secondary | ICD-10-CM | POA: Diagnosis present

## 2012-01-01 MED ORDER — OXYCODONE-ACETAMINOPHEN 5-325 MG PO TABS: 1.0000 | ORAL_TABLET | ORAL | Status: DC | PRN

## 2012-01-01 MED ORDER — NAPROXEN 500 MG PO TABS: 500.0000 mg | ORAL_TABLET | Freq: Two times a day (BID) | ORAL | Status: DC

## 2012-01-01 NOTE — Progress Notes (Signed)
Patient discharged via wheelchair to home with son and wife. Medications and discharge instructions reviewed with patient and wife. Questions answered to patient satisfaction. Patient does not have any follow up appointments, but discharge instructions included contact information for [facility] who were the health care providers seeing him in the hospital.

## 2012-01-01 NOTE — Progress Notes (Signed)
Patient ID: IZSAK HARTUNIAN, male   DOB: Jun 12, 1950, 61 y.o.   MRN: [id number]   LOS: 4 days   Subjective: Oxy IR worked well, ready to go home.  Objective: Vital signs in last 24 hours: Temp:  [97.7 F (36.5 C)-98.5 F (36.9 C)] 97.7 F (36.5 C) (09/24 0519) Pulse Rate:  [81-93] 81  (09/24 0519) Resp:  [18-20] 18  (09/24 0519) BP: (122-135)/(73-80) 126/73 mmHg (09/24 0519) SpO2:  [94 %-98 %] 94 % (09/24 0519) Last BM Date: 12/31/11  IS:   General appearance: alert and no distress Resp: clear to auscultation bilaterally Cardio: regular rate and rhythm GI: normal findings: bowel sounds normal and soft, non-tender   Assessment/Plan: Fall  Multiple left rib fxs  Concussion  Cervical strain  Lumbar strain -- I doubt any fracture without midline TTP. Treat conservatively.  GERD  HTN  Vertigo  OSA  Dispo -- Home     Freeman Caldron, PA-C Pager: [phone] General Trauma PA Pager: [phone]   01/01/2012

## 2012-01-01 NOTE — Discharge Summary (Signed)
David Masi, MD, MPH, FACS Pager: [phone]  

## 2012-01-01 NOTE — Clinical Social Work Note (Signed)
Clinical Social Worker met with patient and patient wife at bedside to offer continued support.  Patient states that he is excited to return home today with his family.  Patient was able to be in touch with his professor at [employer] and get his make up work to complete at home while recovering from injury.  Patient plans to contact Mr. Rasberry for guidance on continuing his unemployment support while temporarily out of school.  Patient expressed his appreciation for CSW support/concern.  Clinical Social Worker will sign off for now as social work intervention is no longer needed. Please consult [place] again if new need arises.  David Vincent, [place] [phone]

## 2012-01-01 NOTE — Discharge Summary (Signed)
Physician Discharge Summary  Patient ID: David Vincent MRN: [id number] DOB/AGE: Sep 23, 1950 61 y.o.  Admit date: 12/28/2011 Discharge date: 01/01/2012  Discharge Diagnoses Patient Active Problem List   Diagnosis Date Noted  . Fall from ladder 01/01/2012  . Concussion 01/01/2012  . Multiple fractures of ribs of left side 01/01/2012  . GERD (gastroesophageal reflux disease) 05/03/2011  . Chest pain 05/02/2011  . CAD (coronary artery disease)   . Unstable angina   . INGUINAL HERNIA, RIGHT 12/05/2009  . VERTIGO, POSITIONAL 08/08/2009  . LEG CRAMPS 12/03/2008  . SLEEP APNEA 11/16/2008  . BACK PAIN 03/10/2007  . HYPERTENSION 11/15/2006    Consultants None   Procedures None   HPI: [name] was up a ladder about 6 feet pulling a nail out of a rafter. When it came out he lost his balance and fell on hard clay. There was a loss of consciousness for several seconds. When he came to he was gasping for air and complaining of chest pain. He was amnestic to the event. His workup included CT scans of the head and chest as well as x-rays of the cervical spine. He was found to have the 5 left rib fractures and was admitted to the trauma service for pain control and pulmonary toilet.   Hospital Course: [name] suffered no pulmonary complications while in the hospital. His pain was initially controlled with a PCA and then he was transitioned to oral pain medication. He was able to mobilize independently and had no sequelae from his concussion. He was discharged home in good condition in the care of his wife.      Medication List     As of 01/01/2012  8:30 AM    STOP taking these medications         naproxen sodium 220 MG tablet   Commonly known as: ANAPROX      TAKE these medications         EPINEPHrine 0.15 MG/0.3ML injection   Commonly known as: EPIPEN JR   Inject 0.3 mLs (0.15 mg total) into the muscle as needed.      meclizine 25 MG tablet   Commonly known as: ANTIVERT   Take  1 tablet (25 mg total) by mouth 3 (three) times daily as needed.      naproxen 500 MG tablet   Commonly known as: NAPROSYN   Take 1 tablet (500 mg total) by mouth 2 (two) times daily with a meal.      olmesartan 20 MG tablet   Commonly known as: BENICAR   Take 1 tablet (20 mg total) by mouth daily.      omeprazole 20 MG tablet   Commonly known as: PRILOSEC OTC   Take 1 tablet (20 mg total) by mouth daily.      oxyCODONE-acetaminophen 5-325 MG per tablet   Commonly known as: PERCOCET/ROXICET   Take 1-2 tablets by mouth every 4 (four) hours as needed for pain.             Follow-up Information    Call [facility]. (As needed)    Contact information:   [address] [place] [address] [phone]         Signed: Klye Besecker, PA-C Pager: [phone] General Trauma PA Pager: [phone]  01/01/2012, 8:30 AM

## 2012-01-01 NOTE — H&P (Signed)
Pt seen examined and agree with PA note.

## 2012-01-01 NOTE — Progress Notes (Signed)
Ready to D/C Patient examined and I agree with the assessment and plan  Violeta Gelinas, MD, MPH, FACS Pager: [phone]  01/01/2012 9:38 AM

## 2012-01-02 ENCOUNTER — Telehealth (HOSPITAL_COMMUNITY): Payer: Self-pay | Admitting: Orthopedic Surgery

## 2012-01-02 NOTE — Telephone Encounter (Signed)
Left message

## 2012-01-03 ENCOUNTER — Telehealth (HOSPITAL_COMMUNITY): Payer: Self-pay | Admitting: Orthopedic Surgery

## 2012-01-04 NOTE — Telephone Encounter (Signed)
Left message

## 2012-01-08 ENCOUNTER — Encounter: Payer: Self-pay | Admitting: Orthopedic Surgery

## 2012-01-08 ENCOUNTER — Telehealth (HOSPITAL_COMMUNITY): Payer: Self-pay | Admitting: Orthopedic Surgery

## 2012-01-08 MED ORDER — HYDROCODONE-ACETAMINOPHEN 10-325 MG PO TABS: 0.5000 | ORAL_TABLET | ORAL | Status: DC | PRN

## 2012-01-08 NOTE — Telephone Encounter (Signed)
Patient called in for refill on pain meds. I called in Norco 10/325, #20.  He also needed a letter stating he could return to school next week. Will write that and mail to him.

## 2012-01-11 ENCOUNTER — Encounter: Payer: Self-pay | Admitting: Internal Medicine

## 2012-01-11 ENCOUNTER — Ambulatory Visit (INDEPENDENT_AMBULATORY_CARE_PROVIDER_SITE_OTHER): Payer: 59 | Admitting: Internal Medicine

## 2012-01-11 VITALS — BP 118/78 | Temp 98.0°F | Wt 244.0 lb

## 2012-01-11 DIAGNOSIS — I10 Essential (primary) hypertension: Secondary | ICD-10-CM

## 2012-01-11 DIAGNOSIS — S060XAA Concussion with loss of consciousness status unknown, initial encounter: Secondary | ICD-10-CM

## 2012-01-11 DIAGNOSIS — S2249XA Multiple fractures of ribs, unspecified side, initial encounter for closed fracture: Secondary | ICD-10-CM

## 2012-01-11 DIAGNOSIS — W11XXXA Fall on and from ladder, initial encounter: Secondary | ICD-10-CM

## 2012-01-11 DIAGNOSIS — S2242XA Multiple fractures of ribs, left side, initial encounter for closed fracture: Secondary | ICD-10-CM

## 2012-01-11 DIAGNOSIS — S060X9A Concussion with loss of consciousness of unspecified duration, initial encounter: Secondary | ICD-10-CM

## 2012-01-11 MED ORDER — HYDROCODONE-ACETAMINOPHEN 10-325 MG PO TABS: ORAL_TABLET | ORAL | Status: DC

## 2012-01-11 MED ORDER — NAPROXEN 500 MG PO TABS: 500.0000 mg | ORAL_TABLET | Freq: Two times a day (BID) | ORAL | Status: DC

## 2012-01-11 NOTE — Patient Instructions (Addendum)
Call or return to clinic prn if these symptoms worsen or fail to improve as anticipated.

## 2012-01-11 NOTE — Progress Notes (Signed)
Subjective:    Patient ID: David Vincent, male    DOB: 03/03/1951, 61 y.o.   MRN: [id number]  HPI  61 year old patient who is seen today in followup. He was discharged the hospital recently after a fall from a ladder resulting in multiple left-sided rib fractures as well as a concussion. Hospital records reviewed Denies any pulmonary complaints but is still quite uncomfortable but clearly improved. He is now being treated with hydrocodone rather than oxycodone and has done well on this regimen along with naproxen. He continues to use incentive spirometry. He has treated hypertension which has been stable.  Past Medical History  Diagnosis Date  . BACK PAIN 03/10/2007  . HYPERTENSION 11/15/2006  . INGUINAL HERNIA, RIGHT 12/05/2009  . LEG CRAMPS 12/03/2008  . SLEEP APNEA 11/16/2008  . VERTIGO, POSITIONAL 08/08/2009  . ED (erectile dysfunction)   . History of mumps orchitis   . GERD (gastroesophageal reflux disease)   . CAD (coronary artery disease)     A.  01/05/2002 Cath - nonobs dzs (LAD 20prox, RI 30-40 prox)  . Unstable angina     History   Social History  . Marital Status: Married    Spouse Name: N/A    Number of Children: N/A  . Years of Education: N/A   Occupational History  . Not on file.   Social History Main Topics  . Smoking status: Never Smoker   . Smokeless tobacco: Never Used  . Alcohol Use: No  . Drug Use: No  . Sexually Active: Not on file   Other Topics Concern  . Not on file   Social History Narrative   05/02/2011:  Currently unemployed.  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  Does not exercise or adhere to any specific diet.    Past Surgical History  Procedure Date  . Cardiac catheterization   . Knee arthroscopy     Right    Family History  Problem Relation Age of Onset  . Heart attack Father     died 32  . Pneumonia Mother     died 61 - hip fx/pna  . Hypertension Sister     alive  . Cancer Brother     died - agent orange  exposure  . Hypertension Brother     alive    Allergies  Allergen Reactions  . Codeine Phosphate     REACTION: unspecified  . Penicillins     REACTION: unspecified    Current Outpatient Prescriptions on File Prior to Visit  Medication Sig Dispense Refill  . EPINEPHrine (EPIPEN JR) 0.15 MG/0.3ML injection Inject 0.3 mLs (0.15 mg total) into the muscle as needed.  2 each  1  . meclizine (ANTIVERT) 25 MG tablet Take 1 tablet (25 mg total) by mouth 3 (three) times daily as needed.  30 tablet  6  . olmesartan (BENICAR) 20 MG tablet Take 1 tablet (20 mg total) by mouth daily.  90 tablet  6  . omeprazole (PRILOSEC OTC) 20 MG tablet Take 1 tablet (20 mg total) by mouth daily.  90 tablet  6  . oxyCODONE-acetaminophen (ROXICET) 5-325 MG per tablet Take 1-2 tablets by mouth every 4 (four) hours as needed for pain.  60 tablet  0    BP 118/78  Temp 98 F (36.7 C) (Oral)  Wt 244 lb (110.678 kg)       Review of Systems  Constitutional: Negative for fever, chills, appetite change and fatigue.  HENT:  Negative for hearing loss, ear pain, congestion, sore throat, trouble swallowing, neck stiffness, dental problem, voice change and tinnitus.   Eyes: Negative for pain, discharge and visual disturbance.  Respiratory: Negative for cough, chest tightness, wheezing and stridor.   Cardiovascular: Positive for chest pain. Negative for palpitations and leg swelling.  Gastrointestinal: Negative for nausea, vomiting, abdominal pain, diarrhea, constipation, blood in stool and abdominal distention.  Genitourinary: Negative for urgency, hematuria, flank pain, discharge, difficulty urinating and genital sores.  Musculoskeletal: Negative for myalgias, back pain, joint swelling, arthralgias and gait problem.  Skin: Negative for rash.  Neurological: Negative for dizziness, syncope, speech difficulty, weakness, numbness and headaches.  Hematological: Negative for adenopathy. Does not bruise/bleed easily.    Psychiatric/Behavioral: Negative for behavioral problems and dysphoric mood. The patient is not nervous/anxious.        Objective:   Physical Exam  Constitutional: He is oriented to person, place, and time. He appears well-developed.       Blood pressure normal Mildly uncomfortable with movement  HENT:  Head: Normocephalic.  Right Ear: External ear normal.  Left Ear: External ear normal.  Eyes: Conjunctivae normal and EOM are normal.  Neck: Normal range of motion.  Cardiovascular: Normal rate and normal heart sounds.   Pulmonary/Chest: Effort normal and breath sounds normal. No respiratory distress (pulse rate). He has no wheezes. He has no rales.       Symmetrical breath sounds O2 saturation 97% Pulse rate mid 60s  Abdominal: Bowel sounds are normal.  Musculoskeletal: Normal range of motion. He exhibits no edema and no tenderness.  Neurological: He is alert and oriented to person, place, and time.  Psychiatric: He has a normal mood and affect. His behavior is normal.          Assessment & Plan:   Status post multiple left-sided rib fractures. Improved. Hydrocodone refilled as well as naproxen Hypertension stable History concussion stable Coronary artery disease asymptomatic

## 2012-01-17 ENCOUNTER — Telehealth: Payer: Self-pay | Admitting: Internal Medicine

## 2012-01-17 NOTE — Telephone Encounter (Signed)
Caller: [name]/Patient; Patient Name: David Vincent; PCP: Eleonore Chiquito [facility]); Best Callback Phone Number: [phone].   Call regarding straining with bowel movements causing rib pain.  Multiple rib fractures from fall on 12/28/11.   Pt taking Miralax and for the last 5 days he continues to have to strain which is painful.  All emergent symptoms ruled out per Constipation protocol with exception to 'Taking narcotic pain medication or other medication that increases risk of constipation'.  Home care advice given.

## 2012-01-24 ENCOUNTER — Other Ambulatory Visit: Payer: Self-pay | Admitting: Internal Medicine

## 2012-01-24 NOTE — Telephone Encounter (Signed)
Ok 50

## 2012-01-24 NOTE — Telephone Encounter (Signed)
Last seen 01/11/12  Post hospital Last written 01/11/12 #50  0Rf Please advise

## 2012-01-25 ENCOUNTER — Encounter: Payer: Self-pay | Admitting: Internal Medicine

## 2012-01-25 ENCOUNTER — Ambulatory Visit (INDEPENDENT_AMBULATORY_CARE_PROVIDER_SITE_OTHER): Payer: 59 | Admitting: Internal Medicine

## 2012-01-25 VITALS — BP 112/62 | Temp 98.2°F | Wt 244.0 lb

## 2012-01-25 DIAGNOSIS — R079 Chest pain, unspecified: Secondary | ICD-10-CM

## 2012-01-25 DIAGNOSIS — M25519 Pain in unspecified shoulder: Secondary | ICD-10-CM

## 2012-01-25 DIAGNOSIS — I10 Essential (primary) hypertension: Secondary | ICD-10-CM

## 2012-01-25 DIAGNOSIS — S2249XA Multiple fractures of ribs, unspecified side, initial encounter for closed fracture: Secondary | ICD-10-CM

## 2012-01-25 DIAGNOSIS — S2242XA Multiple fractures of ribs, left side, initial encounter for closed fracture: Secondary | ICD-10-CM

## 2012-01-25 DIAGNOSIS — M25512 Pain in left shoulder: Secondary | ICD-10-CM

## 2012-01-25 MED ORDER — HYDROCODONE-ACETAMINOPHEN 10-325 MG PO TABS: 1.0000 | ORAL_TABLET | Freq: Four times a day (QID) | ORAL | Status: DC | PRN

## 2012-01-25 NOTE — Patient Instructions (Addendum)
Call or return to clinic prn if these symptoms worsen or fail to improve as anticipated.

## 2012-01-25 NOTE — Progress Notes (Signed)
Subjective:    Patient ID: David Vincent, male    DOB: 1950-07-11, 61 y.o.   MRN: [id number]  HPI 61 year old patient who has a history of treated hypertension. He was admitted to the hospital briefly approximately 4 weeks ago after trauma resulting in multiple left posterior lateral rib fractures. He is still having some left-sided chest wall discomfort but his chief complaint today is pain in the left shoulder area. He has a difficult time rising from a sitting position out of the chair. Range of motion of the right shoulder is painful. He has a difficult time getting on a shirt and other clothing   Past Medical History  Diagnosis Date  . BACK PAIN 03/10/2007  . HYPERTENSION 11/15/2006  . INGUINAL HERNIA, RIGHT 12/05/2009  . LEG CRAMPS 12/03/2008  . SLEEP APNEA 11/16/2008  . VERTIGO, POSITIONAL 08/08/2009  . ED (erectile dysfunction)   . History of mumps orchitis   . GERD (gastroesophageal reflux disease)   . CAD (coronary artery disease)     A.  01/05/2002 Cath - nonobs dzs (LAD 20prox, RI 30-40 prox)  . Unstable angina     History   Social History  . Marital Status: Married    Spouse Name: N/A    Number of Children: N/A  . Years of Education: N/A   Occupational History  . Not on file.   Social History Main Topics  . Smoking status: Never Smoker   . Smokeless tobacco: Never Used  . Alcohol Use: No  . Drug Use: No  . Sexually Active: Not on file   Other Topics Concern  . Not on file   Social History Narrative   05/02/2011:  Currently unemployed.  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  Does not exercise or adhere to any specific diet.    Past Surgical History  Procedure Date  . Cardiac catheterization   . Knee arthroscopy     Right    Family History  Problem Relation Age of Onset  . Heart attack Father     died 44  . Pneumonia Mother     died 104 - hip fx/pna  . Hypertension Sister     alive  . Cancer Brother     died - agent orange exposure   . Hypertension Brother     alive    Allergies  Allergen Reactions  . Codeine Phosphate     REACTION: unspecified  . Penicillins     REACTION: unspecified    Current Outpatient Prescriptions on File Prior to Visit  Medication Sig Dispense Refill  . EPINEPHrine (EPIPEN JR) 0.15 MG/0.3ML injection Inject 0.3 mLs (0.15 mg total) into the muscle as needed.  2 each  1  . meclizine (ANTIVERT) 25 MG tablet Take 1 tablet (25 mg total) by mouth 3 (three) times daily as needed.  30 tablet  6  . naproxen (NAPROSYN) 500 MG tablet Take 1 tablet (500 mg total) by mouth 2 (two) times daily with a meal.  60 tablet  1  . olmesartan (BENICAR) 20 MG tablet Take 1 tablet (20 mg total) by mouth daily.  90 tablet  6  . omeprazole (PRILOSEC OTC) 20 MG tablet Take 1 tablet (20 mg total) by mouth daily.  90 tablet  6  . oxyCODONE-acetaminophen (ROXICET) 5-325 MG per tablet Take 1-2 tablets by mouth every 4 (four) hours as needed for pain.  60 tablet  0    BP 112/62  Temp 98.2 F (36.8 C) (Oral)  Wt 244 lb (110.678 kg)    Review of Systems  Cardiovascular: Positive for chest pain.  Musculoskeletal: Arthralgias: left shoulder pain.       Objective:   Physical Exam  Constitutional: He appears well-developed and well-nourished. No distress.  Pulmonary/Chest: Effort normal and breath sounds normal.       Few crackles left base Good ventilation No distress  Musculoskeletal:       Decreased range of motion with discomfort Difficult to abduct past 90          Assessment & Plan:   Improving multiple left-sided rib fractures Left shoulder pain with decreased range of motion. Difficult to determine how much pain is related to the multiple rib fractures but suspect patient has a primary shoulder problem. May have a torn rotator cuff. Will observe at this time. If pain persists will likely need orthopedic referral and MRI Hypertension well controlled

## 2012-02-05 ENCOUNTER — Other Ambulatory Visit: Payer: Self-pay | Admitting: Internal Medicine

## 2012-02-21 ENCOUNTER — Other Ambulatory Visit: Payer: Self-pay | Admitting: Orthopedic Surgery

## 2012-02-21 DIAGNOSIS — M25512 Pain in left shoulder: Secondary | ICD-10-CM

## 2012-02-23 ENCOUNTER — Ambulatory Visit
Admission: RE | Admit: 2012-02-23 | Discharge: 2012-02-23 | Disposition: A | Discharge status: Home / Self Care | Payer: 59 | Source: Ambulatory Visit | Attending: Orthopedic Surgery | Admitting: Orthopedic Surgery

## 2012-02-23 DIAGNOSIS — M25512 Pain in left shoulder: Secondary | ICD-10-CM

## 2012-02-24 ENCOUNTER — Other Ambulatory Visit: Payer: 59

## 2012-03-28 ENCOUNTER — Ambulatory Visit (INDEPENDENT_AMBULATORY_CARE_PROVIDER_SITE_OTHER): Payer: 59 | Admitting: Internal Medicine

## 2012-03-28 ENCOUNTER — Encounter: Payer: Self-pay | Admitting: Internal Medicine

## 2012-03-28 VITALS — BP 120/80 | Temp 98.0°F | Wt 250.0 lb

## 2012-03-28 DIAGNOSIS — R079 Chest pain, unspecified: Secondary | ICD-10-CM

## 2012-03-28 DIAGNOSIS — S2242XA Multiple fractures of ribs, left side, initial encounter for closed fracture: Secondary | ICD-10-CM

## 2012-03-28 DIAGNOSIS — S2249XA Multiple fractures of ribs, unspecified side, initial encounter for closed fracture: Secondary | ICD-10-CM

## 2012-03-28 DIAGNOSIS — M549 Dorsalgia, unspecified: Secondary | ICD-10-CM

## 2012-03-28 MED ORDER — ZOLPIDEM TARTRATE 10 MG PO TABS: 10.0000 mg | ORAL_TABLET | Freq: Every evening | ORAL | Status: DC | PRN

## 2012-03-28 NOTE — Progress Notes (Signed)
Subjective:    Patient ID: David Vincent, male    DOB: [birth year], 61 y.o.   MRN: [id number]  HPI  61 year old patient who has treated hypertension. He is seen here today complaining of back left chest wall and left shoulder pain. He has had the left shoulder surgery and is up at this patient in physical therapy. He does have a follow up on with orthopedics in a few days. He is on analgesics muscle relaxers as well as anti-inflammatory medications.  Past Medical History  Diagnosis Date  . BACK PAIN 03/10/2007  . HYPERTENSION 11/15/2006  . INGUINAL HERNIA, RIGHT 12/05/2009  . LEG CRAMPS 12/03/2008  . SLEEP APNEA 11/16/2008  . VERTIGO, POSITIONAL 08/08/2009  . ED (erectile dysfunction)   . History of mumps orchitis   . GERD (gastroesophageal reflux disease)   . CAD (coronary artery disease)     A.  01/05/2002 Cath - nonobs dzs (LAD 20prox, RI 30-40 prox)  . Unstable angina     History   Social History  . Marital Status: Married    Spouse Name: N/A    Number of Children: N/A  . Years of Education: N/A   Occupational History  . Not on file.   Social History Main Topics  . Smoking status: Never Smoker   . Smokeless tobacco: Never Used  . Alcohol Use: No  . Drug Use: No  . Sexually Active: Not on file   Other Topics Concern  . Not on file   Social History Narrative   05/02/2011:  Currently unemployed.  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  Does not exercise or adhere to any specific diet.    Past Surgical History  Procedure Date  . Cardiac catheterization   . Knee arthroscopy     Right    Family History  Problem Relation Age of Onset  . Heart attack Father     died 54  . Pneumonia Mother     died 3 - hip fx/pna  . Hypertension Sister     alive  . Cancer Brother     died - agent orange exposure  . Hypertension Brother     alive    Allergies  Allergen Reactions  . Codeine Phosphate     REACTION: unspecified  . Penicillins     REACTION:  unspecified    Current Outpatient Prescriptions on File Prior to Visit  Medication Sig Dispense Refill  . EPINEPHrine (EPIPEN JR) 0.15 MG/0.3ML injection Inject 0.3 mLs (0.15 mg total) into the muscle as needed.  2 each  1  . HYDROcodone-acetaminophen (NORCO) 10-325 MG per tablet Take 1 tablet by mouth every 6 (six) hours as needed for pain.  60 tablet  1  . meclizine (ANTIVERT) 25 MG tablet Take 1 tablet (25 mg total) by mouth 3 (three) times daily as needed.  30 tablet  6  . olmesartan (BENICAR) 20 MG tablet Take 1 tablet (20 mg total) by mouth daily.  90 tablet  6  . omeprazole (PRILOSEC OTC) 20 MG tablet Take 1 tablet (20 mg total) by mouth daily.  90 tablet  6  . naproxen (NAPROSYN) 500 MG tablet Take 1 tablet (500 mg total) by mouth 2 (two) times daily with a meal.  60 tablet  1  . oxyCODONE-acetaminophen (ROXICET) 5-325 MG per tablet Take 1-2 tablets by mouth every 4 (four) hours as needed for pain.  60 tablet  0    BP 120/80  Temp 98 F (36.7 C) (Oral)  Wt 250 lb (113.399 kg)      Review of Systems  Constitutional: Negative for fever, chills, appetite change and fatigue.  HENT: Negative for hearing loss, ear pain, congestion, sore throat, trouble swallowing, neck stiffness, dental problem, voice change and tinnitus.   Eyes: Negative for pain, discharge and visual disturbance.  Respiratory: Negative for cough, chest tightness, shortness of breath, wheezing and stridor.   Cardiovascular: Positive for chest pain. Negative for palpitations and leg swelling.  Gastrointestinal: Negative for nausea, vomiting, abdominal pain, diarrhea, constipation, blood in stool and abdominal distention.  Genitourinary: Negative for urgency, hematuria, flank pain, discharge, difficulty urinating and genital sores.  Musculoskeletal: Positive for back pain. Negative for myalgias, joint swelling, arthralgias and gait problem.  Skin: Negative for rash.  Neurological: Negative for dizziness, syncope,  speech difficulty, weakness, numbness and headaches.  Hematological: Negative for adenopathy. Does not bruise/bleed easily.  Psychiatric/Behavioral: Negative for behavioral problems and dysphoric mood. The patient is not nervous/anxious.        Objective:   Physical Exam  Constitutional: He is oriented to person, place, and time. He appears well-developed.       Blood pressure 120/80  HENT:  Head: Normocephalic.  Right Ear: External ear normal.  Left Ear: External ear normal.  Eyes: Conjunctivae normal and EOM are normal.  Neck: Normal range of motion.  Cardiovascular: Normal rate and normal heart sounds.   Pulmonary/Chest: Effort normal and breath sounds normal.        Breath sounds equal bilaterally.No distress.  Abdominal: Bowel sounds are normal.  Musculoskeletal: Normal range of motion. He exhibits no edema and no tenderness.  Neurological: He is alert and oriented to person, place, and time.  Psychiatric: He has a normal mood and affect. His behavior is normal.          Assessment & Plan:   Status post multiple left-sided rib fractures Back pain Left shoulder pain status post surgery  Medications refilled Orthopedic followup as scheduled

## 2012-03-28 NOTE — Patient Instructions (Signed)
Orthopedic followup as discussed  Please check your blood pressure on a regular basis.  If it is consistently greater than 150/90, please make an office appointment.  Return in 6 months for follow-up

## 2012-04-04 ENCOUNTER — Encounter: Payer: Self-pay | Admitting: Internal Medicine

## 2012-04-04 ENCOUNTER — Ambulatory Visit (INDEPENDENT_AMBULATORY_CARE_PROVIDER_SITE_OTHER): Payer: 59 | Admitting: Internal Medicine

## 2012-04-04 VITALS — BP 130/80 | HR 94 | Temp 97.8°F | Resp 20 | Wt 250.0 lb

## 2012-04-04 DIAGNOSIS — K649 Unspecified hemorrhoids: Secondary | ICD-10-CM

## 2012-04-04 DIAGNOSIS — I10 Essential (primary) hypertension: Secondary | ICD-10-CM

## 2012-04-04 MED ORDER — HYDROCORTISONE 2.5 % RE CREA: TOPICAL_CREAM | Freq: Two times a day (BID) | RECTAL | Status: DC

## 2012-04-04 NOTE — Patient Instructions (Signed)
Hemorrhoids Hemorrhoids are enlarged (dilated) veins around the rectum. There are 2 types of hemorrhoids, and the type of hemorrhoid is determined by its location.  Internal hemorrhoids occur in the veins just inside the rectum. They are usually not painful, but they may bleed. However, they may poke through to the outside and become irritated and painful. External hemorrhoids involve the veins outside the anus and can be felt as a painful swelling or hard lump near the anus. They are often itchy and may crack and bleed. Sometimes clots will form in the veins. This makes them swollen and painful. These are called thrombosed hemorrhoids. CAUSES Causes of hemorrhoids include:  Pregnancy. This increases the pressure in the hemorrhoidal veins.   Constipation.   Straining to have a bowel movement.   Obesity.   Heavy lifting or other activity that caused you to strain.  TREATMENT Most of the time hemorrhoids improve in 1 to 2 weeks. However, if symptoms do not seem to be getting better or if you have a lot of rectal bleeding, your caregiver may perform a procedure to help make the hemorrhoids get smaller or remove them completely. Possible treatments include:  Rubber band ligation. A rubber band is placed at the base of the hemorrhoid to cut off the circulation.   Sclerotherapy. A chemical is injected to shrink the hemorrhoid.   Infrared light therapy. Tools are used to burn the hemorrhoid.   Hemorrhoidectomy. This is surgical removal of the hemorrhoid.  HOME CARE INSTRUCTIONS    Increase fiber in your diet. Ask your caregiver about using fiber supplements.   Drink enough water and fluids to keep your urine clear or pale yellow.   Exercise regularly.   Go to the bathroom when you have the urge to have a bowel movement. Do not wait.   Avoid straining to have bowel movements.   Keep the anal area dry and clean.   Only take over-the-counter or prescription medicines for pain, discomfort,  or fever as directed by your caregiver.  If your hemorrhoids are thrombosed:  Take warm sitz baths for 20 to 30 minutes, 3 to 4 times per day.   If the hemorrhoids are very tender and swollen, place ice packs on the area as tolerated. Using ice packs between sitz baths may be helpful. Fill a plastic bag with ice. Place a towel between the bag of ice and your skin.   Medicated creams and suppositories may be used or applied as directed.   Do not use a donut-shaped pillow or sit on the toilet for long periods. This increases blood pooling and pain.  SEEK MEDICAL CARE IF:    You have increasing pain and swelling that is not controlled with your medicine.   You have uncontrolled bleeding.   You have difficulty or you are unable to have a bowel movement.   You have pain or inflammation outside the area of the hemorrhoids.   You have chills or an oral temperature above 102 F (38.9 C).  MAKE SURE YOU:    Understand these instructions.   Will watch your condition.   Will get help right away if you are not doing well or get worse.  Document Released: 03/23/2000 Document Revised: 06/18/2011 Document Reviewed: 03/06/2010 [facility] Patient Information 2013 [place], [place].   Sitz Bath A sitz bath is a warm water bath taken in the sitting position that covers only the hips and buttocks. It may be used for either healing or hygiene purposes. Sitz baths are  also used to relieve pain, itching, or muscle spasms. The water may contain medicine. Moist heat will help you heal and relax.   HOME CARE INSTRUCTIONS    Fill the bathtub half full with warm water.   Sit in the water and open the drain a little.   Turn on the warm water to keep the tub half full. Keep the water running constantly.   Soak in the water for 15 to 20 minutes.   After the sitz bath, pat the affected area dry first.   Take 3 to 4 sitz baths a day.  SEEK MEDICAL CARE IF:   You get worse instead of better. Stop the sitz  baths if you get worse. MAKE SURE YOU:  Understand these instructions.   Will watch your condition.   Will get help right away if you are not doing well or get worse.  Document Released: 12/17/2003 Document Revised: 06/18/2011 Document Reviewed: 06/23/2010 [facility] Patient Information 2013 [place], [place].   Constipation, Adult Constipation is when a person has fewer than 3 bowel movements a week; has difficulty having a bowel movement; or has stools that are dry, hard, or larger than normal. As people grow older, constipation is more common. If you try to fix constipation with medicines that make you have a bowel movement (laxatives), the problem may get worse. Long-term laxative use may cause the muscles of the colon to become weak. A low-fiber diet, not taking in enough fluids, and taking certain medicines may make constipation worse. CAUSES    Certain medicines, such as antidepressants, pain medicine, iron supplements, antacids, and water pills.     Certain diseases, such as diabetes, irritable bowel syndrome (IBS), thyroid disease, or depression.     Not drinking enough water.     Not eating enough fiber-rich foods.     Stress or travel.   Lack of physical activity or exercise.   Not going to the restroom when there is the urge to have a bowel movement.   Ignoring the urge to have a bowel movement.   Using laxatives too much.  SYMPTOMS    Having fewer than 3 bowel movements a week.     Straining to have a bowel movement.     Having hard, dry, or larger than normal stools.     Feeling full or bloated.     Pain in the lower abdomen.   Not feeling relief after having a bowel movement.  DIAGNOSIS   Your caregiver will take a medical history and perform a physical exam. Further testing may be done for severe constipation. Some tests may include:    A barium enema X-ray to examine your rectum, colon, and sometimes, your small intestine.   A sigmoidoscopy to examine  your lower colon.   A colonoscopy to examine your entire colon.  TREATMENT   Treatment will depend on the severity of your constipation and what is causing it. Some dietary treatments include drinking more fluids and eating more fiber-rich foods. Lifestyle treatments may include regular exercise. If these diet and lifestyle recommendations do not help, your caregiver may recommend taking over-the-counter laxative medicines to help you have bowel movements. Prescription medicines may be prescribed if over-the-counter medicines do not work.   HOME CARE INSTRUCTIONS    Increase dietary fiber in your diet, such as fruits, vegetables, whole grains, and beans. Limit high-fat and processed sugars in your diet, such as [place] fries, hamburgers, cookies, candies, and soda.     A fiber  supplement may be added to your diet if you cannot get enough fiber from foods.     Drink enough fluids to keep your urine clear or pale yellow.     Exercise regularly or as directed by your caregiver.     Go to the restroom when you have the urge to go. Do not hold it.   Only take medicines as directed by your caregiver. Do not take other medicines for constipation without talking to your caregiver first.  SEEK IMMEDIATE MEDICAL CARE IF:    You have bright red blood in your stool.     Your constipation lasts for more than 4 days or gets worse.     You have abdominal or rectal pain.     You have thin, pencil-like stools.   You have unexplained weight loss.  MAKE SURE YOU:    Understand these instructions.   Will watch your condition.   Will get help right away if you are not doing well or get worse.  Document Released: 12/23/2003 Document Revised: 06/18/2011 Document Reviewed: 02/27/2011 [facility] Patient Information 2013 [place], [place].

## 2012-04-04 NOTE — Progress Notes (Signed)
Subjective:    Patient ID: David Vincent, male    DOB: 1950-12-29, 61 y.o.   MRN: [id number]  HPI  61 year old patient who has been on chronic narcotics following trauma and has had some constipation issues. He presents today complaining of painful hemorrhoids.  Past Medical History  Diagnosis Date  . BACK PAIN 03/10/2007  . HYPERTENSION 11/15/2006  . INGUINAL HERNIA, RIGHT 12/05/2009  . LEG CRAMPS 12/03/2008  . SLEEP APNEA 11/16/2008  . VERTIGO, POSITIONAL 08/08/2009  . ED (erectile dysfunction)   . History of mumps orchitis   . GERD (gastroesophageal reflux disease)   . CAD (coronary artery disease)     A.  01/05/2002 Cath - nonobs dzs (LAD 20prox, RI 30-40 prox)  . Unstable angina     History   Social History  . Marital Status: Married    Spouse Name: N/A    Number of Children: N/A  . Years of Education: N/A   Occupational History  . Not on file.   Social History Main Topics  . Smoking status: Never Smoker   . Smokeless tobacco: Never Used  . Alcohol Use: No  . Drug Use: No  . Sexually Active: Not on file   Other Topics Concern  . Not on file   Social History Narrative   05/02/2011:  Currently unemployed.  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  Does not exercise or adhere to any specific diet.    Past Surgical History  Procedure Date  . Cardiac catheterization   . Knee arthroscopy     Right    Family History  Problem Relation Age of Onset  . Heart attack Father     died 66  . Pneumonia Mother     died 9 - hip fx/pna  . Hypertension Sister     alive  . Cancer Brother     died - agent orange exposure  . Hypertension Brother     alive    Allergies  Allergen Reactions  . Codeine Phosphate     REACTION: unspecified  . Penicillins     REACTION: unspecified    Current Outpatient Prescriptions on File Prior to Visit  Medication Sig Dispense Refill  . EPINEPHrine (EPIPEN JR) 0.15 MG/0.3ML injection Inject 0.3 mLs (0.15 mg total)  into the muscle as needed.  2 each  1  . HYDROcodone-acetaminophen (NORCO) 10-325 MG per tablet Take 1 tablet by mouth every 6 (six) hours as needed for pain.  60 tablet  1  . meclizine (ANTIVERT) 25 MG tablet Take 1 tablet (25 mg total) by mouth 3 (three) times daily as needed.  30 tablet  6  . methocarbamol (ROBAXIN) 750 MG tablet Take 750 mg by mouth 4 (four) times daily.      . naproxen (NAPROSYN) 500 MG tablet Take 1 tablet (500 mg total) by mouth 2 (two) times daily with a meal.  60 tablet  1  . olmesartan (BENICAR) 20 MG tablet Take 1 tablet (20 mg total) by mouth daily.  90 tablet  6  . omeprazole (PRILOSEC OTC) 20 MG tablet Take 1 tablet (20 mg total) by mouth daily.  90 tablet  6  . zolpidem (AMBIEN) 10 MG tablet Take 1 tablet (10 mg total) by mouth at bedtime as needed for sleep.  30 tablet  0  . oxyCODONE-acetaminophen (ROXICET) 5-325 MG per tablet Take 1-2 tablets by mouth every 4 (four) hours as needed for pain.  60  tablet  0    BP 130/80  Pulse 94  Temp 97.8 F (36.6 C) (Oral)  Resp 20  Wt 250 lb (113.399 kg)  SpO2 97%       Review of Systems  Gastrointestinal: Positive for rectal pain.       Objective:   Physical Exam  Constitutional: He appears well-developed and well-nourished. No distress.          Assessment & Plan:   Symptomatic hemorrhoids. Will treat with Anusol HC cream Constipation issues addressed Sitz baths

## 2013-03-16 ENCOUNTER — Ambulatory Visit: Payer: Self-pay

## 2013-03-16 ENCOUNTER — Other Ambulatory Visit: Payer: Self-pay | Admitting: Occupational Medicine

## 2013-03-16 DIAGNOSIS — R52 Pain, unspecified: Secondary | ICD-10-CM

## 2013-03-19 ENCOUNTER — Ambulatory Visit (INDEPENDENT_AMBULATORY_CARE_PROVIDER_SITE_OTHER): Payer: BC Managed Care – PPO | Admitting: Family Medicine

## 2013-03-19 VITALS — BP 128/80 | Temp 98.3°F | Wt 256.0 lb

## 2013-03-19 DIAGNOSIS — H8110 Benign paroxysmal vertigo, unspecified ear: Secondary | ICD-10-CM

## 2013-03-19 DIAGNOSIS — I10 Essential (primary) hypertension: Secondary | ICD-10-CM

## 2013-03-19 DIAGNOSIS — J069 Acute upper respiratory infection, unspecified: Secondary | ICD-10-CM

## 2013-03-19 MED ORDER — MECLIZINE HCL 25 MG PO TABS: 25.0000 mg | ORAL_TABLET | Freq: Three times a day (TID) | ORAL | Status: DC | PRN

## 2013-03-19 NOTE — Progress Notes (Signed)
Chief Complaint  Patient presents with  . Hypertension  . URI    HPI:  David Vincent is a 62 yo M patient of Dr. Amador Cunas here for acute visit for:  HTN: -was told to follow up but didn't and wanted to check BP today  URI: -"cold" started 1 week ago, not bad -runny nose, cough, drainage, ear pain -denies: fever, NVD, SOB, tooth pain, body aches, flu exposure or travel or ebola risks  BPPV: -dx a long time ago and symptoms occur infrequently but when has a flare uses meclizine -not having symptoms now -needs refill on this med as it expired   ROS: See pertinent positives and negatives per HPI.  Past Medical History  Diagnosis Date  . BACK PAIN 03/10/2007  . HYPERTENSION 11/15/2006  . INGUINAL HERNIA, RIGHT 12/05/2009  . LEG CRAMPS 12/03/2008  . SLEEP APNEA 11/16/2008  . VERTIGO, POSITIONAL 08/08/2009  . ED (erectile dysfunction)   . History of mumps orchitis   . GERD (gastroesophageal reflux disease)   . CAD (coronary artery disease)     A.  01/05/2002 Cath - nonobs dzs (LAD 20prox, RI 30-40 prox)  . Unstable angina     Past Surgical History  Procedure Laterality Date  . Cardiac catheterization    . Knee arthroscopy      Right    Family History  Problem Relation Age of Onset  . Heart attack Father     died 34  . Pneumonia Mother     died 13 - hip fx/pna  . Hypertension Sister     alive  . Cancer Brother     died - agent orange exposure  . Hypertension Brother     alive    History   Social History  . Marital Status: Married    Spouse Name: N/A    Number of Children: N/A  . Years of Education: N/A   Social History Main Topics  . Smoking status: Never Smoker   . Smokeless tobacco: Never Used  . Alcohol Use: No  . Drug Use: No  . Sexual Activity: Not on file   Other Topics Concern  . Not on file   Social History Narrative   05/02/2011:  Currently unemployed.  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  Does not exercise or adhere to any  specific diet.    Current outpatient prescriptions:EPINEPHrine (EPIPEN JR) 0.15 MG/0.3ML injection, Inject 0.3 mLs (0.15 mg total) into the muscle as needed., Disp: 2 each, Rfl: 1;  olmesartan (BENICAR) 20 MG tablet, Take 1 tablet (20 mg total) by mouth daily., Disp: 90 tablet, Rfl: 6;  omeprazole (PRILOSEC OTC) 20 MG tablet, Take 1 tablet (20 mg total) by mouth daily., Disp: 90 tablet, Rfl: 6 oxyCODONE-acetaminophen (ROXICET) 5-325 MG per tablet, Take 1-2 tablets by mouth every 4 (four) hours as needed for pain., Disp: 60 tablet, Rfl: 0;  meclizine (ANTIVERT) 25 MG tablet, Take 1 tablet (25 mg total) by mouth 3 (three) times daily as needed., Disp: 30 tablet, Rfl: 0;  zolpidem (AMBIEN) 10 MG tablet, Take 1 tablet (10 mg total) by mouth at bedtime as needed for sleep., Disp: 30 tablet, Rfl: 0  EXAM:  Filed Vitals:   03/19/13 1258  BP: 128/80  Temp: 98.3 F (36.8 C)    Body mass index is 36.73 kg/(m^2).  GENERAL: vitals reviewed and listed above, alert, oriented, appears well hydrated and in no acute distress  HEENT: atraumatic, conjunttiva clear, no obvious  abnormalities on inspection of external nose and ears, normal appearance of ear canals and TMs, clear nasal congestion, mild post oropharyngeal erythema with PND, no tonsillar edema or exudate, no sinus TTP  NECK: no obvious masses on inspection  LUNGS: clear to auscultation bilaterally, no wheezes, rales or rhonchi, good air movement  CV: HRRR, no peripheral edema  MS: moves all extremities without noticeable abnormality  PSYCH: pleasant and cooperative, no obvious depression or anxiety  ASSESSMENT AND PLAN:  Discussed the following assessment and plan:  HYPERTENSION  VERTIGO, POSITIONAL  Upper respiratory infection  -BP great today - cont current tx -meclizine refilled -see recs below for likely mild recovering VURI discussed risks of treatment and return precuations -Patient advised to return or notify a doctor  immediately if symptoms worsen or persist or new concerns arise.  Patient Instructions  INSTRUCTIONS FOR UPPER RESPIRATORY INFECTION:  -plenty of rest and fluids  -nasal saline wash 2-3 times daily (use prepackaged nasal saline or bottled/distilled water if making your own)   -can use sinex nasal spray for drainage and nasal congestion - but do NOT use longer then 3-4 days  -can use tylenol or ibuprofen as directed for aches and sorethroat  -in the winter time, using a humidifier at night is helpful (please follow cleaning instructions)  -if you are taking a cough medication - use only as directed, may also try a teaspoon of honey to coat the throat and throat lozenges  -for sore throat, salt water gargles can help  -follow up if you have fevers, facial pain, tooth pain, difficulty breathing or are worsening or not getting better in 5-7 days      Avalynne Diver R.

## 2013-03-19 NOTE — Patient Instructions (Signed)

## 2013-03-19 NOTE — Progress Notes (Signed)
Pre visit review using our clinic review tool, if applicable. No additional management support is needed unless otherwise documented below in the visit note. 

## 2013-04-03 ENCOUNTER — Telehealth: Payer: Self-pay | Admitting: Internal Medicine

## 2013-04-03 NOTE — Telephone Encounter (Signed)
Patient Information:  Caller Name: Keshawn  Phone: [phone]  Patient: David Vincent, David Vincent  Gender: Male  DOB: 1950/10/21  Age: 62 Years  PCP: Eleonore Chiquito (Family Practice > 56yrs old)  Office Follow Up:  Does the office need to follow up with this patient?: No  Instructions For The Office: N/A  RN Note:  Patient refuses ED or UC, he states that she cannot afford it.  Office appts are full.  Stressed to him the importance of being seen.  Advised him that the ED will not turn him away based on his inability to pay.  He still refused.  Symptoms  Reason For Call & Symptoms: Cough and congestion.  No fever.  Reviewed Health History In EMR: Yes  Reviewed Medications In EMR: Yes  Reviewed Allergies In EMR: Yes  Reviewed Surgeries / Procedures: Yes  Date of Onset of Symptoms: 03/31/2013  Treatments Tried: Delsym, Musinex  Treatments Tried Worked: No  Guideline(s) Used:  Cough  Disposition Per Guideline:   Go to Office Now  Reason For Disposition Reached:   Wheezing is present  Advice Given:  Reassurance  Coughing is the way that our lungs remove irritants and mucus. It helps protect our lungs from getting pneumonia.  You can get a dry hacking cough after a chest cold. Sometimes this type of cough can last 1-3 weeks, and be worse at night.  You can also get a cough after being exposed to irritating substances like smoke, strong perfumes, and dust.  Here is some care advice that should help.  OTC Cough Syrup - Dextromethorphan:  Cough syrups containing the cough suppressant dextromethorphan (DM) may help decrease your cough. Cough syrups work best for coughs that keep you awake at night. They can also sometimes help in the late stages of a respiratory infection when the cough is dry and hacking. They can be used along with cough drops.  Coughing Spasms:  Drink warm fluids. Inhale warm mist (Reason: both relax the airway and loosen up the phlegm).  Suck on cough drops or hard  candy to coat the irritated throat.  Prevent Dehydration:  Drink adequate liquids.  This will help soothe an irritated or dry throat and loosen up the phlegm.  Expected Course:   The expected course depends on what is causing the cough.  Viral bronchitis (chest cold) causes a cough that lasts 1 to 3 weeks. Sometimes you may cough up lots of phlegm (sputum, mucus). The mucus can normally be white, gray, yellow, or green.  Call Back If:  Difficulty breathing  Cough lasts more than 3 weeks  Fever lasts > 3 days  You become worse.  Patient Refused Recommendation:  Patient Refused Care Advice  States he does not have the money to go to an UC or ED.  RN advised pt that he will not be turned away due to his inability to pay at the ER.  He continued to refuse.

## 2013-04-03 NOTE — Telephone Encounter (Signed)
Pt scheduled for Saturday clinic tomorrow

## 2013-04-04 ENCOUNTER — Encounter: Payer: Self-pay | Admitting: Internal Medicine

## 2013-04-04 ENCOUNTER — Ambulatory Visit (INDEPENDENT_AMBULATORY_CARE_PROVIDER_SITE_OTHER): Payer: BC Managed Care – PPO | Admitting: Internal Medicine

## 2013-04-04 VITALS — BP 134/72 | HR 89 | Temp 101.0°F | Ht 68.0 in | Wt 255.0 lb

## 2013-04-04 DIAGNOSIS — J019 Acute sinusitis, unspecified: Secondary | ICD-10-CM

## 2013-04-04 DIAGNOSIS — R05 Cough: Secondary | ICD-10-CM

## 2013-04-04 DIAGNOSIS — I10 Essential (primary) hypertension: Secondary | ICD-10-CM

## 2013-04-04 MED ORDER — HYDROCODONE-HOMATROPINE 5-1.5 MG/5ML PO SYRP: 5.0000 mL | ORAL_SOLUTION | Freq: Four times a day (QID) | ORAL | Status: DC | PRN

## 2013-04-04 MED ORDER — LEVOFLOXACIN 250 MG PO TABS: 250.0000 mg | ORAL_TABLET | Freq: Every day | ORAL | Status: DC

## 2013-04-04 NOTE — Assessment & Plan Note (Signed)
Mild to mod, for antibx course,  to f/u any worsening symptoms or concerns 

## 2013-04-04 NOTE — Progress Notes (Signed)
   Subjective:    Patient ID: David Vincent, male    DOB: [birth year], 62 y.o.   MRN: [id number]  HPI  Here with 2-3 days acute onset fever, facial pain, pressure, headache, general weakness and malaise, and greenish d/c, with mild ST and cough, but pt denies chest pain, wheezing, increased sob or doe, orthopnea, PND, increased LE swelling, palpitations, dizziness or syncope. Pt denies new neurological symptoms such as new headache, or facial or extremity weakness or numbness   Pt denies polydipsia, polyuria,  Past Medical History  Diagnosis Date  . BACK PAIN 03/10/2007  . HYPERTENSION 11/15/2006  . INGUINAL HERNIA, RIGHT 12/05/2009  . LEG CRAMPS 12/03/2008  . SLEEP APNEA 11/16/2008  . VERTIGO, POSITIONAL 08/08/2009  . ED (erectile dysfunction)   . History of mumps orchitis   . GERD (gastroesophageal reflux disease)   . CAD (coronary artery disease)     A.  01/05/2002 Cath - nonobs dzs (LAD 20prox, RI 30-40 prox)  . Unstable angina    Past Surgical History  Procedure Laterality Date  . Cardiac catheterization    . Knee arthroscopy      Right    reports that he has never smoked. He has never used smokeless tobacco. He reports that he does not drink alcohol or use illicit drugs. family history includes Cancer in his brother; Heart attack in his father; Hypertension in his brother and sister; Pneumonia in his mother. Allergies  Allergen Reactions  . Codeine Phosphate     REACTION: unspecified  . Penicillins     REACTION: unspecified   Review of Systems  Constitutional: Negative for unexpected weight change, or unusual diaphoresis  HENT: Negative for tinnitus.   Eyes: Negative for photophobia and visual disturbance.  Respiratory: Negative for choking and stridor.   Gastrointestinal: Negative for vomiting and blood in stool.  Genitourinary: Negative for hematuria and decreased urine volume.  Musculoskeletal: Negative for acute joint swelling Skin: Negative for color change and wound.    Neurological: Negative for tremors and numbness other than noted  Psychiatric/Behavioral: Negative for decreased concentration or  hyperactivity.       Objective:   Physical Exam BP 134/72  Pulse 89  Temp(Src) 101 F (38.3 C) (Oral)  Ht 5\' 8"  (1.727 m)  Wt 255 lb (115.667 kg)  BMI 38.78 kg/m2  SpO2 97% VS noted, mild ill Constitutional: Pt appears well-developed and well-nourished.  HENT: Head: NCAT.  Right Ear: External ear normal.  Left Ear: External ear normal.  Eyes: Conjunctivae and EOM are normal. Pupils are equal, round, and reactive to light.  Neck: Normal range of motion. Neck supple.  Cardiovascular: Normal rate and regular rhythm.   Bilat tm's with mild erythema.  Max sinus areas mild tender.  Pharynx with mild erythema, no exudate Pulmonary/Chest: Effort normal and breath sounds normal.  - no rales or wheezing Neurological: Pt is alert. Not confused  Skin: Skin is warm. No erythema.  Psychiatric: Pt behavior is normal. Thought content normal.     Assessment & Plan:

## 2013-04-04 NOTE — Progress Notes (Signed)
Pre-visit discussion using our clinic review tool. No additional management support is needed unless otherwise documented below in the visit note.  

## 2013-04-04 NOTE — Patient Instructions (Signed)
Please take all new medication as prescribed - the antibiotic, and cough medicine Please continue all other medications as before You can also take Delsym OTC for cough, and/or Mucinex (or it's generic off brand) for congestion, and tylenol as needed for pain.  Please remember to sign up for My Chart if you have not done so, as this will be important to you in the future with finding out test results, communicating by private email, and scheduling acute appointments online when needed.

## 2013-04-04 NOTE — Assessment & Plan Note (Signed)
stable overall by history and exam, recent data reviewed with pt, and pt to continue medical treatment as before,  to f/u any worsening symptoms or concerns BP Readings from Last 3 Encounters:  04/04/13 134/72  03/19/13 128/80  04/04/12 130/80

## 2013-04-04 NOTE — Assessment & Plan Note (Signed)
Most likely due to upper resp infection, doubt bronchitis/pna and no apparent wheezing, hold on cxr for now but to consider if worse, and for cough med prn

## 2013-05-15 IMAGING — CR DG CERVICAL SPINE COMPLETE 4+V
6 series · 6 of 6 positions shown · non-contrast
Comparison: None.

CLINICAL DATA: Left neck pain following a 5 foot fall.

CERVICAL SPINE - COMPLETE 4+ VIEW

[t c-spine a.p.]
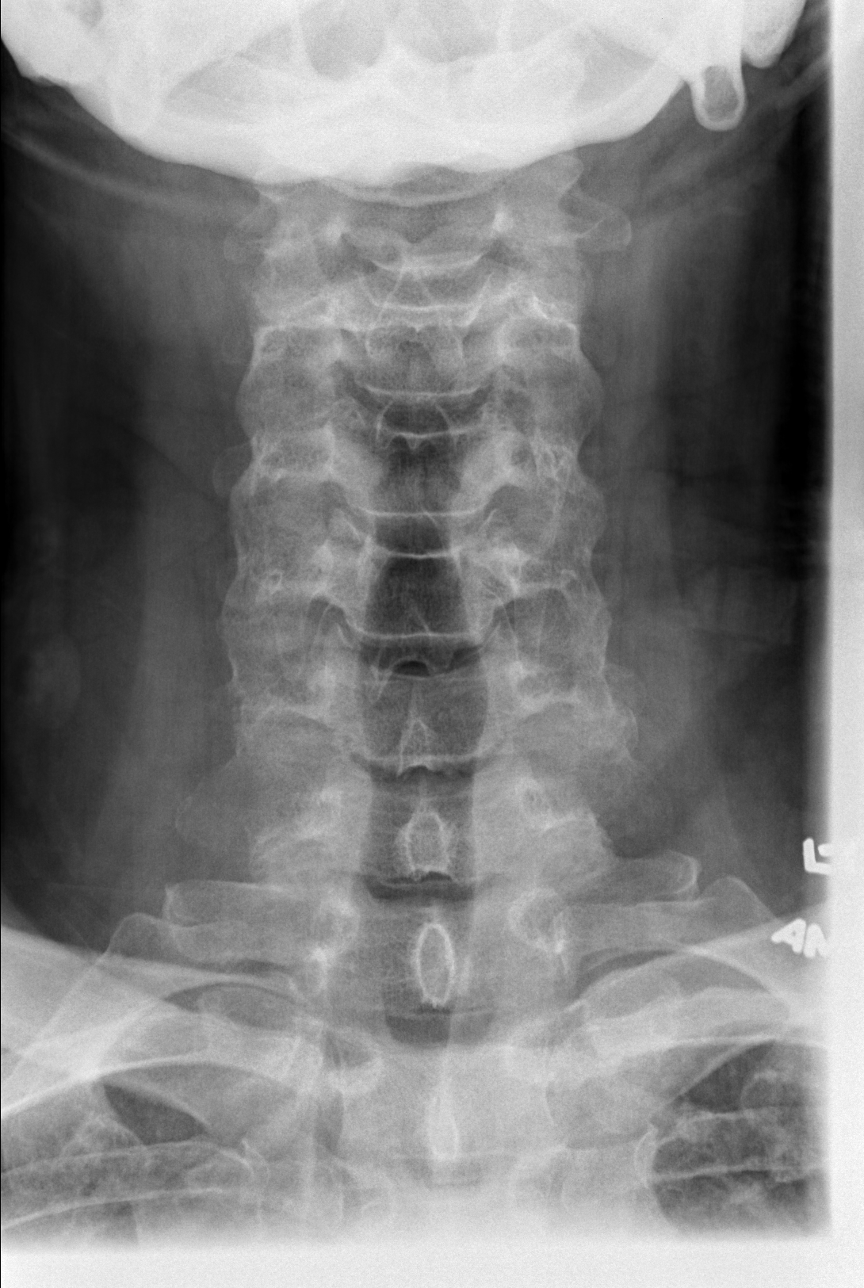

[t c-spine odontoid]
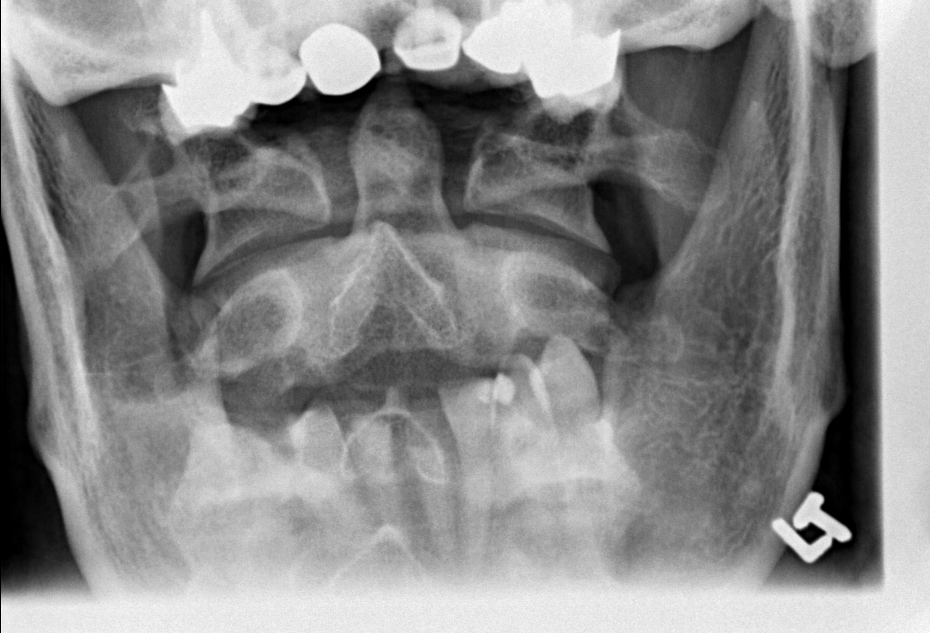

[w c-spine lat]
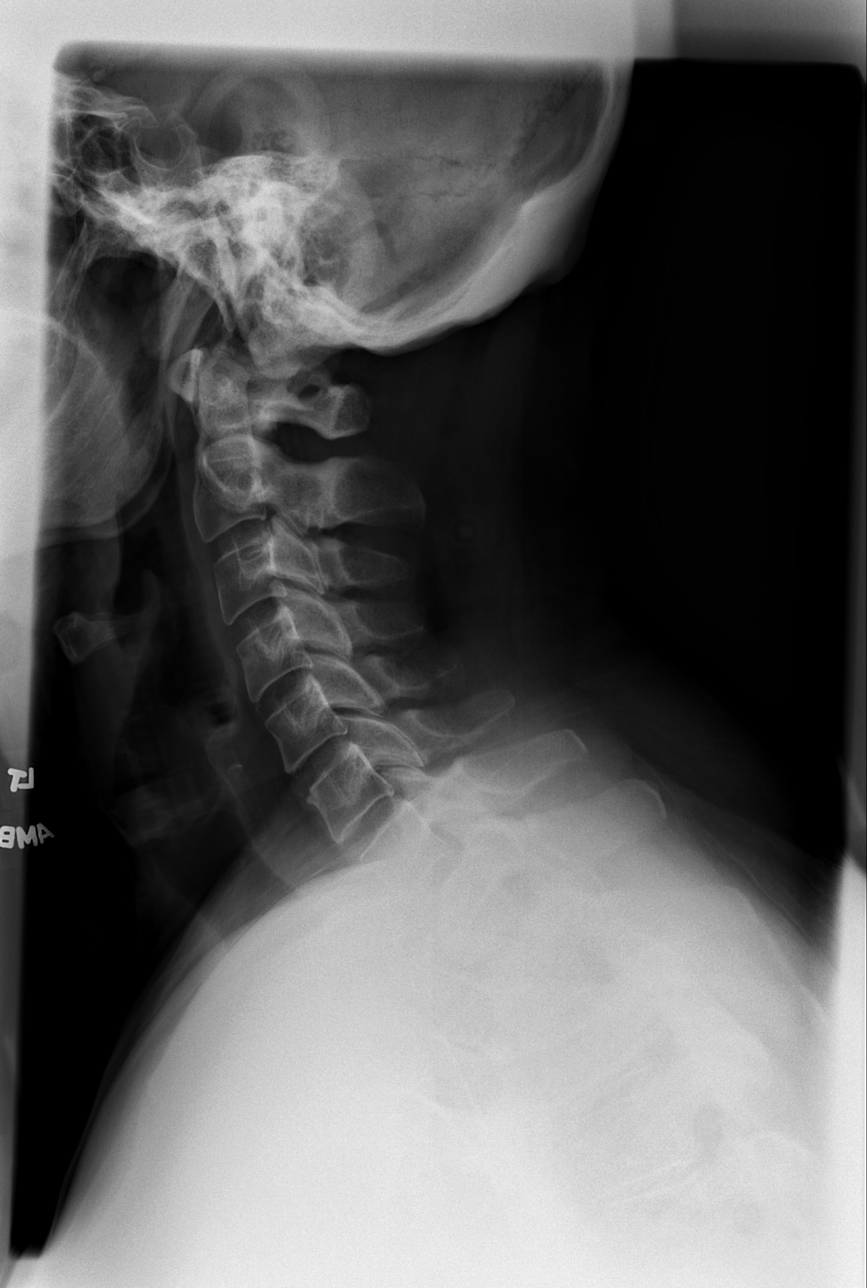

[w c-spine oblique (1 of 2)]
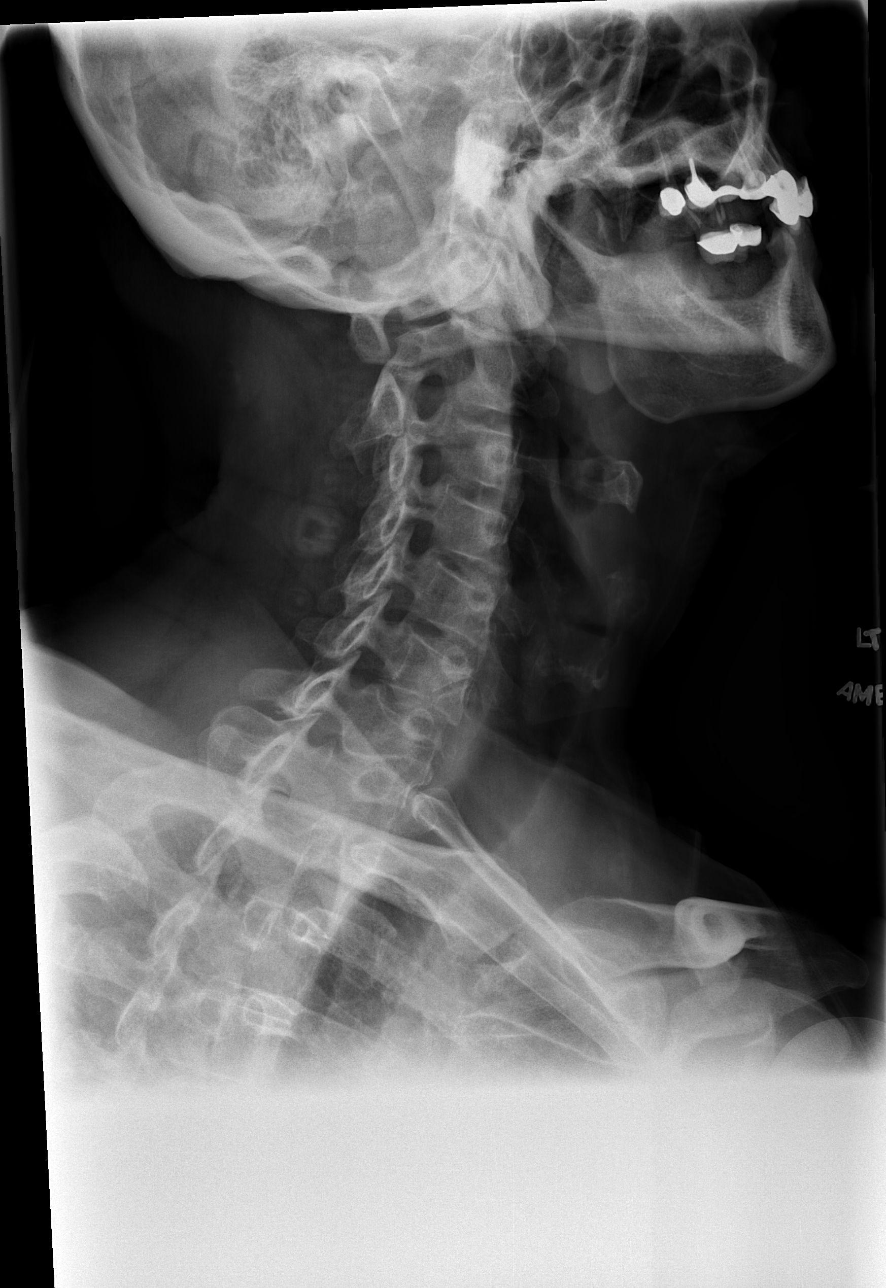

[w c-spine oblique (2 of 2)]
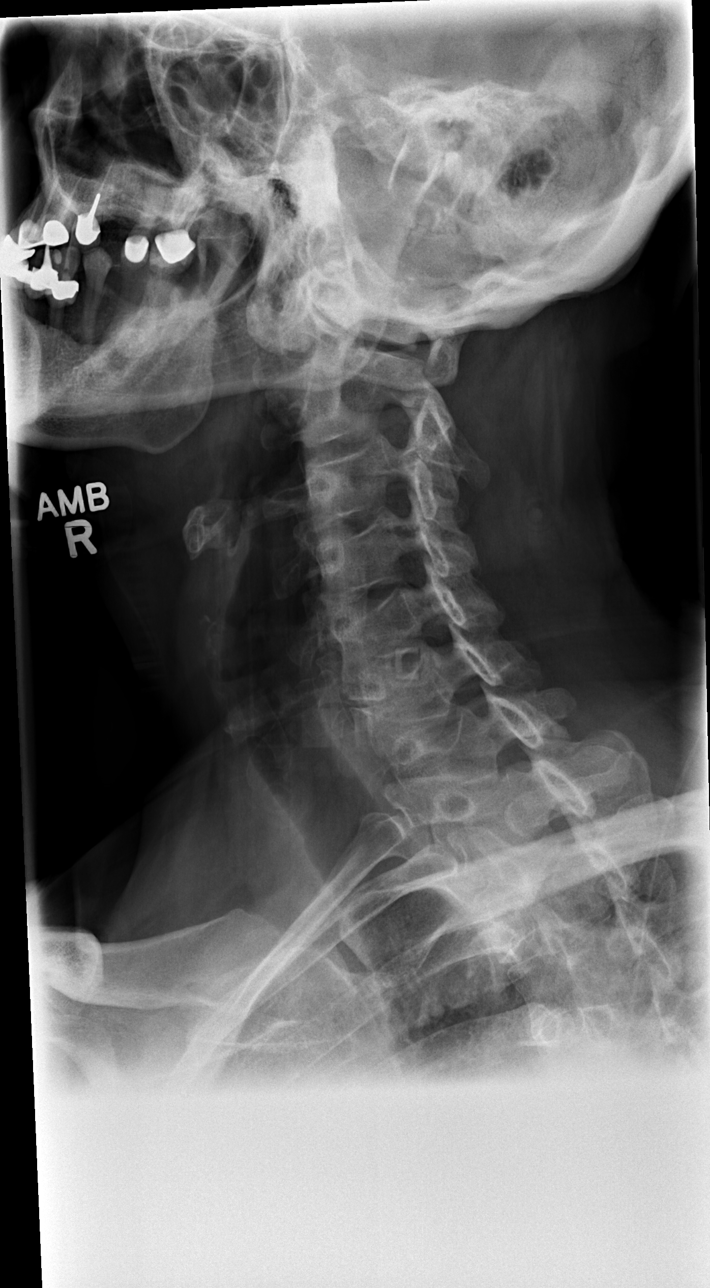

[w swimmers view *]
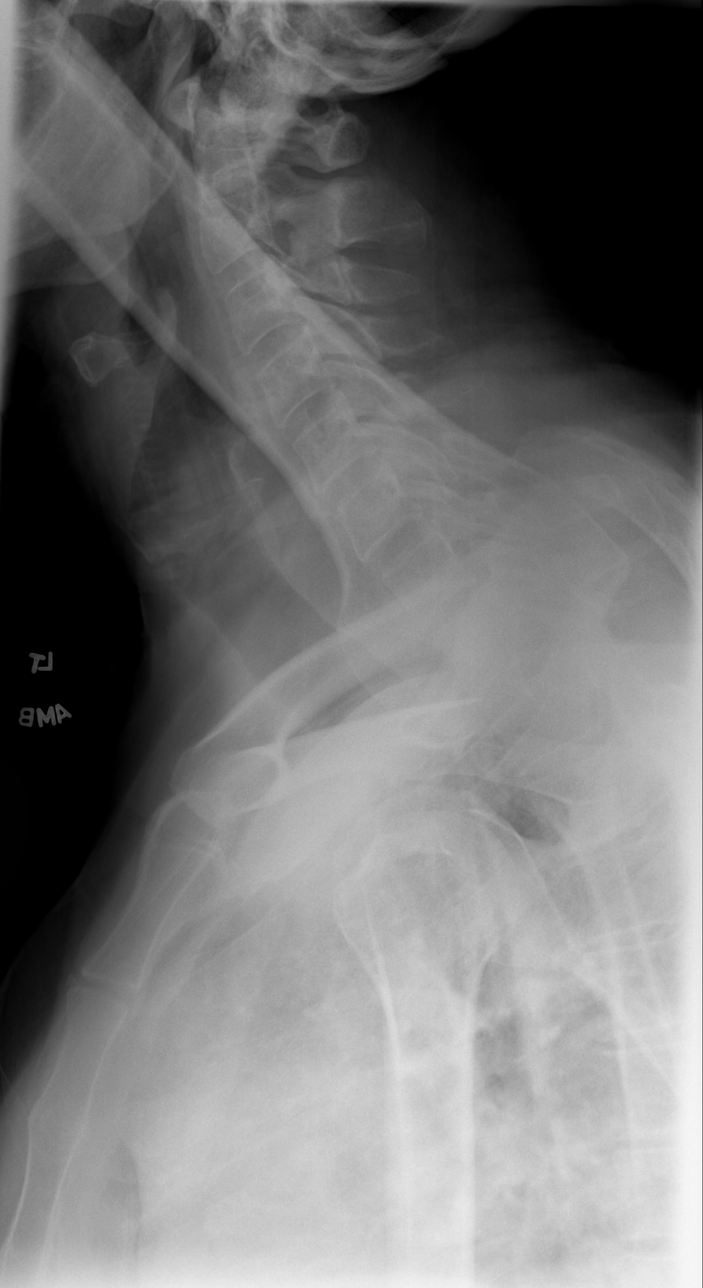

[6 of 6 positions shown; findings below may reference images not displayed]

FINDINGS: Normal appearing bones and soft tissues without fracture,
subluxation or prevertebral soft tissue swelling.
IMPRESSION: Normal examination.

## 2013-12-08 ENCOUNTER — Ambulatory Visit (INDEPENDENT_AMBULATORY_CARE_PROVIDER_SITE_OTHER): Payer: BC Managed Care – PPO | Admitting: Internal Medicine

## 2013-12-08 ENCOUNTER — Encounter: Payer: Self-pay | Admitting: Internal Medicine

## 2013-12-08 VITALS — BP 138/84 | HR 88 | Temp 98.2°F | Resp 20 | Ht 68.0 in | Wt 263.0 lb

## 2013-12-08 DIAGNOSIS — I10 Essential (primary) hypertension: Secondary | ICD-10-CM

## 2013-12-08 DIAGNOSIS — R05 Cough: Secondary | ICD-10-CM

## 2013-12-08 DIAGNOSIS — R059 Cough, unspecified: Secondary | ICD-10-CM

## 2013-12-08 MED ORDER — HYDROCODONE-HOMATROPINE 5-1.5 MG/5ML PO SYRP: 5.0000 mL | ORAL_SOLUTION | Freq: Four times a day (QID) | ORAL | Status: DC | PRN

## 2013-12-08 MED ORDER — OLMESARTAN MEDOXOMIL 20 MG PO TABS: 20.0000 mg | ORAL_TABLET | Freq: Every day | ORAL | Status: DC

## 2013-12-08 MED ORDER — EPINEPHRINE 0.3 MG/0.3ML IJ SOAJ: 0.3000 mg | Freq: Once | INTRAMUSCULAR | Status: DC

## 2013-12-08 NOTE — Progress Notes (Signed)
Pre visit review using our clinic review tool, if applicable. No additional management support is needed unless otherwise documented below in the visit note. 

## 2013-12-08 NOTE — Patient Instructions (Signed)
Acute bronchitis symptoms for less than 10 days are generally not helped by antibiotics.  Take over-the-counter expectorants and cough medications such as  Mucinex DM.  Call if there is no improvement in 5 to 7 days or if  you develop worsening cough, fever, or new symptoms, such as shortness of breath or chest pain.  Limit your sodium (Salt) intake  Please check your blood pressure on a regular basis.  If it is consistently greater than 150/90, please make an office appointment.  Return in 3 months for follow-up

## 2013-12-08 NOTE — Progress Notes (Signed)
Subjective:    Patient ID: David Vincent, male    DOB: 10-Nov-1950, 63 y.o.   MRN: [id number]  HPI  63 year old patient who has treated hypertension.  He presents with a three-day history of chest congestion and cough.  No fever, wheezing, or shortness of breath.  Nonsmoker.   Past Medical History  Diagnosis Date  . BACK PAIN 03/10/2007  . HYPERTENSION 11/15/2006  . INGUINAL HERNIA, RIGHT 12/05/2009  . LEG CRAMPS 12/03/2008  . SLEEP APNEA 11/16/2008  . VERTIGO, POSITIONAL 08/08/2009  . ED (erectile dysfunction)   . History of mumps orchitis   . GERD (gastroesophageal reflux disease)   . CAD (coronary artery disease)     A.  01/05/2002 Cath - nonobs dzs (LAD 20prox, RI 30-40 prox)  . Unstable angina     History   Social History  . Marital Status: Married    Spouse Name: N/A    Number of Children: N/A  . Years of Education: N/A   Occupational History  . Not on file.   Social History Main Topics  . Smoking status: Never Smoker   . Smokeless tobacco: Never Used  . Alcohol Use: No  . Drug Use: No  . Sexual Activity: Not on file   Other Topics Concern  . Not on file   Social History Narrative   05/02/2011:  Currently unemployed.  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  Does not exercise or adhere to any specific diet.    Past Surgical History  Procedure Laterality Date  . Cardiac catheterization    . Knee arthroscopy      Right    Family History  Problem Relation Age of Onset  . Heart attack Father     died 63  . Pneumonia Mother     died 49 - hip fx/pna  . Hypertension Sister     alive  . Cancer Brother     died - agent orange exposure  . Hypertension Brother     alive    Allergies  Allergen Reactions  . Codeine Phosphate     REACTION: unspecified  . Penicillins     REACTION: unspecified    Current Outpatient Prescriptions on File Prior to Visit  Medication Sig Dispense Refill  . HYDROcodone-homatropine (HYCODAN) 5-1.5 MG/5ML  syrup Take 5 mLs by mouth every 6 (six) hours as needed for cough.  180 mL  0  . meclizine (ANTIVERT) 25 MG tablet Take 1 tablet (25 mg total) by mouth 3 (three) times daily as needed.  30 tablet  0  . olmesartan (BENICAR) 20 MG tablet Take 1 tablet (20 mg total) by mouth daily.  90 tablet  6   No current facility-administered medications on file prior to visit.    BP 138/84  Pulse 88  Temp(Src) 98.2 F (36.8 C) (Oral)  Resp 20  Ht  (1.727 m)  Wt 263 lb (119.296 kg)  BMI 40.00 kg/m2  SpO2 97%    Review of Systems  Constitutional: Positive for activity change, appetite change and fatigue. Negative for fever and chills.  HENT: Positive for congestion and rhinorrhea. Negative for dental problem, ear pain, hearing loss, sore throat, tinnitus, trouble swallowing and voice change.   Eyes: Negative for pain, discharge and visual disturbance.  Respiratory: Positive for cough. Negative for chest tightness, wheezing and stridor.   Cardiovascular: Negative for chest pain, palpitations and leg swelling.  Gastrointestinal: Negative for nausea, vomiting, abdominal pain,  diarrhea, constipation, blood in stool and abdominal distention.  Genitourinary: Negative for urgency, hematuria, flank pain, discharge, difficulty urinating and genital sores.  Musculoskeletal: Negative for arthralgias, back pain, gait problem, joint swelling, myalgias and neck stiffness.  Skin: Negative for rash.  Neurological: Negative for dizziness, syncope, speech difficulty, weakness, numbness and headaches.  Hematological: Negative for adenopathy. Does not bruise/bleed easily.  Psychiatric/Behavioral: Negative for behavioral problems and dysphoric mood. The patient is not nervous/anxious.        Objective:   Physical Exam  Constitutional: He is oriented to person, place, and time. He appears well-developed.  HENT:  Head: Normocephalic.  Right Ear: External ear normal.  Left Ear: External ear normal.  Oropharynx  slightly injected  Eyes: Conjunctivae and EOM are normal.  Neck: Normal range of motion.  Cardiovascular: Normal rate and normal heart sounds.   Pulmonary/Chest: Breath sounds normal.  Abdominal: Bowel sounds are normal.  Musculoskeletal: Normal range of motion. He exhibits no edema and no tenderness.  Neurological: He is alert and oriented to person, place, and time.  Psychiatric: He has a normal mood and affect. His behavior is normal.          Assessment & Plan:   Viral URI with cough.  Will treat symptomatically Hypertension stable.  Benicar refilled

## 2014-01-04 ENCOUNTER — Ambulatory Visit (INDEPENDENT_AMBULATORY_CARE_PROVIDER_SITE_OTHER): Payer: BC Managed Care – PPO | Admitting: Internal Medicine

## 2014-01-04 ENCOUNTER — Encounter: Payer: Self-pay | Admitting: Internal Medicine

## 2014-01-04 VITALS — BP 140/80 | HR 79 | Temp 98.4°F | Resp 20 | Ht 68.0 in | Wt 264.0 lb

## 2014-01-04 DIAGNOSIS — M549 Dorsalgia, unspecified: Secondary | ICD-10-CM

## 2014-01-04 DIAGNOSIS — I10 Essential (primary) hypertension: Secondary | ICD-10-CM

## 2014-01-04 MED ORDER — CHLORTHALIDONE 25 MG PO TABS: 25.0000 mg | ORAL_TABLET | Freq: Every day | ORAL | Status: DC

## 2014-01-04 NOTE — Progress Notes (Signed)
Pre visit review using our clinic review tool, if applicable. No additional management support is needed unless otherwise documented below in the visit note. 

## 2014-01-04 NOTE — Patient Instructions (Signed)
Limit your sodium (Salt) intake    It is important that you exercise regularly, at least 20 minutes 3 to 4 times per week.  If you develop chest pain or shortness of breath seek  medical attention.  You need to lose weight.  Consider a lower calorie diet and regular exercise.  Please check your blood pressure on a regular basis.  If it is consistently greater than 150/90, please make an office appointment.  Back Injury Prevention Back injuries can be extremely painful and difficult to heal. After having one back injury, you are much more likely to experience another later on. It is important to learn how to avoid injuring or re-injuring your back. The following tips can help you to prevent a back injury. PHYSICAL FITNESS  Exercise regularly and try to develop good tone in your abdominal muscles. Your abdominal muscles provide a lot of the support needed by your back.  Do aerobic exercises (walking, jogging, biking, swimming) regularly.  Do exercises that increase balance and strength (tai chi, yoga) regularly. This can decrease your risk of falling and injuring your back.  Stretch before and after exercising.  Maintain a healthy weight. The more you weigh, the more stress is placed on your back. For every pound of weight, 10 times that amount of pressure is placed on the back. DIET  Talk to your caregiver about how much calcium and vitamin D you need per day. These nutrients help to prevent weakening of the bones (osteoporosis). Osteoporosis can cause broken (fractured) bones that lead to back pain.  Include good sources of calcium in your diet, such as dairy products, green, leafy vegetables, and products with calcium added (fortified).  Include good sources of vitamin D in your diet, such as milk and foods that are fortified with vitamin D.  Consider taking a nutritional supplement or a multivitamin if needed.  Stop smoking if you smoke. POSTURE  Sit and stand up straight. Avoid  leaning forward when you sit or hunching over when you stand.  Choose chairs with good low back (lumbar) support.  If you work at a desk, sit close to your work so you do not need to lean over. Keep your chin tucked in. Keep your neck drawn back and elbows bent at a right angle. Your arms should look like the letter "L."  Sit high and close to the steering wheel when you drive. Add a lumbar support to your car seat if needed.  Avoid sitting or standing in one position for too long. Take breaks to get up, stretch, and walk around at least once every hour. Take breaks if you are driving for long periods of time.  Sleep on your side with your knees slightly bent, or sleep on your back with a pillow under your knees. Do not sleep on your stomach. LIFTING, TWISTING, AND REACHING  Avoid heavy lifting, especially repetitive lifting. If you must do heavy lifting:  Stretch before lifting.  Work slowly.  Rest between lifts.  Use carts and dollies to move objects when possible.  Make several small trips instead of carrying 1 heavy load.  Ask for help when you need it.  Ask for help when moving big, awkward objects.  Follow these steps when lifting:  Stand with your feet shoulder-width apart.  Get as close to the object as you can. Do not try to pick up heavy objects that are far from your body.  Use handles or lifting straps if they are available.  Bend at your knees. Squat down, but keep your heels off the floor.  Keep your shoulders pulled back, your chin tucked in, and your back straight.  Lift the object slowly, tightening the muscles in your legs, abdomen, and buttocks. Keep the object as close to the center of your body as possible.  When you put a load down, use these same guidelines in reverse.  Do not:  Lift the object above your waist.  Twist at the waist while lifting or carrying a load. Move your feet if you need to turn, not your waist.  Bend over without bending  at your knees.  Avoid reaching over your head, across a table, or for an object on a high surface. OTHER TIPS  Avoid wet floors and keep sidewalks clear of ice to prevent falls.  Do not sleep on a mattress that is too soft or too hard.  Keep items that are used frequently within easy reach.  Put heavier objects on shelves at waist level and lighter objects on lower or higher shelves.  Find ways to decrease your stress, such as exercise, massage, or relaxation techniques. Stress can build up in your muscles. Tense muscles are more vulnerable to injury.  Seek treatment for depression or anxiety if needed. These conditions can increase your risk of developing back pain. SEEK MEDICAL CARE IF:  You injure your back.  You have questions about diet, exercise, or other ways to prevent back injuries. MAKE SURE YOU:  Understand these instructions.  Will watch your condition.  Will get help right away if you are not doing well or get worse. Document Released: 05/03/2004 Document Revised: 06/18/2011 Document Reviewed: 05/07/2011 [facility] Patient Information 2015 [place], [place]. This information is not intended to replace advice given to you by your health care provider. Make sure you discuss any questions you have with your health care provider. Lumbosacral Strain Lumbosacral strain is a strain of any of the parts that make up your lumbosacral vertebrae. Your lumbosacral vertebrae are the bones that make up the lower third of your backbone. Your lumbosacral vertebrae are held together by muscles and tough, fibrous tissue (ligaments).  CAUSES  A sudden blow to your back can cause lumbosacral strain. Also, anything that causes an excessive stretch of the muscles in the low back can cause this strain. This is typically seen when people exert themselves strenuously, fall, lift heavy objects, bend, or crouch repeatedly. RISK FACTORS  Physically demanding work.  Participation in pushing or  pulling sports or sports that require a sudden twist of the back (tennis, golf, baseball).  Weight lifting.  Excessive lower back curvature.  Forward-tilted pelvis.  Weak back or abdominal muscles or both.  Tight hamstrings. SIGNS AND SYMPTOMS  Lumbosacral strain may cause pain in the area of your injury or pain that moves (radiates) down your leg.  DIAGNOSIS Your health care provider can often diagnose lumbosacral strain through a physical exam. In some cases, you may need tests such as X-ray exams.  TREATMENT  Treatment for your lower back injury depends on many factors that your clinician will have to evaluate. However, most treatment will include the use of anti-inflammatory medicines. HOME CARE INSTRUCTIONS   Avoid hard physical activities (tennis, racquetball, waterskiing) if you are not in proper physical condition for it. This may aggravate or create problems.  If you have a back problem, avoid sports requiring sudden body movements. Swimming and walking are generally safer activities.  Maintain good posture.  Maintain a healthy  weight.  For acute conditions, you may put ice on the injured area.  Put ice in a plastic bag.  Place a towel between your skin and the bag.  Leave the ice on for 20 minutes, 2-3 times a day.  When the low back starts healing, stretching and strengthening exercises may be recommended. SEEK MEDICAL CARE IF:  Your back pain is getting worse.  You experience severe back pain not relieved with medicines. SEEK IMMEDIATE MEDICAL CARE IF:   You have numbness, tingling, weakness, or problems with the use of your arms or legs.  There is a change in bowel or bladder control.  You have increasing pain in any area of the body, including your belly (abdomen).  You notice shortness of breath, dizziness, or feel faint.  You feel sick to your stomach (nauseous), are throwing up (vomiting), or become sweaty.  You notice discoloration of your toes  or legs, or your feet get very cold. MAKE SURE YOU:   Understand these instructions.  Will watch your condition.  Will get help right away if you are not doing well or get worse. Document Released: 01/03/2005 Document Revised: 03/31/2013 Document Reviewed: 11/12/2012 [facility] Patient Information 2015 [place], [place]. This information is not intended to replace advice given to you by your health care provider. Make sure you discuss any questions you have with your health care provider.

## 2014-01-04 NOTE — Progress Notes (Signed)
Subjective:    Patient ID: David Vincent, male    DOB: [birth year], 63 y.o.   MRN: [id number]  HPI  Wt Readings from Last 3 Encounters:  01/04/14 264 lb (119.75 kg)  12/08/13 263 lb (119.296 kg)  04/04/13 255 lb (115.667 kg)   63 y/o  F/u HTN; c/o flair  Of LBP Needing to switch to cheaper BP med Presently persistent post viral cough  Past Medical History  Diagnosis Date  . BACK PAIN 03/10/2007  . HYPERTENSION 11/15/2006  . INGUINAL HERNIA, RIGHT 12/05/2009  . LEG CRAMPS 12/03/2008  . SLEEP APNEA 11/16/2008  . VERTIGO, POSITIONAL 08/08/2009  . ED (erectile dysfunction)   . History of mumps orchitis   . GERD (gastroesophageal reflux disease)   . CAD (coronary artery disease)     A.  01/05/2002 Cath - nonobs dzs (LAD 20prox, RI 30-40 prox)  . Unstable angina     History   Social History  . Marital Status: Married    Spouse Name: N/A    Number of Children: N/A  . Years of Education: N/A   Occupational History  . Not on file.   Social History Main Topics  . Smoking status: Never Smoker   . Smokeless tobacco: Never Used  . Alcohol Use: No  . Drug Use: No  . Sexual Activity: Not on file   Other Topics Concern  . Not on file   Social History Narrative   05/02/2011:  Currently unemployed.  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  Does not exercise or adhere to any specific diet.    Past Surgical History  Procedure Laterality Date  . Cardiac catheterization    . Knee arthroscopy      Right    Family History  Problem Relation Age of Onset  . Heart attack Father     died 25  . Pneumonia Mother     died 37 - hip fx/pna  . Hypertension Sister     alive  . Cancer Brother     died - agent orange exposure  . Hypertension Brother     alive    Allergies  Allergen Reactions  . Codeine Phosphate     REACTION: unspecified  . Penicillins     REACTION: unspecified    Current Outpatient Prescriptions on File Prior to Visit  Medication Sig  Dispense Refill  . EPINEPHrine 0.3 mg/0.3 mL IJ SOAJ injection Inject 0.3 mLs (0.3 mg total) into the muscle once.  1 Device  2  . meclizine (ANTIVERT) 25 MG tablet Take 1 tablet (25 mg total) by mouth 3 (three) times daily as needed.  30 tablet  0   No current facility-administered medications on file prior to visit.    BP 140/80  Pulse 79  Temp(Src) 98.4 F (36.9 C) (Oral)  Resp 20  Ht  (1.727 m)  Wt 264 lb (119.75 kg)  BMI 40.15 kg/m2  SpO2 95%     Review of Systems  Constitutional: Negative for fever, chills, appetite change and fatigue.  HENT: Negative for congestion, dental problem, ear pain, hearing loss, sore throat, tinnitus, trouble swallowing and voice change.   Eyes: Negative for pain, discharge and visual disturbance.  Respiratory: Positive for cough. Negative for chest tightness, wheezing and stridor.   Cardiovascular: Negative for chest pain, palpitations and leg swelling.  Gastrointestinal: Negative for nausea, vomiting, abdominal pain, diarrhea, constipation, blood in stool and abdominal distention.  Genitourinary: Negative  for urgency, hematuria, flank pain, discharge, difficulty urinating and genital sores.  Musculoskeletal: Positive for back pain. Negative for arthralgias, gait problem, joint swelling, myalgias and neck stiffness.  Skin: Negative for rash.  Neurological: Negative for dizziness, syncope, speech difficulty, weakness, numbness and headaches.  Hematological: Negative for adenopathy. Does not bruise/bleed easily.  Psychiatric/Behavioral: Positive for decreased concentration. Negative for behavioral problems and dysphoric mood. The patient is not nervous/anxious.        Objective:   Physical Exam  Constitutional: He is oriented to person, place, and time. He appears well-developed.  130/82  HENT:  Head: Normocephalic.  Right Ear: External ear normal.  Left Ear: External ear normal.  Eyes: Conjunctivae and EOM are normal.  Neck: Normal  range of motion.  Cardiovascular: Normal rate and normal heart sounds.   Pulmonary/Chest: Breath sounds normal.  Abdominal: Bowel sounds are normal.  Musculoskeletal: Normal range of motion. He exhibits no edema and no tenderness.  Neurological: He is alert and oriented to person, place, and time.  Psychiatric: He has a normal mood and affect. His behavior is normal.          Assessment & Plan:   HTN will swith to [place] Obesity LBP- continue symptomatic treatment and efforts at weight loss

## 2014-01-05 ENCOUNTER — Encounter: Payer: Self-pay | Admitting: Internal Medicine

## 2014-03-02 ENCOUNTER — Other Ambulatory Visit (INDEPENDENT_AMBULATORY_CARE_PROVIDER_SITE_OTHER): Payer: BC Managed Care – PPO

## 2014-03-02 DIAGNOSIS — Z0000 Encounter for general adult medical examination without abnormal findings: Secondary | ICD-10-CM

## 2014-03-02 LAB — CBC WITH DIFFERENTIAL/PLATELET
BASOS ABS: 0.1 10*3/uL (ref 0.0–0.1)
BASOS PCT: 1.1 % (ref 0.0–3.0)
Eosinophils Absolute: 0.6 10*3/uL (ref 0.0–0.7)
Eosinophils Relative: 7.7 % — ABNORMAL HIGH (ref 0.0–5.0)
HEMATOCRIT: 44.4 % (ref 39.0–52.0)
HEMOGLOBIN: 14.8 g/dL (ref 13.0–17.0)
LYMPHS ABS: 2.6 10*3/uL (ref 0.7–4.0)
Lymphocytes Relative: 33.9 % (ref 12.0–46.0)
MCHC: 33.4 g/dL (ref 30.0–36.0)
MCV: 94.3 fl (ref 78.0–100.0)
MONOS PCT: 7.5 % (ref 3.0–12.0)
Monocytes Absolute: 0.6 10*3/uL (ref 0.1–1.0)
NEUTROS ABS: 3.8 10*3/uL (ref 1.4–7.7)
Neutrophils Relative %: 49.8 % (ref 43.0–77.0)
Platelets: 272 10*3/uL (ref 150.0–400.0)
RBC: 4.71 Mil/uL (ref 4.22–5.81)
RDW: 14.2 % (ref 11.5–15.5)
WBC: 7.7 10*3/uL (ref 4.0–10.5)

## 2014-03-02 LAB — HEPATIC FUNCTION PANEL
ALK PHOS: 84 U/L (ref 39–117)
ALT: 20 U/L (ref 0–53)
AST: 20 U/L (ref 0–37)
Albumin: 4.3 g/dL (ref 3.5–5.2)
BILIRUBIN TOTAL: 0.7 mg/dL (ref 0.2–1.2)
Bilirubin, Direct: 0.1 mg/dL (ref 0.0–0.3)
Total Protein: 6.9 g/dL (ref 6.0–8.3)

## 2014-03-02 LAB — BASIC METABOLIC PANEL
BUN: 17 mg/dL (ref 6–23)
CALCIUM: 9.2 mg/dL (ref 8.4–10.5)
CHLORIDE: 103 meq/L (ref 96–112)
CO2: 28 meq/L (ref 19–32)
CREATININE: 0.9 mg/dL (ref 0.4–1.5)
GFR: 94.13 mL/min (ref >60.00)
GLUCOSE: 85 mg/dL (ref 70–99)
Potassium: 4.1 mEq/L (ref 3.5–5.1)
Sodium: 141 mEq/L (ref 135–145)

## 2014-03-02 LAB — TSH: TSH: 1.54 u[IU]/mL (ref 0.35–4.50)

## 2014-03-02 LAB — POCT URINALYSIS DIPSTICK
BILIRUBIN UA: NEGATIVE
Glucose, UA: NEGATIVE
KETONES UA: NEGATIVE
Leukocytes, UA: NEGATIVE
Nitrite, UA: NEGATIVE
PH UA: 5.5
PROTEIN UA: NEGATIVE
SPEC GRAV UA: 1.015
Urobilinogen, UA: 0.2

## 2014-03-02 LAB — LIPID PANEL
CHOL/HDL RATIO: 5
Cholesterol: 153 mg/dL (ref 0–200)
HDL: 32.6 mg/dL — ABNORMAL LOW (ref >39.00)
LDL Cholesterol: 92 mg/dL (ref 0–99)
NONHDL: 120.4
Triglycerides: 141 mg/dL (ref 0.0–149.0)
VLDL: 28.2 mg/dL (ref 0.0–40.0)

## 2014-03-02 LAB — PSA: PSA: 0.47 ng/mL (ref 0.10–4.00)

## 2014-03-09 ENCOUNTER — Encounter: Payer: Self-pay | Admitting: Internal Medicine

## 2014-03-09 ENCOUNTER — Ambulatory Visit (INDEPENDENT_AMBULATORY_CARE_PROVIDER_SITE_OTHER): Payer: BC Managed Care – PPO | Admitting: *Deleted

## 2014-03-09 ENCOUNTER — Ambulatory Visit (INDEPENDENT_AMBULATORY_CARE_PROVIDER_SITE_OTHER): Payer: BC Managed Care – PPO | Admitting: Internal Medicine

## 2014-03-09 VITALS — BP 140/90 | HR 75 | Temp 97.7°F | Resp 20 | Ht 69.5 in | Wt 264.0 lb

## 2014-03-09 DIAGNOSIS — I2510 Atherosclerotic heart disease of native coronary artery without angina pectoris: Secondary | ICD-10-CM

## 2014-03-09 DIAGNOSIS — I10 Essential (primary) hypertension: Secondary | ICD-10-CM

## 2014-03-09 DIAGNOSIS — Z23 Encounter for immunization: Secondary | ICD-10-CM

## 2014-03-09 DIAGNOSIS — I2583 Coronary atherosclerosis due to lipid rich plaque: Secondary | ICD-10-CM

## 2014-03-09 DIAGNOSIS — G4730 Sleep apnea, unspecified: Secondary | ICD-10-CM

## 2014-03-09 DIAGNOSIS — Z0000 Encounter for general adult medical examination without abnormal findings: Secondary | ICD-10-CM

## 2014-03-09 MED ORDER — BENAZEPRIL HCL 20 MG PO TABS: 20.0000 mg | ORAL_TABLET | Freq: Every day | ORAL | Status: DC

## 2014-03-09 MED ORDER — ATORVASTATIN CALCIUM 20 MG PO TABS: 20.0000 mg | ORAL_TABLET | Freq: Every day | ORAL | Status: DC

## 2014-03-09 NOTE — Patient Instructions (Signed)
Limit your sodium (Salt) intake    It is important that you exercise regularly, at least 20 minutes 3 to 4 times per week.  If you develop chest pain or shortness of breath seek  medical attention.  You need to lose weight.  Consider a lower calorie diet and regular exercise.  Return in 6 months for follow-up  Please check your blood pressure on a regular basis.  If it is consistently greater than 150/90, please make an office appointment. Fat and Cholesterol Control Diet Fat and cholesterol levels in your blood and organs are influenced by your diet. High levels of fat and cholesterol may lead to diseases of the heart, small and large blood vessels, gallbladder, liver, and pancreas. CONTROLLING FAT AND CHOLESTEROL WITH DIET Although exercise and lifestyle factors are important, your diet is key. That is because certain foods are known to raise cholesterol and others to lower it. The goal is to balance foods for their effect on cholesterol and more importantly, to replace saturated and trans fat with other types of fat, such as monounsaturated fat, polyunsaturated fat, and omega-3 fatty acids. On average, a person should consume no more than 15 to 17 g of saturated fat daily. Saturated and trans fats are considered "bad" fats, and they will raise LDL cholesterol. Saturated fats are primarily found in animal products such as meats, butter, and cream. However, that does not mean you need to give up all your favorite foods. Today, there are good tasting, low-fat, low-cholesterol substitutes for most of the things you like to eat. Choose low-fat or nonfat alternatives. Choose round or loin cuts of red meat. These types of cuts are lowest in fat and cholesterol. Chicken (without the skin), fish, veal, and ground [place] breast are great choices. Eliminate fatty meats, such as hot dogs and salami. Even shellfish have little or no saturated fat. Have a 3 oz (85 g) portion when you eat lean meat, poultry, or  fish. Trans fats are also called "partially hydrogenated oils." They are oils that have been scientifically manipulated so that they are solid at room temperature resulting in a longer shelf life and improved taste and texture of foods in which they are added. Trans fats are found in stick margarine, some tub margarines, cookies, crackers, and baked goods.  When baking and cooking, oils are a great substitute for butter. The monounsaturated oils are especially beneficial since it is believed they lower LDL and raise HDL. The oils you should avoid entirely are saturated tropical oils, such as coconut and palm.  Remember to eat a lot from food groups that are naturally free of saturated and trans fat, including fish, fruit, vegetables, beans, grains (barley, rice, couscous, bulgur wheat), and pasta (without cream sauces).  IDENTIFYING FOODS THAT LOWER FAT AND CHOLESTEROL  Soluble fiber may lower your cholesterol. This type of fiber is found in fruits such as apples, vegetables such as broccoli, potatoes, and carrots, legumes such as beans, peas, and lentils, and grains such as barley. Foods fortified with plant sterols (phytosterol) may also lower cholesterol. You should eat at least 2 g per day of these foods for a cholesterol lowering effect.  Read package labels to identify low-saturated fats, trans fat free, and low-fat foods at the supermarket. Select cheeses that have only 2 to 3 g saturated fat per ounce. Use a heart-healthy tub margarine that is free of trans fats or partially hydrogenated oil. When buying baked goods (cookies, crackers), avoid partially hydrogenated oils. Breads and  muffins should be made from whole grains (whole-wheat or whole oat flour, instead of "flour" or "enriched flour"). Buy non-creamy canned soups with reduced salt and no added fats.  FOOD PREPARATION TECHNIQUES  Never deep-fry. If you must fry, either stir-fry, which uses very little fat, or use non-stick cooking sprays.  When possible, broil, bake, or roast meats, and steam vegetables. Instead of putting butter or margarine on vegetables, use lemon and herbs, applesauce, and cinnamon (for squash and sweet potatoes). Use nonfat yogurt, salsa, and low-fat dressings for salads.  LOW-SATURATED FAT / LOW-FAT FOOD SUBSTITUTES Meats / Saturated Fat (g)  Avoid: Steak, marbled (3 oz/85 g) / 11 g  Choose: Steak, lean (3 oz/85 g) / 4 g  Avoid: Hamburger (3 oz/85 g) / 7 g  Choose: Hamburger, lean (3 oz/85 g) / 5 g  Avoid: Ham (3 oz/85 g) / 6 g  Choose: Ham, lean cut (3 oz/85 g) / 2.4 g  Avoid: Chicken, with skin, dark meat (3 oz/85 g) / 4 g  Choose: Chicken, skin removed, dark meat (3 oz/85 g) / 2 g  Avoid: Chicken, with skin, light meat (3 oz/85 g) / 2.5 g  Choose: Chicken, skin removed, light meat (3 oz/85 g) / 1 g Dairy / Saturated Fat (g)  Avoid: Whole milk (1 cup) / 5 g  Choose: Low-fat milk, 2% (1 cup) / 3 g  Choose: Low-fat milk, 1% (1 cup) / 1.5 g  Choose: Skim milk (1 cup) / 0.3 g  Avoid: Hard cheese (1 oz/28 g) / 6 g  Choose: Skim milk cheese (1 oz/28 g) / 2 to 3 g  Avoid: Cottage cheese, 4% fat (1 cup) / 6.5 g  Choose: Low-fat cottage cheese, 1% fat (1 cup) / 1.5 g  Avoid: Ice cream (1 cup) / 9 g  Choose: Sherbet (1 cup) / 2.5 g  Choose: Nonfat frozen yogurt (1 cup) / 0.3 g  Choose: Frozen fruit bar / trace  Avoid: Whipped cream (1 tbs) / 3.5 g  Choose: Nondairy whipped topping (1 tbs) / 1 g Condiments / Saturated Fat (g)  Avoid: Mayonnaise (1 tbs) / 2 g  Choose: Low-fat mayonnaise (1 tbs) / 1 g  Avoid: Butter (1 tbs) / 7 g  Choose: Extra light margarine (1 tbs) / 1 g  Avoid: Coconut oil (1 tbs) / 11.8 g  Choose: Olive oil (1 tbs) / 1.8 g  Choose: Corn oil (1 tbs) / 1.7 g  Choose: Safflower oil (1 tbs) / 1.2 g  Choose: Sunflower oil (1 tbs) / 1.4 g  Choose: Soybean oil (1 tbs) / 2.4 g  Choose: Canola oil (1 tbs) / 1 g Document Released: 03/26/2005 Document  Revised: 07/21/2012 Document Reviewed: 06/24/2013 [employer] Patient Information 2015 [place], [place]. This information is not intended to replace advice given to you by your health care provider. Make sure you discuss any questions you have with your health care provider. Health Maintenance A healthy lifestyle and preventative care can promote health and wellness.  Maintain regular health, dental, and eye exams.  Eat a healthy diet. Foods like vegetables, fruits, whole grains, low-fat dairy products, and lean protein foods contain the nutrients you need and are low in calories. Decrease your intake of foods high in solid fats, added sugars, and salt. Get information about a proper diet from your health care provider, if necessary.  Regular physical exercise is one of the most important things you can do for your health. Most adults should  get at least 150 minutes of moderate-intensity exercise (any activity that increases your heart rate and causes you to sweat) each week. In addition, most adults need muscle-strengthening exercises on 2 or more days a week.   Maintain a healthy weight. The body mass index (BMI) is a screening tool to identify possible weight problems. It provides an estimate of body fat based on height and weight. Your health care provider can find your BMI and can help you achieve or maintain a healthy weight. For males 20 years and older:  A BMI below 18.5 is considered underweight.  A BMI of 18.5 to 24.9 is normal.  A BMI of 25 to 29.9 is considered overweight.  A BMI of 30 and above is considered obese.  Maintain normal blood lipids and cholesterol by exercising and minimizing your intake of saturated fat. Eat a balanced diet with plenty of fruits and vegetables. Blood tests for lipids and cholesterol should begin at age 70 and be repeated every 5 years. If your lipid or cholesterol levels are high, you are over age 10, or you are at high risk for heart disease, you may  need your cholesterol levels checked more frequently.Ongoing high lipid and cholesterol levels should be treated with medicines if diet and exercise are not working.  If you smoke, find out from your health care provider how to quit. If you do not use tobacco, do not start.  Lung cancer screening is recommended for adults aged 35-80 years who are at high risk for developing lung cancer because of a history of smoking. A yearly low-dose CT scan of the lungs is recommended for people who have at least a 30-pack-year history of smoking and are current smokers or have quit within the past 15 years. A pack year of smoking is smoking an average of 1 pack of cigarettes a day for 1 year (for example, a 30-pack-year history of smoking could mean smoking 1 pack a day for 30 years or 2 packs a day for 15 years). Yearly screening should continue until the smoker has stopped smoking for at least 15 years. Yearly screening should be stopped for people who develop a health problem that would prevent them from having lung cancer treatment.  If you choose to drink alcohol, do not have more than 2 drinks per day. One drink is considered to be 12 oz (360 mL) of beer, 5 oz (150 mL) of wine, or 1.5 oz (45 mL) of liquor.  Avoid the use of street drugs. Do not share needles with anyone. Ask for help if you need support or instructions about stopping the use of drugs.  High blood pressure causes heart disease and increases the risk of stroke. Blood pressure should be checked at least every 1-2 years. Ongoing high blood pressure should be treated with medicines if weight loss and exercise are not effective.  If you are 4-43 years old, ask your health care provider if you should take aspirin to prevent heart disease.  Diabetes screening involves taking a blood sample to check your fasting blood sugar level. This should be done once every 3 years after age 20 if you are at a normal weight and without risk factors for diabetes.  Testing should be considered at a younger age or be carried out more frequently if you are overweight and have at least 1 risk factor for diabetes.  Colorectal cancer can be detected and often prevented. Most routine colorectal cancer screening begins at the age of 73  and continues through age 35. However, your health care provider may recommend screening at an earlier age if you have risk factors for colon cancer. On a yearly basis, your health care provider may provide home test kits to check for hidden blood in the stool. A small camera at the end of a tube may be used to directly examine the colon (sigmoidoscopy or colonoscopy) to detect the earliest forms of colorectal cancer. Talk to your health care provider about this at age 75 when routine screening begins. A direct exam of the colon should be repeated every 5-10 years through age 90, unless early forms of precancerous polyps or small growths are found.  People who are at an increased risk for hepatitis B should be screened for this virus. You are considered at high risk for hepatitis B if:  You were born in a country where hepatitis B occurs often. Talk with your health care provider about which countries are considered high risk.  Your parents were born in a high-risk country and you have not received a shot to protect against hepatitis B (hepatitis B vaccine).  You have HIV or AIDS.  You use needles to inject street drugs.  You live with, or have sex with, someone who has hepatitis B.  You are a man who has sex with other men (MSM).  You get hemodialysis treatment.  You take certain medicines for conditions like cancer, organ transplantation, and autoimmune conditions.  Hepatitis C blood testing is recommended for all people born from [birth year] through 1965 and any individual with known risk factors for hepatitis C.  Healthy men should no longer receive prostate-specific antigen (PSA) blood tests as part of routine cancer screening.  Talk to your health care provider about prostate cancer screening.  Testicular cancer screening is not recommended for adolescents or adult males who have no symptoms. Screening includes self-exam, a health care provider exam, and other screening tests. Consult with your health care provider about any symptoms you have or any concerns you have about testicular cancer.  Practice safe sex. Use condoms and avoid high-risk sexual practices to reduce the spread of sexually transmitted infections (STIs).  You should be screened for STIs, including gonorrhea and chlamydia if:  You are sexually active and are younger than 24 years.  You are older than 24 years, and your health care provider tells you that you are at risk for this type of infection.  Your sexual activity has changed since you were last screened, and you are at an increased risk for chlamydia or gonorrhea. Ask your health care provider if you are at risk.  If you are at risk of being infected with HIV, it is recommended that you take a prescription medicine daily to prevent HIV infection. This is called pre-exposure prophylaxis (PrEP). You are considered at risk if:  You are a man who has sex with other men (MSM).  You are a heterosexual man who is sexually active with multiple partners.  You take drugs by injection.  You are sexually active with a partner who has HIV.  Talk with your health care provider about whether you are at high risk of being infected with HIV. If you choose to begin PrEP, you should first be tested for HIV. You should then be tested every 3 months for as long as you are taking PrEP.  Use sunscreen. Apply sunscreen liberally and repeatedly throughout the day. You should seek shade when your shadow is shorter than you. Protect yourself  by wearing long sleeves, pants, a wide-brimmed hat, and sunglasses year round whenever you are outdoors.  Tell your health care provider of new moles or changes in moles,  especially if there is a change in shape or color. Also, tell your health care provider if a mole is larger than the size of a pencil eraser.  A one-time screening for abdominal aortic aneurysm (AAA) and surgical repair of large AAAs by ultrasound is recommended for men aged 18-75 years who are current or former smokers.  Stay current with your vaccines (immunizations). Document Released: 09/22/2007 Document Revised: 03/31/2013 Document Reviewed: 08/21/2010 [facility] Patient Information 2015 [place], [place]. This information is not intended to replace advice given to you by your health care provider. Make sure you discuss any questions you have with your health care provider.

## 2014-03-09 NOTE — Progress Notes (Signed)
Subjective:    Patient ID: David Vincent, male    DOB: 08/05/1950, 63 y.o.   MRN: [id number]  HPI 63 year old patient who is seen today for a preventive health examination  Doing quite well.  No concerns or complaints.Medical problems include a history of obesity and treated hypertension.  Colonoscopy 2011  He has a history of nonobstructive CAD noted on heart catheterization in 2003.  He was admitted in 2013 with chest pain thought GI in origin.  A stress Myoview was normal   Past Medical History  Diagnosis Date  . BACK PAIN 03/10/2007  . HYPERTENSION 11/15/2006  . INGUINAL HERNIA, RIGHT 12/05/2009  . LEG CRAMPS 12/03/2008  . SLEEP APNEA 11/16/2008  . VERTIGO, POSITIONAL 08/08/2009  . ED (erectile dysfunction)   . History of mumps orchitis   . GERD (gastroesophageal reflux disease)   . CAD (coronary artery disease)     A.  01/05/2002 Cath - nonobs dzs (LAD 20prox, RI 30-40 prox)  . Unstable angina     History   Social History  . Marital Status: Married    Spouse Name: N/A    Number of Children: N/A  . Years of Education: N/A   Occupational History  . Not on file.   Social History Main Topics  . Smoking status: Never Smoker   . Smokeless tobacco: Never Used  . Alcohol Use: No  . Drug Use: No  . Sexual Activity: Not on file   Other Topics Concern  . Not on file   Social History Narrative   05/02/2011:  Currently unemployed.  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  Does not exercise or adhere to any specific diet.    Past Surgical History  Procedure Laterality Date  . Cardiac catheterization    . Knee arthroscopy      Right    Family History  Problem Relation Age of Onset  . Heart attack Father     died 9590  . Pneumonia Mother     died 6588 - hip fx/pna  . Hypertension Sister     alive  . Cancer Brother     died - agent orange exposure  . Hypertension Brother     alive    Allergies  Allergen Reactions  . Codeine Phosphate    REACTION: unspecified  . Penicillins     REACTION: unspecified    Current Outpatient Prescriptions on File Prior to Visit  Medication Sig Dispense Refill  . chlorthalidone (HYGROTON) 25 MG tablet Take 1 tablet (25 mg total) by mouth daily. 90 tablet 4  . EPINEPHrine 0.3 mg/0.3 mL IJ SOAJ injection Inject 0.3 mLs (0.3 mg total) into the muscle once. 1 Device 2  . meclizine (ANTIVERT) 25 MG tablet Take 1 tablet (25 mg total) by mouth 3 (three) times daily as needed. 30 tablet 0   No current facility-administered medications on file prior to visit.    BP 140/90 mmHg  Pulse 75  Temp(Src) 97.7 F (36.5 C) (Oral)  Resp 20  Ht 5' 9.5" (1.765 m)  Wt 264 lb (119.75 kg)  BMI 38.44 kg/m2  SpO2 98%       Review of Systems  Constitutional: Negative for fever, chills, activity change, appetite change and fatigue.  HENT: Negative for congestion, dental problem, ear pain, hearing loss, mouth sores, rhinorrhea, sinus pressure, sneezing, tinnitus, trouble swallowing and voice change.   Eyes: Negative for photophobia, pain, redness and visual disturbance.  Respiratory:  Negative for apnea, cough, choking, chest tightness, shortness of breath and wheezing.   Cardiovascular: Negative for chest pain, palpitations and leg swelling.  Gastrointestinal: Negative for nausea, vomiting, abdominal pain, diarrhea, constipation, blood in stool, abdominal distention, anal bleeding and rectal pain.  Genitourinary: Positive for frequency. Negative for dysuria, urgency, hematuria, flank pain, decreased urine volume, discharge, penile swelling, scrotal swelling, difficulty urinating, genital sores and testicular pain.  Musculoskeletal: Negative for myalgias, back pain, joint swelling, arthralgias, gait problem, neck pain and neck stiffness.  Skin: Negative for color change, rash and wound.  Neurological: Negative for dizziness, tremors, seizures, syncope, facial asymmetry, speech difficulty, weakness,  light-headedness, numbness and headaches.  Hematological: Negative for adenopathy. Does not bruise/bleed easily.  Psychiatric/Behavioral: Negative for suicidal ideas, hallucinations, behavioral problems, confusion, sleep disturbance, self-injury, dysphoric mood, decreased concentration and agitation. The patient is not nervous/anxious.        Objective:   Physical Exam  Constitutional: He appears well-developed and well-nourished.  HENT:  Head: Normocephalic and atraumatic.  Right Ear: External ear normal.  Left Ear: External ear normal.  Nose: Nose normal.  Mouth/Throat: Oropharynx is clear and moist.  Eyes: Conjunctivae and EOM are normal. Pupils are equal, round, and reactive to light. No scleral icterus.  Neck: Normal range of motion. Neck supple. No JVD present. No thyromegaly present.  Cardiovascular: Regular rhythm, normal heart sounds and intact distal pulses.  Exam reveals no gallop and no friction rub.   No murmur heard. Pulmonary/Chest: Effort normal and breath sounds normal. He exhibits no tenderness.  Abdominal: Soft. Bowel sounds are normal. He exhibits no distension and no mass. There is no tenderness.  Genitourinary: Prostate normal and penis normal. Guaiac negative stool.  Musculoskeletal: Normal range of motion. He exhibits no edema or tenderness.  Lymphadenopathy:    He has no cervical adenopathy.  Neurological: He is alert. He has normal reflexes. No cranial nerve deficit. Coordination normal.  Skin: Skin is warm and dry. No rash noted.  Psychiatric: He has a normal mood and affect. His behavior is normal.          Assessment & Plan:   Preventive health care Hypertension, controlled Mild BPH History of nonobstructive coronary artery disease.  Will start on low intensity statin therapy Obesity.  Weight loss encouraged Back pain, stable  Recheck 6 months

## 2014-03-09 NOTE — Progress Notes (Signed)
Pre visit review using our clinic review tool, if applicable. No additional management support is needed unless otherwise documented below in the visit note. 

## 2014-03-10 ENCOUNTER — Telehealth: Payer: Self-pay | Admitting: Internal Medicine

## 2014-03-10 NOTE — Telephone Encounter (Signed)
emmi mailed  °

## 2014-03-26 ENCOUNTER — Ambulatory Visit (INDEPENDENT_AMBULATORY_CARE_PROVIDER_SITE_OTHER): Payer: BC Managed Care – PPO | Admitting: Family Medicine

## 2014-03-26 ENCOUNTER — Encounter: Payer: Self-pay | Admitting: Family Medicine

## 2014-03-26 VITALS — BP 136/78 | HR 76 | Temp 97.5°F | Ht 69.5 in | Wt 262.7 lb

## 2014-03-26 DIAGNOSIS — J069 Acute upper respiratory infection, unspecified: Secondary | ICD-10-CM

## 2014-03-26 MED ORDER — HYDROCODONE-HOMATROPINE 5-1.5 MG/5ML PO SYRP: 5.0000 mL | ORAL_SOLUTION | Freq: Three times a day (TID) | ORAL | Status: DC | PRN

## 2014-03-26 NOTE — Patient Instructions (Signed)
INSTRUCTIONS FOR UPPER RESPIRATORY INFECTION:  -plenty of rest and fluids  -nasal saline wash 2-3 times daily (use prepackaged nasal saline or bottled/distilled water if making your own)   -can use AFRIN nasal spray for drainage and nasal congestion - but do NOT use longer then 3-4 days  -As we discussed, we have prescribed a new medication (HYCODAN) for you at this appointment. We discussed the common and serious potential adverse effects of this medication and you can review these and more with the pharmacist when you pick up your medication.  Please follow the instructions for use carefully and notify us immediately if you have any problems taking this medication.  -can use tylenol or ibuprofen as directed for aches and sorethroat  -in the winter time, using a humidifier at night is helpful (please follow cleaning instructions)  -if you are taking a cough medication - use only as directed, may also try a teaspoon of honey to coat the throat and throat lozenges  -for sore throat, salt water gargles can help  -follow up if you have fevers, facial pain, tooth pain, difficulty breathing or are worsening or not getting better in 5-7 days

## 2014-03-26 NOTE — Progress Notes (Signed)
Pre visit review using our clinic review tool, if applicable. No additional management support is needed unless otherwise documented below in the visit note. 

## 2014-03-26 NOTE — Progress Notes (Signed)
HPI:  Sinus Issues: -started: 4-5 days ago -symptoms:nasal congestion, sore throat, cough, ears throb -denies:fever, SOB, NVD, tooth pain, body aches -has tried: musinex, hycodan -sick contacts/travel/risks: denies flu exposure, tick exposure or or Ebola risks, son with cold -reports hycodan is a must and the only thing that helps when he has a cold, reports is NOT allergic to this  ROS: See pertinent positives and negatives per HPI.  Past Medical History  Diagnosis Date  . BACK PAIN 03/10/2007  . HYPERTENSION 11/15/2006  . INGUINAL HERNIA, RIGHT 12/05/2009  . LEG CRAMPS 12/03/2008  . SLEEP APNEA 11/16/2008  . VERTIGO, POSITIONAL 08/08/2009  . ED (erectile dysfunction)   . History of mumps orchitis   . GERD (gastroesophageal reflux disease)   . CAD (coronary artery disease)     A.  01/05/2002 Cath - nonobs dzs (LAD 20prox, RI 30-40 prox)  . Unstable angina     Past Surgical History  Procedure Laterality Date  . Cardiac catheterization    . Knee arthroscopy      Right    Family History  Problem Relation Age of Onset  . Heart attack Father     died 5890  . Pneumonia Mother     died 6188 - hip fx/pna  . Hypertension Sister     alive  . Cancer Brother     died - agent orange exposure  . Hypertension Brother     alive    History   Social History  . Marital Status: Married    Spouse Name: N/A    Number of Children: N/A  . Years of Education: N/A   Social History Main Topics  . Smoking status: Never Smoker   . Smokeless tobacco: Never Used  . Alcohol Use: No  . Drug Use: No  . Sexual Activity: None   Other Topics Concern  . None   Social History Narrative   05/02/2011:  Currently unemployed.  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  Does not exercise or adhere to any specific diet.    Current outpatient prescriptions: atorvastatin (LIPITOR) 20 MG tablet, Take 1 tablet (20 mg total) by mouth daily., Disp: 90 tablet, Rfl: 3;  benazepril (LOTENSIN)  20 MG tablet, Take 1 tablet (20 mg total) by mouth daily., Disp: 90 tablet, Rfl: 3;  EPINEPHrine 0.3 mg/0.3 mL IJ SOAJ injection, Inject 0.3 mLs (0.3 mg total) into the muscle once., Disp: 1 Device, Rfl: 2 meclizine (ANTIVERT) 25 MG tablet, Take 1 tablet (25 mg total) by mouth 3 (three) times daily as needed., Disp: 30 tablet, Rfl: 0;  HYDROcodone-homatropine (HYCODAN) 5-1.5 MG/5ML syrup, Take 5 mLs by mouth every 8 (eight) hours as needed for cough., Disp: 120 mL, Rfl: 0  EXAM:  Filed Vitals:   03/26/14 1359  BP: 136/78  Pulse: 76  Temp: 97.5 F (36.4 C)    Body mass index is 38.25 kg/(m^2).  GENERAL: vitals reviewed and listed above, alert, oriented, appears well hydrated and in no acute distress  HEENT: atraumatic, conjunttiva clear, no obvious abnormalities on inspection of external nose and ears, normal appearance of ear canals and TMs, clear nasal congestion, mild post oropharyngeal erythema with PND, no 1+ edema, no exudate, no sinus TTP  NECK: no obvious masses on inspection  LUNGS: clear to auscultation bilaterally, no wheezes, rales or rhonchi, good air movement  CV: HRRR, no peripheral edema  MS: moves all extremities without noticeable abnormality  PSYCH: pleasant and cooperative,  no obvious depression or anxiety  ASSESSMENT AND PLAN:  Discussed the following assessment and plan:  Acute upper respiratory infection - Plan: HYDROcodone-homatropine (HYCODAN) 5-1.5 MG/5ML syrup  -given HPI and exam findings today, a serious infection or illness is unlikely. We discussed potential etiologies, with VURI being most likely, and advised supportive care and monitoring. We discussed treatment side effects, likely course, antibiotic misuse, transmission, and signs of developing a serious illness. -of course, we advised to return or notify a doctor immediately if symptoms worsen or persist or new concerns arise.    Patient Instructions  INSTRUCTIONS FOR UPPER RESPIRATORY  INFECTION:  -plenty of rest and fluids  -nasal saline wash 2-3 times daily (use prepackaged nasal saline or bottled/distilled water if making your own)   -can use AFRIN nasal spray for drainage and nasal congestion - but do NOT use longer then 3-4 days  -As we discussed, we have prescribed a new medication (HYCODAN) for you at this appointment. We discussed the common and serious potential adverse effects of this medication and you can review these and more with the pharmacist when you pick up your medication.  Please follow the instructions for use carefully and notify us immediately if you have any problems taking this medication.  -can use tylenol or ibuprofen as directed for aches and sorethroat  -in the winter time, using a humidifier at night is helpful (please follow cleaning instructions)  -if you are taking a cough medication - use only as directed, may also try a teaspoon of honey to coat the throat and throat lozenges  -for sore throat, salt water gargles can help  -follow up if you have fevers, facial pain, tooth pain, difficulty breathing or are worsening or not getting better in 5-7 days      David Marling R.

## 2014-03-30 ENCOUNTER — Telehealth: Payer: Self-pay | Admitting: Internal Medicine

## 2014-03-30 DIAGNOSIS — J069 Acute upper respiratory infection, unspecified: Secondary | ICD-10-CM

## 2014-03-30 MED ORDER — HYDROCODONE-HOMATROPINE 5-1.5 MG/5ML PO SYRP: 5.0000 mL | ORAL_SOLUTION | Freq: Three times a day (TID) | ORAL | Status: DC | PRN

## 2014-03-30 MED ORDER — DICLOFENAC SODIUM 75 MG PO TBEC: 75.0000 mg | DELAYED_RELEASE_TABLET | Freq: Two times a day (BID) | ORAL | Status: DC

## 2014-03-30 NOTE — Telephone Encounter (Signed)
Patient saw Dr Selena BattenKim last week for cough/congestion. He is not feeling any better and would like to know if you can call in something additional for him or if he needs to make an appt to see you.

## 2014-03-30 NOTE — Telephone Encounter (Signed)
Spoke to pt, told him Dr, Kirtland BouchardK will refill cough syrup and give him an anti-inflammatory but he does not need antibiotic. Pt verbalized understanding. Told pt will print Rx's and he can come by and pickup. Pt verbalized understanding. Rx's printed and signed by Dr. KKirtland Bouchard

## 2014-03-30 NOTE — Telephone Encounter (Signed)
Okay to refill generic Hycodan if needed Generic Voltaren 75 #14 one twice a day

## 2014-03-30 NOTE — Telephone Encounter (Signed)
Please see message and advise 

## 2014-04-20 ENCOUNTER — Ambulatory Visit (INDEPENDENT_AMBULATORY_CARE_PROVIDER_SITE_OTHER): Payer: 59 | Admitting: Internal Medicine

## 2014-04-20 ENCOUNTER — Encounter: Payer: Self-pay | Admitting: Internal Medicine

## 2014-04-20 VITALS — BP 132/78 | HR 83 | Temp 97.6°F | Resp 20 | Ht 69.5 in | Wt 262.0 lb

## 2014-04-20 DIAGNOSIS — I10 Essential (primary) hypertension: Secondary | ICD-10-CM

## 2014-04-20 DIAGNOSIS — B9789 Other viral agents as the cause of diseases classified elsewhere: Secondary | ICD-10-CM

## 2014-04-20 DIAGNOSIS — J069 Acute upper respiratory infection, unspecified: Secondary | ICD-10-CM

## 2014-04-20 MED ORDER — HYDROCODONE-HOMATROPINE 5-1.5 MG/5ML PO SYRP: 5.0000 mL | ORAL_SOLUTION | Freq: Three times a day (TID) | ORAL | Status: DC | PRN

## 2014-04-20 NOTE — Patient Instructions (Signed)
Acute bronchitis symptoms are generally not helped by antibiotics.  Take over-the-counter expectorants and cough medications such as  Mucinex DM.  Call if there is no improvement in 5 to 7 days or if  you develop worsening cough, fever, or new symptoms, such as shortness of breath or chest pain.    Use a home humidifier    Acute Bronchitis Bronchitis is when the airways that extend from the windpipe into the lungs get red, puffy, and painful (inflamed). Bronchitis often causes thick spit (mucus) to develop. This leads to a cough. A cough is the most common symptom of bronchitis. In acute bronchitis, the condition usually begins suddenly and goes away over time (usually in 2 weeks). Smoking, allergies, and asthma can make bronchitis worse. Repeated episodes of bronchitis may cause more lung problems. HOME CARE  Rest.  Drink enough fluids to keep your pee (urine) clear or pale yellow (unless you need to limit fluids as told by your doctor).  Only take over-the-counter or prescription medicines as told by your doctor.  Avoid smoking and secondhand smoke. These can make bronchitis worse. If you are a smoker, think about using nicotine gum or skin patches. Quitting smoking will help your lungs heal faster.  Reduce the chance of getting bronchitis again by:  Washing your hands often.  Avoiding people with cold symptoms.  Trying not to touch your hands to your mouth, nose, or eyes.  Follow up with your doctor as told. GET HELP IF: Your symptoms do not improve after 1 week of treatment. Symptoms include:  Cough.  Fever.  Coughing up thick spit.  Body aches.  Chest congestion.  Chills.  Shortness of breath.  Sore throat. GET HELP RIGHT AWAY IF:   You have an increased fever.  You have chills.  You have severe shortness of breath.  You have bloody thick spit (sputum).  You throw up (vomit) often.  You lose too much body fluid (dehydration).  You have a severe  headache.  You faint. MAKE SURE YOU:   Understand these instructions.  Will watch your condition.  Will get help right away if you are not doing well or get worse. Document Released: 09/12/2007 Document Revised: 11/26/2012 Document Reviewed: 09/16/2012 [facility] Patient Information 2015 [employer] [place]. This information is not intended to replace advice given to you by your health care provider. Make sure you discuss any questions you have with your health care provider.

## 2014-04-20 NOTE — Progress Notes (Signed)
Subjective:    Patient ID: David Vincent, male    DOB: 04/06/1951, 64 y.o.   MRN: [id number]  HPI 64 year old patient who is seen today in follow-up.  He was seen last month and treated symptomatically for a URI.  This included Hycodan, which she has tolerated well in spite of a history of allergy to codeine.  His chief complaint is cough that is bothersome at night but not throughout the day.  No fever, chills or sputum production.  He does have a history of sleep apnea and essential hypertension.  Past Medical History  Diagnosis Date  . BACK PAIN 03/10/2007  . HYPERTENSION 11/15/2006  . INGUINAL HERNIA, RIGHT 12/05/2009  . LEG CRAMPS 12/03/2008  . SLEEP APNEA 11/16/2008  . VERTIGO, POSITIONAL 08/08/2009  . ED (erectile dysfunction)   . History of mumps orchitis   . GERD (gastroesophageal reflux disease)   . CAD (coronary artery disease)     A.  01/05/2002 Cath - nonobs dzs (LAD 20prox, RI 30-40 prox)  . Unstable angina     History   Social History  . Marital Status: Married    Spouse Name: N/A    Number of Children: N/A  . Years of Education: N/A   Occupational History  . Not on file.   Social History Main Topics  . Smoking status: Never Smoker   . Smokeless tobacco: Never Used  . Alcohol Use: No  . Drug Use: No  . Sexual Activity: Not on file   Other Topics Concern  . Not on file   Social History Narrative   05/02/2011:  Currently unemployed.  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  Does not exercise or adhere to any specific diet.    Past Surgical History  Procedure Laterality Date  . Cardiac catheterization    . Knee arthroscopy      Right    Family History  Problem Relation Age of Onset  . Heart attack Father     died 4690  . Pneumonia Mother     died 188 - hip fx/pna  . Hypertension Sister     alive  . Cancer Brother     died - agent orange exposure  . Hypertension Brother     alive    Allergies  Allergen Reactions  . Codeine  Phosphate     REACTION: unspecified  . Penicillins     REACTION: unspecified    Current Outpatient Prescriptions on File Prior to Visit  Medication Sig Dispense Refill  . atorvastatin (LIPITOR) 20 MG tablet Take 1 tablet (20 mg total) by mouth daily. 90 tablet 3  . benazepril (LOTENSIN) 20 MG tablet Take 1 tablet (20 mg total) by mouth daily. 90 tablet 3  . diclofenac (VOLTAREN) 75 MG EC tablet Take 1 tablet (75 mg total) by mouth 2 (two) times daily. 60 tablet 0  . EPINEPHrine 0.3 mg/0.3 mL IJ SOAJ injection Inject 0.3 mLs (0.3 mg total) into the muscle once. 1 Device 2  . HYDROcodone-homatropine (HYCODAN) 5-1.5 MG/5ML syrup Take 5 mLs by mouth every 8 (eight) hours as needed for cough. 120 mL 0  . meclizine (ANTIVERT) 25 MG tablet Take 1 tablet (25 mg total) by mouth 3 (three) times daily as needed. 30 tablet 0   No current facility-administered medications on file prior to visit.    BP 132/78 mmHg  Pulse 83  Temp(Src) 97.6 F (36.4 C) (Oral)  Resp 20  Ht  5' 9.5" (1.765 m)  Wt 262 lb (118.842 kg)  BMI 38.15 kg/m2  SpO2 99%      Review of Systems  Constitutional: Positive for activity change, appetite change and fatigue. Negative for fever and chills.  HENT: Negative for congestion, dental problem, ear pain, hearing loss, sore throat, tinnitus, trouble swallowing and voice change.   Eyes: Negative for pain, discharge and visual disturbance.  Respiratory: Positive for cough. Negative for chest tightness, wheezing and stridor.   Cardiovascular: Negative for chest pain, palpitations and leg swelling.  Gastrointestinal: Negative for nausea, vomiting, abdominal pain, diarrhea, constipation, blood in stool and abdominal distention.  Genitourinary: Negative for urgency, hematuria, flank pain, discharge, difficulty urinating and genital sores.  Musculoskeletal: Negative for myalgias, back pain, joint swelling, arthralgias, gait problem and neck stiffness.  Skin: Negative for rash.    Neurological: Negative for dizziness, syncope, speech difficulty, weakness, numbness and headaches.  Hematological: Negative for adenopathy. Does not bruise/bleed easily.  Psychiatric/Behavioral: Negative for behavioral problems and dysphoric mood. The patient is not nervous/anxious.        Objective:   Physical Exam  Constitutional: He is oriented to person, place, and time. He appears well-developed and well-nourished. No distress.  HENT:  Head: Normocephalic.  Right Ear: External ear normal.  Left Ear: External ear normal.  Eyes: Conjunctivae and EOM are normal.  Neck: Normal range of motion.  Cardiovascular: Normal rate, regular rhythm and normal heart sounds.   Pulmonary/Chest: Effort normal and breath sounds normal. No respiratory distress. He has no wheezes. He has no rales.  O2 saturation 99  Abdominal: Bowel sounds are normal.  Musculoskeletal: Normal range of motion. He exhibits no edema or tenderness.  Neurological: He is alert and oriented to person, place, and time.  Psychiatric: He has a normal mood and affect. His behavior is normal.          Assessment & Plan:   Viral tracheobronchitis with cough, mainly nocturnal.  Will continue symptomatic treatment.  We'll add Mucinex DM Hypertension, stable

## 2014-04-20 NOTE — Progress Notes (Signed)
Pre visit review using our clinic review tool, if applicable. No additional management support is needed unless otherwise documented below in the visit note. 

## 2014-06-21 ENCOUNTER — Encounter: Payer: Self-pay | Admitting: Internal Medicine

## 2014-06-21 ENCOUNTER — Ambulatory Visit (INDEPENDENT_AMBULATORY_CARE_PROVIDER_SITE_OTHER): Payer: 59 | Admitting: Internal Medicine

## 2014-06-21 VITALS — BP 126/90 | HR 69 | Temp 98.0°F | Resp 20 | Ht 69.5 in | Wt 266.0 lb

## 2014-06-21 DIAGNOSIS — R05 Cough: Secondary | ICD-10-CM

## 2014-06-21 DIAGNOSIS — I2510 Atherosclerotic heart disease of native coronary artery without angina pectoris: Secondary | ICD-10-CM

## 2014-06-21 DIAGNOSIS — I10 Essential (primary) hypertension: Secondary | ICD-10-CM

## 2014-06-21 DIAGNOSIS — R059 Cough, unspecified: Secondary | ICD-10-CM

## 2014-06-21 DIAGNOSIS — J069 Acute upper respiratory infection, unspecified: Secondary | ICD-10-CM

## 2014-06-21 MED ORDER — PREDNISONE 20 MG PO TABS: 20.0000 mg | ORAL_TABLET | Freq: Two times a day (BID) | ORAL | Status: DC

## 2014-06-21 MED ORDER — LOSARTAN POTASSIUM 100 MG PO TABS: 100.0000 mg | ORAL_TABLET | Freq: Every day | ORAL | Status: DC

## 2014-06-21 MED ORDER — HYDROCODONE-HOMATROPINE 5-1.5 MG/5ML PO SYRP: 5.0000 mL | ORAL_SOLUTION | Freq: Three times a day (TID) | ORAL | Status: DC | PRN

## 2014-06-21 MED ORDER — FLUTICASONE PROPIONATE 50 MCG/ACT NA SUSP: 2.0000 | Freq: Every day | NASAL | Status: DC

## 2014-06-21 NOTE — Patient Instructions (Signed)
Use saline irrigation, warm  moist compresses and over-the-counter decongestants only as directed.  Call if there is no improvement in 5 to 7 days, or sooner if you develop increasing pain, fever, or any new symptoms.  A cool mist vaporizer may also be used to help thin bronchial secretions and make it easier to clear the chest.  HOME CARE INSTRUCTIONS  Get plenty of rest.  Drink enough fluids to keep your urine clear or pale yellow (unless you have a medical condition that requires fluid restriction). Increasing fluids may help thin your respiratory secretions (sputum) and reduce chest congestion, and it will prevent dehydration.    Use  a nonsedating antihistamine such as Zyrtec or Allegra daily

## 2014-06-21 NOTE — Progress Notes (Signed)
Subjective:    Patient ID: David Vincent, male    DOB: 1951-01-27, 64 y.o.   MRN: [id number]  HPI 64 year old patient who has a history of hypertension treated with benazepril.  He presents with a chief complaint of cough.  This has been present since December of last year.  He describes  sinus and chest congestion and occasionally yellow sputum production.  No wheezing.  Symptoms are worse at night.  Past Medical History  Diagnosis Date  . BACK PAIN 03/10/2007  . HYPERTENSION 11/15/2006  . INGUINAL HERNIA, RIGHT 12/05/2009  . LEG CRAMPS 12/03/2008  . SLEEP APNEA 11/16/2008  . VERTIGO, POSITIONAL 08/08/2009  . ED (erectile dysfunction)   . History of mumps orchitis   . GERD (gastroesophageal reflux disease)   . CAD (coronary artery disease)     A.  01/05/2002 Cath - nonobs dzs (LAD 20prox, RI 30-40 prox)  . Unstable angina     History   Social History  . Marital Status: Married    Spouse Name: N/A  . Number of Children: N/A  . Years of Education: N/A   Occupational History  . Not on file.   Social History Main Topics  . Smoking status: Never Smoker   . Smokeless tobacco: Never Used  . Alcohol Use: No  . Drug Use: No  . Sexual Activity: Not on file   Other Topics Concern  . Not on file   Social History Narrative   05/02/2011:  Currently unemployed.  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  Does not exercise or adhere to any specific diet.    Past Surgical History  Procedure Laterality Date  . Cardiac catheterization    . Knee arthroscopy      Right    Family History  Problem Relation Age of Onset  . Heart attack Father     died 30  . Pneumonia Mother     died 2 - hip fx/pna  . Hypertension Sister     alive  . Cancer Brother     died - agent orange exposure  . Hypertension Brother     alive    Allergies  Allergen Reactions  . Codeine Phosphate     REACTION: unspecified  . Penicillins     REACTION: unspecified    Current  Outpatient Prescriptions on File Prior to Visit  Medication Sig Dispense Refill  . atorvastatin (LIPITOR) 20 MG tablet Take 1 tablet (20 mg total) by mouth daily. 90 tablet 3  . benazepril (LOTENSIN) 20 MG tablet Take 1 tablet (20 mg total) by mouth daily. 90 tablet 3  . EPINEPHrine 0.3 mg/0.3 mL IJ SOAJ injection Inject 0.3 mLs (0.3 mg total) into the muscle once. 1 Device 2  . meclizine (ANTIVERT) 25 MG tablet Take 1 tablet (25 mg total) by mouth 3 (three) times daily as needed. 30 tablet 0   No current facility-administered medications on file prior to visit.    BP 126/90 mmHg  Pulse 69  Temp(Src) 98 F (36.7 C) (Oral)  Resp 20  Ht 5' 9.5" (1.765 m)  Wt 266 lb (120.657 kg)  BMI 38.73 kg/m2  SpO2 96%      Review of Systems  Constitutional: Negative for fever, chills, appetite change and fatigue.  HENT: Positive for congestion, postnasal drip, rhinorrhea and sinus pressure. Negative for dental problem, ear pain, hearing loss, sore throat, tinnitus, trouble swallowing and voice change.   Eyes: Negative for pain,  discharge and visual disturbance.  Respiratory: Positive for cough. Negative for chest tightness, wheezing and stridor.   Cardiovascular: Negative for chest pain, palpitations and leg swelling.  Gastrointestinal: Negative for nausea, vomiting, abdominal pain, diarrhea, constipation, blood in stool and abdominal distention.  Genitourinary: Negative for urgency, hematuria, flank pain, discharge, difficulty urinating and genital sores.  Musculoskeletal: Negative for myalgias, back pain, joint swelling, arthralgias, gait problem and neck stiffness.  Skin: Negative for rash.  Neurological: Negative for dizziness, syncope, speech difficulty, weakness, numbness and headaches.  Hematological: Negative for adenopathy. Does not bruise/bleed easily.  Psychiatric/Behavioral: Negative for behavioral problems and dysphoric mood. The patient is not nervous/anxious.        Objective:     Physical Exam  Constitutional: He is oriented to person, place, and time. He appears well-developed.  HENT:  Head: Normocephalic.  Right Ear: External ear normal.  Left Ear: External ear normal.  Eyes: Conjunctivae and EOM are normal.  Neck: Normal range of motion.  Cardiovascular: Normal rate and normal heart sounds.   Pulmonary/Chest: Effort normal and breath sounds normal. No respiratory distress. He has no wheezes. He has no rales.  Abdominal: Bowel sounds are normal.  Musculoskeletal: Normal range of motion. He exhibits no edema or tenderness.  Neurological: He is alert and oriented to person, place, and time.  Psychiatric: He has a normal mood and affect. His behavior is normal.          Assessment & Plan:   Chronic cough.  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  Will treat rhinorrhea.  Aggressively and substitute the losartan for ACE inhibitor Hypertension, stable History of GERD appears to be asymptomatic

## 2014-06-21 NOTE — Progress Notes (Signed)
Pre visit review using our clinic review tool, if applicable. No additional management support is needed unless otherwise documented below in the visit note. 

## 2014-06-22 ENCOUNTER — Telehealth: Payer: Self-pay | Admitting: Internal Medicine

## 2014-06-22 NOTE — Telephone Encounter (Signed)
Pt not available told wife I will call back.

## 2014-06-22 NOTE — Telephone Encounter (Signed)
pt states the prednisone he got yesterday kept him up all night itching. Pt took benadryl, slowed itching, but still pretty bad. Would like to know what he should do? [facility]

## 2014-06-22 NOTE — Telephone Encounter (Signed)
Discontinue prednisone Continue all other medications

## 2014-06-22 NOTE — Telephone Encounter (Signed)
Please see message and advise 

## 2014-06-22 NOTE — Telephone Encounter (Signed)
Spoke to pt's wife Marylene Landngela, pt not available. Told her to have pt stop Prednisone and continue all other medications per Dr.K. Marylene Landngela verbalized understanding and will let him know.

## 2014-07-14 ENCOUNTER — Encounter: Payer: Self-pay | Admitting: Internal Medicine

## 2014-07-14 ENCOUNTER — Ambulatory Visit (INDEPENDENT_AMBULATORY_CARE_PROVIDER_SITE_OTHER): Payer: 59 | Admitting: Internal Medicine

## 2014-07-14 VITALS — BP 130/80 | HR 76 | Temp 98.0°F | Resp 20 | Ht 69.5 in | Wt 260.0 lb

## 2014-07-14 DIAGNOSIS — J209 Acute bronchitis, unspecified: Secondary | ICD-10-CM

## 2014-07-14 DIAGNOSIS — I10 Essential (primary) hypertension: Secondary | ICD-10-CM

## 2014-07-14 MED ORDER — HYDROCODONE-HOMATROPINE 5-1.5 MG/5ML PO SYRP
5.0000 mL | ORAL_SOLUTION | Freq: Four times a day (QID) | ORAL | Status: DC | PRN
Start: 2014-07-14 — End: 2014-08-24

## 2014-07-14 MED ORDER — AZITHROMYCIN 250 MG PO TABS: ORAL_TABLET | ORAL | Status: DC

## 2014-07-14 NOTE — Progress Notes (Signed)
Subjective:    Patient ID: David Vincent, male    DOB: 19-Jun-1950, 64 y.o.   MRN: [id number]  HPI 64 year old patient who is seen today for follow-up.  He has essential hypertension.  He was seen recently and treated symptomatically for a suspected viral bronchitis.  Lisinopril was discontinued and losartan substituted due to chronic cough.  He continues to have chest congestion and cough.  Cough is now worse and then he has been expectorating green sputum.  He generally feels unwell.  There is been some low-grade fever.  Past Medical History  Diagnosis Date  . BACK PAIN 03/10/2007  . HYPERTENSION 11/15/2006  . INGUINAL HERNIA, RIGHT 12/05/2009  . LEG CRAMPS 12/03/2008  . SLEEP APNEA 11/16/2008  . VERTIGO, POSITIONAL 08/08/2009  . ED (erectile dysfunction)   . History of mumps orchitis   . GERD (gastroesophageal reflux disease)   . CAD (coronary artery disease)     A.  01/05/2002 Cath - nonobs dzs (LAD 20prox, RI 30-40 prox)  . Unstable angina     History   Social History  . Marital Status: Married    Spouse Name: N/A  . Number of Children: N/A  . Years of Education: N/A   Occupational History  . Not on file.   Social History Main Topics  . Smoking status: Never Smoker   . Smokeless tobacco: Never Used  . Alcohol Use: No  . Drug Use: No  . Sexual Activity: Not on file   Other Topics Concern  . Not on file   Social History Narrative   05/02/2011:  Currently unemployed.  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  Does not exercise or adhere to any specific diet.    Past Surgical History  Procedure Laterality Date  . Cardiac catheterization    . Knee arthroscopy      Right    Family History  Problem Relation Age of Onset  . Heart attack Father     died 79  . Pneumonia Mother     died 44 - hip fx/pna  . Hypertension Sister     alive  . Cancer Brother     died - agent orange exposure  . Hypertension Brother     alive    Allergies  Allergen  Reactions  . Prednisone Itching  . Codeine Phosphate     REACTION: unspecified  . Penicillins     REACTION: unspecified    Current Outpatient Prescriptions on File Prior to Visit  Medication Sig Dispense Refill  . atorvastatin (LIPITOR) 20 MG tablet Take 1 tablet (20 mg total) by mouth daily. 90 tablet 3  . EPINEPHrine 0.3 mg/0.3 mL IJ SOAJ injection Inject 0.3 mLs (0.3 mg total) into the muscle once. 1 Device 2  . fluticasone (FLONASE) 50 MCG/ACT nasal spray Place 2 sprays into both nostrils daily. 16 g 6  . HYDROcodone-homatropine (HYCODAN) 5-1.5 MG/5ML syrup Take 5 mLs by mouth every 8 (eight) hours as needed for cough. 120 mL 0  . losartan (COZAAR) 100 MG tablet Take 1 tablet (100 mg total) by mouth daily. 90 tablet 3  . meclizine (ANTIVERT) 25 MG tablet Take 1 tablet (25 mg total) by mouth 3 (three) times daily as needed. 30 tablet 0   No current facility-administered medications on file prior to visit.    BP 130/80 mmHg  Pulse 76  Temp(Src) 98 F (36.7 C) (Oral)  Resp 20  Ht 5' 9.5" (1.765 m)  Wt 260 lb (117.935 kg)  BMI 37.86 kg/m2  SpO2 98%      Review of Systems  Constitutional: Positive for fever and fatigue. Negative for chills and appetite change.  HENT: Negative for congestion, dental problem, ear pain, hearing loss, sore throat, tinnitus, trouble swallowing and voice change.   Eyes: Negative for pain, discharge and visual disturbance.  Respiratory: Positive for cough. Negative for chest tightness, wheezing and stridor.   Cardiovascular: Negative for chest pain, palpitations and leg swelling.  Gastrointestinal: Negative for nausea, vomiting, abdominal pain, diarrhea, constipation, blood in stool and abdominal distention.  Genitourinary: Negative for urgency, hematuria, flank pain, discharge, difficulty urinating and genital sores.  Musculoskeletal: Negative for myalgias, back pain, joint swelling, arthralgias, gait problem and neck stiffness.  Skin: Negative  for rash.  Neurological: Positive for weakness. Negative for dizziness, syncope, speech difficulty, numbness and headaches.  Hematological: Negative for adenopathy. Does not bruise/bleed easily.  Psychiatric/Behavioral: Negative for behavioral problems and dysphoric mood. The patient is not nervous/anxious.        Objective:   Physical Exam  Constitutional: He is oriented to person, place, and time. He appears well-developed.  HENT:  Head: Normocephalic.  Right Ear: External ear normal.  Left Ear: External ear normal.  Eyes: Conjunctivae and EOM are normal.  Neck: Normal range of motion.  Cardiovascular: Normal rate and normal heart sounds.   Pulmonary/Chest: Effort normal. He has no wheezes.  Scattered coarse rhonchi heard anteriorly  Abdominal: Bowel sounds are normal.  Musculoskeletal: Normal range of motion. He exhibits no edema or tenderness.  Neurological: He is alert and oriented to person, place, and time.  Psychiatric: He has a normal mood and affect. His behavior is normal.          Assessment & Plan:   Refractory bronchitis.  Possible secondary bacterial infection.  Will treat with azithromycin.  We'll continue expectorants antitussives and liberal fluid intake Hypertension, stable on losartan.  We'll continue

## 2014-07-14 NOTE — Progress Notes (Signed)
Pre visit review using our clinic review tool, if applicable. No additional management support is needed unless otherwise documented below in the visit note. 

## 2014-07-14 NOTE — Patient Instructions (Signed)
Take over-the-counter expectorants and cough medications such as  Mucinex DM.  Call if there is no improvement in 5 to 7 days or if  you develop worsening cough, fever, or new symptoms, such as shortness of breath or chest pain.  HOME CARE INSTRUCTIONS  Get plenty of rest.  Drink enough fluids to keep your urine clear or pale yellow (unless you have a medical condition that requires fluid restriction). Increasing fluids may help thin your respiratory secretions (sputum) and reduce chest congestion, and it will prevent dehydration.  Take medicines only as directed by your health care provider.  If you were prescribed an antibiotic medicine, finish it all even if you start to feel better.  Avoid smoking and secondhand smoke. Exposure to cigarette smoke or irritating chemicals will make bronchitis worse. If you are a smoker, consider using nicotine gum or skin patches to help control withdrawal symptoms. Quitting smoking will help your lungs heal faster.  Reduce the chances of another bout of acute bronchitis by washing your hands frequently, avoiding people with cold symptoms, and trying not to touch your hands to your mouth, nose, or eyes.

## 2014-08-24 ENCOUNTER — Ambulatory Visit (INDEPENDENT_AMBULATORY_CARE_PROVIDER_SITE_OTHER): Payer: 59 | Admitting: Internal Medicine

## 2014-08-24 ENCOUNTER — Encounter: Payer: Self-pay | Admitting: Internal Medicine

## 2014-08-24 VITALS — BP 122/80 | HR 77 | Temp 98.2°F | Resp 20 | Ht 69.5 in | Wt 268.0 lb

## 2014-08-24 DIAGNOSIS — R519 Headache, unspecified: Secondary | ICD-10-CM

## 2014-08-24 DIAGNOSIS — N521 Erectile dysfunction due to diseases classified elsewhere: Secondary | ICD-10-CM | POA: Insufficient documentation

## 2014-08-24 DIAGNOSIS — E291 Testicular hypofunction: Secondary | ICD-10-CM | POA: Diagnosis not present

## 2014-08-24 DIAGNOSIS — I10 Essential (primary) hypertension: Secondary | ICD-10-CM | POA: Diagnosis not present

## 2014-08-24 DIAGNOSIS — R51 Headache: Secondary | ICD-10-CM

## 2014-08-24 DIAGNOSIS — R7989 Other specified abnormal findings of blood chemistry: Secondary | ICD-10-CM | POA: Insufficient documentation

## 2014-08-24 LAB — TESTOSTERONE: Testosterone: 109.58 ng/dL — ABNORMAL LOW (ref 300.00–890.00)

## 2014-08-24 MED ORDER — TRAMADOL HCL 50 MG PO TABS: 50.0000 mg | ORAL_TABLET | Freq: Three times a day (TID) | ORAL | Status: DC | PRN

## 2014-08-24 NOTE — Progress Notes (Signed)
Pre visit review using our clinic review tool, if applicable. No additional management support is needed unless otherwise documented below in the visit note. 

## 2014-08-24 NOTE — Progress Notes (Signed)
Subjective:    Patient ID: David Vincent, male    DOB: 02/10/1951, 64 y.o.   MRN: [id number]  HPI  64 year old patient who has essential hypertension.  He fell approximately 2 weeks ago when he slipped on his porch in the rain sustaining trauma to his right shoulder area for the past 2 weeks he has had persistent shoulder pain as well as pain in the right lateral neck and intermittent headaches.  No focal neurological symptoms.  He has been using Aleve with some benefit.  Patient also complains of some ED issues.  There have not been well treated with Viagra or Levitra.  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 BACK PAIN 03/10/2007  . HYPERTENSION 11/15/2006  . INGUINAL HERNIA, RIGHT 12/05/2009  . LEG CRAMPS 12/03/2008  . SLEEP APNEA 11/16/2008  . VERTIGO, POSITIONAL 08/08/2009  . ED (erectile dysfunction)   . History of mumps orchitis   . GERD (gastroesophageal reflux disease)   . CAD (coronary artery disease)     A.  01/05/2002 Cath - nonobs dzs (LAD 20prox, RI 30-40 prox)  . Unstable angina     History   Social History  . Marital Status: Married    Spouse Name: N/A  . Number of Children: N/A  . Years of Education: N/A   Occupational History  . Not on file.   Social History Main Topics  . Smoking status: Never Smoker   . Smokeless tobacco: Never Used  . Alcohol Use: No  . Drug Use: No  . Sexual Activity: Not on file   Other Topics Concern  . Not on file   Social History Narrative   05/02/2011:  Currently unemployed.  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  Does not exercise or adhere to any specific diet.    Past Surgical History  Procedure Laterality Date  . Cardiac catheterization    . Knee arthroscopy      Right    Family History  Problem Relation Age of Onset  . Heart attack Father     died 2490  . Pneumonia Mother     died 1488 - hip fx/pna  . Hypertension Sister     alive    . Cancer Brother     died - agent orange exposure  . Hypertension Brother     alive    Allergies  Allergen Reactions  . Prednisone Itching  . Codeine Phosphate     REACTION: unspecified  . Penicillins     REACTION: unspecified    Current Outpatient Prescriptions on File Prior to Visit  Medication Sig Dispense Refill  . atorvastatin (LIPITOR) 20 MG tablet Take 1 tablet (20 mg total) by mouth daily. 90 tablet 3  . EPINEPHrine 0.3 mg/0.3 mL IJ SOAJ injection Inject 0.3 mLs (0.3 mg total) into the muscle once. 1 Device 2  . fluticasone (FLONASE) 50 MCG/ACT nasal spray Place 2 sprays into both nostrils daily. 16 g 6  . losartan (COZAAR) 100 MG tablet Take 1 tablet (100 mg total) by mouth daily. 90 tablet 3  . meclizine (ANTIVERT) 25 MG tablet Take 1 tablet (25 mg total) by mouth 3 (three) times daily as needed. 30 tablet 0  . HYDROcodone-homatropine (HYCODAN) 5-1.5 MG/5ML syrup Take 5 mLs by mouth every 6 (six) hours as needed for cough. (Patient not taking: Reported on 08/24/2014) 120 mL 0  No current facility-administered medications on file prior to visit.    BP 122/80 mmHg  Pulse 77  Temp(Src) 98.2 F (36.8 C) (Oral)  Resp 20  Ht 5' 9.5" (1.765 m)  Wt 268 lb (121.564 kg)  BMI 39.02 kg/m2  SpO2 97%     Review of Systems  Constitutional: Negative for fever, chills, appetite change and fatigue.  HENT: Negative for congestion, dental problem, ear pain, hearing loss, sore throat, tinnitus, trouble swallowing and voice change.   Eyes: Negative for pain, discharge and visual disturbance.  Respiratory: Negative for cough, chest tightness, wheezing and stridor.   Cardiovascular: Negative for chest pain, palpitations and leg swelling.  Gastrointestinal: Negative for nausea, vomiting, abdominal pain, diarrhea, constipation, blood in stool and abdominal distention.  Genitourinary: Negative for urgency, hematuria, flank pain, discharge, difficulty urinating and genital sores.   Musculoskeletal: Positive for arthralgias and neck pain. Negative for myalgias, back pain, joint swelling, gait problem and neck stiffness.  Skin: Negative for rash.  Neurological: Positive for headaches. Negative for dizziness, syncope, speech difficulty, weakness and numbness.  Hematological: Negative for adenopathy. Does not bruise/bleed easily.  Psychiatric/Behavioral: Negative for behavioral problems and dysphoric mood. The patient is not nervous/anxious.        Objective:   Physical Exam  Constitutional: He is oriented to person, place, and time. He appears well-developed.  HENT:  Head: Normocephalic.  Right Ear: External ear normal.  Left Ear: External ear normal.  Eyes: Conjunctivae and EOM are normal.  Neck: Normal range of motion.  Cardiovascular: Normal rate and normal heart sounds.   Pulmonary/Chest: Breath sounds normal.  Abdominal: Bowel sounds are normal.  Musculoskeletal: Normal range of motion. He exhibits no edema or tenderness.  Full range of motion of the right shoulder.  Tenderness noted in the right shoulder area as well as the right lateral neck.  Neck pain aggravated by head turning to the left  Neurological: He is alert and oriented to person, place, and time. No cranial nerve deficit. Coordination normal.  Psychiatric: He has a normal mood and affect. His behavior is normal.          Assessment & Plan:   Musculoskeletal pain following trauma.  We'll continue Aleve which has been helpful.  Was given a prescription for tramadol as well as a short-term muscle relaxer Hypertension, stable ED.  we'll give samples of Cialis.  Will check a early morning testosterone level

## 2014-08-24 NOTE — Patient Instructions (Signed)
Call or return to clinic prn if these symptoms worsen or fail to improve as anticipated.  Limit your sodium (Salt) intake  Please check your blood pressure on a regular basis.  If it is consistently greater than 150/90, please make an office appointment.  Return in 6 months for follow-up   Amrix  One daily  Continue aleve  Tramadol for pain control

## 2014-08-25 ENCOUNTER — Telehealth: Payer: Self-pay | Admitting: Internal Medicine

## 2014-08-25 NOTE — Telephone Encounter (Signed)
Spoke to pt about Testosterone level, he will check insurance and call me back.

## 2014-08-25 NOTE — Telephone Encounter (Signed)
Pt returning your call about lab results. Please note new cell phone number

## 2014-09-02 NOTE — Telephone Encounter (Signed)
Left message on voicemail to call office.  

## 2014-09-07 NOTE — Telephone Encounter (Signed)
Patient called back stating his insurance needed to know more information about the testosterone medication before they could give him names of any preferred from the list.

## 2014-09-08 NOTE — Telephone Encounter (Signed)
Left message on voicemail to call office on mobile and left message with wife to call office.

## 2014-09-20 ENCOUNTER — Ambulatory Visit: Payer: Self-pay | Admitting: Internal Medicine

## 2014-09-24 NOTE — Telephone Encounter (Signed)
Spoke to pt, told him need to know what Testosterone Therapy is on his formulary list of medications that insurance will pay. Told pt his insurance company should be able to tell him. Pt verbalized understanding and will call insurance again.

## 2014-09-29 ENCOUNTER — Telehealth: Payer: Self-pay | Admitting: Internal Medicine

## 2014-09-29 DIAGNOSIS — R7989 Other specified abnormal findings of blood chemistry: Secondary | ICD-10-CM

## 2014-09-29 DIAGNOSIS — N521 Erectile dysfunction due to diseases classified elsewhere: Secondary | ICD-10-CM

## 2014-09-29 NOTE — Telephone Encounter (Signed)
Pt states his insurance will cover injection cyp 200 mg / pp 200mg  / testum gel/ pestil 15 mg  Pt states he could not understand the girl very well and wrote down what he could make

## 2014-09-30 MED ORDER — TESTOSTERONE 50 MG/5GM (1%) TD GEL: TRANSDERMAL | Status: DC

## 2014-09-30 NOTE — Telephone Encounter (Signed)
Dr. Kirtland Bouchard , please advise what Testosterone Therapy you would like pt to start, see message below.

## 2014-09-30 NOTE — Telephone Encounter (Signed)
Spoke to pt, told her Rx for Testim 50 mg Gel was sent to pharmacy, apply one application every morning to shoulder and upper arm area. Also Dr.K would like to check Testosterone Level again in 4 weeks after starting medication, just need to make a lab appt. Pt verbalized understanding. Order put in [facility]

## 2014-09-30 NOTE — Telephone Encounter (Signed)
Left message with pt's wife to call office. Rx faxed to pharmacy.

## 2014-09-30 NOTE — Telephone Encounter (Signed)
Start Testim 50 mg applied every morning to the shoulder and upper arm area Check testosterone level in 4 weeks after start

## 2014-10-04 ENCOUNTER — Telehealth: Payer: Self-pay | Admitting: Internal Medicine

## 2014-10-04 NOTE — Telephone Encounter (Signed)
[employer] denied PA for Testim.  Patient's plan requires patient to have 2 pre-treatment levels less than normal reference range and patient only has 1.

## 2014-10-04 NOTE — Telephone Encounter (Signed)
Please repeat early-morning testosterone level.  Both total testosterone and free testosterone

## 2014-10-04 NOTE — Telephone Encounter (Signed)
Please see message and advise 

## 2014-10-05 NOTE — Telephone Encounter (Signed)
Spoke to pt, told him Testosterone medication was denied due to need to have 2 abnormal levels of Testosterone. Told pt need to come in for lab work again to have Testosterone checked. Pt verbalized understanding and will call back to schedule lab appoint ment for next week. Told him that is fine orders will be in. Pt verbalized understanding.

## 2014-10-08 ENCOUNTER — Telehealth: Payer: Self-pay | Admitting: Internal Medicine

## 2014-10-08 DIAGNOSIS — R7989 Other specified abnormal findings of blood chemistry: Secondary | ICD-10-CM

## 2014-10-08 NOTE — Telephone Encounter (Signed)
Pt states his insurance will not pay for the testosterone labs, so he will not get them done.

## 2014-10-12 NOTE — Telephone Encounter (Signed)
FYI

## 2014-10-12 NOTE — Telephone Encounter (Signed)
It was my understanding that the insurance company required a second confirmatory testosterone level pretreatment

## 2014-10-13 NOTE — Telephone Encounter (Signed)
Marchelle Folksmanda, here is the pt. Please look into why labs will not be pain when insurance said he needed to have them done again. Thanks

## 2014-10-14 NOTE — Telephone Encounter (Signed)
Called patient and [facility] two Testerone levels < 250 before they will consider a PA for his Testim. I explained to patient his cost for the lab is estimated at $22.40 and that he would need to contact the pharmacy for his Testim cost as his pharmacy benefit and medical benefit are separate. Pt voiced understanding and is scheduled to have his Testerone level drawn on 10/15/14.

## 2014-10-14 NOTE — Telephone Encounter (Signed)
Order put in [employer].

## 2014-10-15 ENCOUNTER — Other Ambulatory Visit (INDEPENDENT_AMBULATORY_CARE_PROVIDER_SITE_OTHER): Payer: 59

## 2014-10-15 ENCOUNTER — Telehealth: Payer: Self-pay | Admitting: Internal Medicine

## 2014-10-15 DIAGNOSIS — E291 Testicular hypofunction: Secondary | ICD-10-CM

## 2014-10-15 DIAGNOSIS — R7989 Other specified abnormal findings of blood chemistry: Secondary | ICD-10-CM

## 2014-10-15 LAB — TESTOSTERONE, FREE, TOTAL, SHBG
SEX HORMONE BINDING: 29 nmol/L (ref 22–77)
TESTOSTERONE: 62 ng/dL — AB (ref 300–890)
Testosterone, Free: 12.1 pg/mL — ABNORMAL LOW (ref 47.0–244.0)
Testosterone-% Free: 1.9 % (ref 1.6–2.9)

## 2014-10-15 LAB — TESTOSTERONE: Testosterone: 62.53 ng/dL — ABNORMAL LOW (ref 300.00–890.00)

## 2014-10-15 NOTE — Telephone Encounter (Signed)
Initiated a second PA request for patients Testim Gel. [id number]

## 2015-02-24 ENCOUNTER — Ambulatory Visit: Payer: Self-pay | Admitting: Internal Medicine

## 2015-03-07 ENCOUNTER — Ambulatory Visit (INDEPENDENT_AMBULATORY_CARE_PROVIDER_SITE_OTHER): Payer: 59 | Admitting: Internal Medicine

## 2015-03-07 ENCOUNTER — Encounter: Payer: Self-pay | Admitting: Internal Medicine

## 2015-03-07 VITALS — BP 130/72 | HR 68 | Temp 97.9°F | Resp 20 | Ht 69.5 in | Wt 265.0 lb

## 2015-03-07 DIAGNOSIS — Z23 Encounter for immunization: Secondary | ICD-10-CM | POA: Diagnosis not present

## 2015-03-07 DIAGNOSIS — I10 Essential (primary) hypertension: Secondary | ICD-10-CM

## 2015-03-07 NOTE — Progress Notes (Signed)
Subjective:    Patient ID: David Vincent, male    DOB: [birth year], 64 y.o.   MRN: [id number]  HPI  64 year old patient seen today for his six-month follow-up.  He has treated hypertension, obesity and OSA.  He is doing well except for some right shoulder pain.  He has had PT and orthopedic evaluation and a recent MRI.  He anticipates shoulder surgery for chronic shoulder pain.  No other concerns or complaints  Past Medical History  Diagnosis Date  . BACK PAIN 03/10/2007  . HYPERTENSION 11/15/2006  . INGUINAL HERNIA, RIGHT 12/05/2009  . LEG CRAMPS 12/03/2008  . SLEEP APNEA 11/16/2008  . VERTIGO, POSITIONAL 08/08/2009  . ED (erectile dysfunction)   . History of mumps orchitis   . GERD (gastroesophageal reflux disease)   . CAD (coronary artery disease)     A.  01/05/2002 Cath - nonobs dzs (LAD 20prox, RI 30-40 prox)  . Unstable angina [facility])     Social History   Social History  . Marital Status: Married    Spouse Name: N/A  . Number of Children: N/A  . Years of Education: N/A   Occupational History  . Not on file.   Social History Main Topics  . Smoking status: Never Smoker   . Smokeless tobacco: Never Used  . Alcohol Use: No  . Drug Use: No  . Sexual Activity: Not on file   Other Topics Concern  . Not on file   Social History Narrative   05/02/2011:  Currently unemployed.  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  Does not exercise or adhere to any specific diet.    Past Surgical History  Procedure Laterality Date  . Cardiac catheterization    . Knee arthroscopy      Right    Family History  Problem Relation Age of Onset  . Heart attack Father     died 41  . Pneumonia Mother     died 85 - hip fx/pna  . Hypertension Sister     alive  . Cancer Brother     died - agent orange exposure  . Hypertension Brother     alive    Allergies  Allergen Reactions  . Prednisone Itching  . Codeine Phosphate     REACTION: unspecified  . Penicillins    REACTION: unspecified    Current Outpatient Prescriptions on File Prior to Visit  Medication Sig Dispense Refill  . atorvastatin (LIPITOR) 20 MG tablet Take 1 tablet (20 mg total) by mouth daily. 90 tablet 3  . EPINEPHrine 0.3 mg/0.3 mL IJ SOAJ injection Inject 0.3 mLs (0.3 mg total) into the muscle once. 1 Device 2  . losartan (COZAAR) 100 MG tablet Take 1 tablet (100 mg total) by mouth daily. 90 tablet 3  . meclizine (ANTIVERT) 25 MG tablet Take 1 tablet (25 mg total) by mouth 3 (three) times daily as needed. 30 tablet 0  . fluticasone (FLONASE) 50 MCG/ACT nasal spray Place 2 sprays into both nostrils daily. (Patient not taking: Reported on 03/07/2015) 16 g 6   No current facility-administered medications on file prior to visit.    BP 130/72 mmHg  Pulse 68  Temp(Src) 97.9 F (36.6 C) (Oral)  Resp 20  Ht 5' 9.5" (1.765 m)  Wt 265 lb (120.203 kg)  BMI 38.59 kg/m2  SpO2 95%     Review of Systems  Constitutional: Negative for fever, chills, appetite change and fatigue.  HENT: Negative  for congestion, dental problem, ear pain, hearing loss, sore throat, tinnitus, trouble swallowing and voice change.   Eyes: Negative for pain, discharge and visual disturbance.  Respiratory: Negative for cough, chest tightness, wheezing and stridor.   Cardiovascular: Negative for chest pain, palpitations and leg swelling.  Gastrointestinal: Negative for nausea, vomiting, abdominal pain, diarrhea, constipation, blood in stool and abdominal distention.  Genitourinary: Negative for urgency, hematuria, flank pain, discharge, difficulty urinating and genital sores.  Musculoskeletal: Negative for myalgias, back pain, joint swelling, arthralgias, gait problem and neck stiffness.       Right shoulder pain of 2 months duration  Skin: Negative for rash.  Neurological: Negative for dizziness, syncope, speech difficulty, weakness, numbness and headaches.  Hematological: Negative for adenopathy. Does not  bruise/bleed easily.  Psychiatric/Behavioral: Negative for behavioral problems and dysphoric mood. The patient is not nervous/anxious.        Objective:   Physical Exam  Constitutional: He is oriented to person, place, and time. He appears well-developed.  Obese Repeat blood pressure 122/70  HENT:  Head: Normocephalic.  Right Ear: External ear normal.  Left Ear: External ear normal.  Eyes: Conjunctivae and EOM are normal.  Neck: Normal range of motion.  Cardiovascular: Normal rate and normal heart sounds.   Pulmonary/Chest: Breath sounds normal.  Abdominal: Bowel sounds are normal.  Musculoskeletal: Normal range of motion. He exhibits no edema or tenderness.  Neurological: He is alert and oriented to person, place, and time.  Psychiatric: He has a normal mood and affect. His behavior is normal.          Assessment & Plan:   Hypertension, well-controlled Right shoulder pain.  Orthopedic follow-up Obesity, OSA.  Weight loss encouraged  No change in medical regimen Flu vaccine, declined CPX 6 months

## 2015-03-07 NOTE — Progress Notes (Signed)
Pre visit review using our clinic review tool, if applicable. No additional management support is needed unless otherwise documented below in the visit note. 

## 2015-03-07 NOTE — Patient Instructions (Signed)
Limit your sodium (Salt) intake    It is important that you exercise regularly, at least 20 minutes 3 to 4 times per week.  If you develop chest pain or shortness of breath seek  medical attention.  Please check your blood pressure on a regular basis.  If it is consistently greater than 150/90, please make an office appointment.  Return in 6 months for follow-up   

## 2015-05-11 ENCOUNTER — Other Ambulatory Visit: Payer: Self-pay | Admitting: Internal Medicine

## 2015-05-11 MED ORDER — ATORVASTATIN CALCIUM 20 MG PO TABS: 20.0000 mg | ORAL_TABLET | Freq: Every day | ORAL | Status: DC

## 2015-07-13 ENCOUNTER — Telehealth: Payer: Self-pay | Admitting: Internal Medicine

## 2015-07-13 MED ORDER — LOSARTAN POTASSIUM 100 MG PO TABS: 100.0000 mg | ORAL_TABLET | Freq: Every day | ORAL | Status: DC

## 2015-07-13 NOTE — Telephone Encounter (Signed)
Pt needs refill for Losartan 100mg .  [address] [facility] [address]

## 2015-07-13 NOTE — Telephone Encounter (Signed)
Spoke to pt, told him Rx was sent to pharmacy. Also told pt needs to schedule physical you are overdue. Pt verbalized understanding and will call back and schedule,

## 2015-08-03 ENCOUNTER — Telehealth: Payer: Self-pay | Admitting: Internal Medicine

## 2015-08-03 MED ORDER — EPINEPHRINE 0.3 MG/0.3ML IJ SOAJ: 0.3000 mg | Freq: Once | INTRAMUSCULAR | Status: DC

## 2015-08-03 NOTE — Telephone Encounter (Signed)
Pt request refill of the following:    EPINEPHrine 0.3 mg/0.3 mL IJ SOAJ injection   It is geeting warmer and pt will need to have this     Phamacy:   [facility] [address]

## 2015-08-03 NOTE — Telephone Encounter (Signed)
Pt's wife Marylene Landngela notified Rx sent to pharmacy for pt.

## 2015-08-30 ENCOUNTER — Other Ambulatory Visit (INDEPENDENT_AMBULATORY_CARE_PROVIDER_SITE_OTHER): Payer: BLUE CROSS/BLUE SHIELD

## 2015-08-30 DIAGNOSIS — Z0000 Encounter for general adult medical examination without abnormal findings: Secondary | ICD-10-CM

## 2015-08-30 LAB — HEPATIC FUNCTION PANEL
ALT: 15 U/L (ref 0–53)
AST: 18 U/L (ref 0–37)
Albumin: 4.4 g/dL (ref 3.5–5.2)
Alkaline Phosphatase: 77 U/L (ref 39–117)
BILIRUBIN DIRECT: 0.1 mg/dL (ref 0.0–0.3)
BILIRUBIN TOTAL: 0.7 mg/dL (ref 0.2–1.2)
Total Protein: 6.4 g/dL (ref 6.0–8.3)

## 2015-08-30 LAB — POC URINALSYSI DIPSTICK (AUTOMATED)
BILIRUBIN UA: NEGATIVE
GLUCOSE UA: NEGATIVE
Ketones, UA: NEGATIVE
LEUKOCYTES UA: NEGATIVE
NITRITE UA: NEGATIVE
Protein, UA: NEGATIVE
Spec Grav, UA: 1.02
Urobilinogen, UA: 0.2
pH, UA: 6

## 2015-08-30 LAB — BASIC METABOLIC PANEL
BUN: 19 mg/dL (ref 6–23)
CO2: 27 mEq/L (ref 19–32)
CREATININE: 0.9 mg/dL (ref 0.40–1.50)
Calcium: 9.7 mg/dL (ref 8.4–10.5)
Chloride: 107 mEq/L (ref 96–112)
GFR: 90.09 mL/min (ref >60.00)
GLUCOSE: 84 mg/dL (ref 70–99)
Potassium: 4.3 mEq/L (ref 3.5–5.1)
Sodium: 142 mEq/L (ref 135–145)

## 2015-08-30 LAB — CBC WITH DIFFERENTIAL/PLATELET
Basophils Absolute: 0.1 10*3/uL (ref 0.0–0.1)
Basophils Relative: 0.9 % (ref 0.0–3.0)
EOS ABS: 0.3 10*3/uL (ref 0.0–0.7)
Eosinophils Relative: 3.5 % (ref 0.0–5.0)
HCT: 39.9 % (ref 39.0–52.0)
HEMOGLOBIN: 13.7 g/dL (ref 13.0–17.0)
LYMPHS ABS: 2.4 10*3/uL (ref 0.7–4.0)
Lymphocytes Relative: 32.2 % (ref 12.0–46.0)
MCHC: 34.4 g/dL (ref 30.0–36.0)
MCV: 93.2 fl (ref 78.0–100.0)
MONO ABS: 0.6 10*3/uL (ref 0.1–1.0)
Monocytes Relative: 7.8 % (ref 3.0–12.0)
NEUTROS PCT: 55.6 % (ref 43.0–77.0)
Neutro Abs: 4.1 10*3/uL (ref 1.4–7.7)
Platelets: 260 10*3/uL (ref 150.0–400.0)
RBC: 4.28 Mil/uL (ref 4.22–5.81)
RDW: 14.4 % (ref 11.5–15.5)
WBC: 7.4 10*3/uL (ref 4.0–10.5)

## 2015-08-30 LAB — PSA: PSA: 0.37 ng/mL (ref 0.10–4.00)

## 2015-08-30 LAB — LIPID PANEL
CHOL/HDL RATIO: 4
CHOLESTEROL: 102 mg/dL (ref 0–200)
HDL: 29.1 mg/dL — AB (ref >39.00)
LDL CALC: 43 mg/dL (ref 0–99)
NonHDL: 72.86
TRIGLYCERIDES: 151 mg/dL — AB (ref 0.0–149.0)
VLDL: 30.2 mg/dL (ref 0.0–40.0)

## 2015-08-30 LAB — TSH: TSH: 0.92 u[IU]/mL (ref 0.35–4.50)

## 2015-09-06 ENCOUNTER — Ambulatory Visit: Payer: Self-pay | Admitting: Internal Medicine

## 2015-09-06 ENCOUNTER — Encounter: Payer: Self-pay | Admitting: Internal Medicine

## 2015-09-08 ENCOUNTER — Encounter (HOSPITAL_COMMUNITY): Payer: Self-pay | Admitting: Emergency Medicine

## 2015-09-08 ENCOUNTER — Emergency Department (HOSPITAL_COMMUNITY)
Admission: EM | Admit: 2015-09-08 | Discharge: 2015-09-09 | Disposition: A | Discharge status: Home / Self Care | Payer: BLUE CROSS/BLUE SHIELD | Attending: Emergency Medicine | Admitting: Emergency Medicine

## 2015-09-08 DIAGNOSIS — R0600 Dyspnea, unspecified: Secondary | ICD-10-CM | POA: Diagnosis not present

## 2015-09-08 DIAGNOSIS — I10 Essential (primary) hypertension: Secondary | ICD-10-CM | POA: Insufficient documentation

## 2015-09-08 DIAGNOSIS — R0602 Shortness of breath: Secondary | ICD-10-CM | POA: Diagnosis not present

## 2015-09-08 DIAGNOSIS — Z79899 Other long term (current) drug therapy: Secondary | ICD-10-CM | POA: Diagnosis not present

## 2015-09-08 DIAGNOSIS — J029 Acute pharyngitis, unspecified: Secondary | ICD-10-CM | POA: Insufficient documentation

## 2015-09-08 DIAGNOSIS — Z880 Allergy status to penicillin: Secondary | ICD-10-CM | POA: Insufficient documentation

## 2015-09-08 DIAGNOSIS — Y9389 Activity, other specified: Secondary | ICD-10-CM | POA: Diagnosis not present

## 2015-09-08 DIAGNOSIS — Z7951 Long term (current) use of inhaled steroids: Secondary | ICD-10-CM | POA: Diagnosis not present

## 2015-09-08 DIAGNOSIS — F419 Anxiety disorder, unspecified: Secondary | ICD-10-CM | POA: Diagnosis not present

## 2015-09-08 DIAGNOSIS — T63441A Toxic effect of venom of bees, accidental (unintentional), initial encounter: Secondary | ICD-10-CM | POA: Insufficient documentation

## 2015-09-08 DIAGNOSIS — I25110 Atherosclerotic heart disease of native coronary artery with unstable angina pectoris: Secondary | ICD-10-CM | POA: Diagnosis not present

## 2015-09-08 DIAGNOSIS — Y9289 Other specified places as the place of occurrence of the external cause: Secondary | ICD-10-CM | POA: Diagnosis not present

## 2015-09-08 DIAGNOSIS — Z9889 Other specified postprocedural states: Secondary | ICD-10-CM | POA: Insufficient documentation

## 2015-09-08 DIAGNOSIS — Y998 Other external cause status: Secondary | ICD-10-CM | POA: Insufficient documentation

## 2015-09-08 DIAGNOSIS — Z8719 Personal history of other diseases of the digestive system: Secondary | ICD-10-CM | POA: Diagnosis not present

## 2015-09-08 DIAGNOSIS — Z8669 Personal history of other diseases of the nervous system and sense organs: Secondary | ICD-10-CM | POA: Insufficient documentation

## 2015-09-08 DIAGNOSIS — R21 Rash and other nonspecific skin eruption: Secondary | ICD-10-CM | POA: Diagnosis present

## 2015-09-08 MED ORDER — FAMOTIDINE IN NACL 20-0.9 MG/50ML-% IV SOLN
20.0000 mg | Freq: Once | INTRAVENOUS | Status: AC
  Administered 2015-09-08: 20 mg via INTRAVENOUS
  Filled 2015-09-08: qty 50

## 2015-09-08 MED ORDER — EPINEPHRINE 0.3 MG/0.3ML IJ SOAJ
INTRAMUSCULAR | Status: AC
  Administered 2015-09-08: 0.3 mg
  Filled 2015-09-08: qty 0.3

## 2015-09-08 MED ORDER — DIPHENHYDRAMINE HCL 50 MG/ML IJ SOLN
INTRAMUSCULAR | Status: AC
  Administered 2015-09-08: 50 mg
  Filled 2015-09-08: qty 1

## 2015-09-08 MED ORDER — METHYLPREDNISOLONE SODIUM SUCC 125 MG IJ SOLR
125.0000 mg | Freq: Once | INTRAMUSCULAR | Status: AC
  Administered 2015-09-08: 125 mg via INTRAVENOUS

## 2015-09-08 MED ORDER — METHYLPREDNISOLONE SODIUM SUCC 125 MG IJ SOLR
INTRAMUSCULAR | Status: AC
  Filled 2015-09-08: qty 2

## 2015-09-08 NOTE — ED Notes (Signed)
Pt was stung by a bee around 815 pm on his left outer calf.  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 Pt c/o of sob and feels like his throat is dry. Pt has no visual oral swelling and denies chest pain.

## 2015-09-08 NOTE — ED Provider Notes (Signed)
CSN: [id number]     Arrival date & time 09/08/15  2103 History   First MD Initiated Contact with Patient 09/08/15 2129     Chief Complaint  Patient presents with  . Allergic Reaction   Patient is a 65 y.o. male presenting with allergic reaction. The history is provided by the patient and medical records. No language interpreter was used.  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 Context comment:  Stung by a bee on LLE Relieved by:  Nothing Worsened by:  Nothing tried Ineffective treatments: Used home epi pen 1 hour pta with initial relief, but symptoms returned.   Past Medical History  Diagnosis Date  . BACK PAIN 03/10/2007  . HYPERTENSION 11/15/2006  . INGUINAL HERNIA, RIGHT 12/05/2009  . LEG CRAMPS 12/03/2008  . SLEEP APNEA 11/16/2008  . VERTIGO, POSITIONAL 08/08/2009  . ED (erectile dysfunction)   . History of mumps orchitis   . GERD (gastroesophageal reflux disease)   . CAD (coronary artery disease)     A.  01/05/2002 Cath - nonobs dzs (LAD 20prox, RI 30-40 prox)  . Unstable angina [facility](HCC)    Past Surgical History  Procedure Laterality Date  . Cardiac catheterization    . Knee arthroscopy      Right   Family History  Problem Relation Age of Onset  . Heart attack Father     died 6890  . Pneumonia Mother     died 7588 - hip fx/pna  . Hypertension Sister     alive  . Cancer Brother     died - agent orange exposure  . Hypertension Brother     alive   Social History  Substance Use Topics  . Smoking status: Never Smoker   . Smokeless tobacco: Never Used  . Alcohol Use: No    Review of Systems  Constitutional: Negative for fever and chills.  HENT: Positive for sore throat. Negative for facial swelling.   Eyes: Negative for visual disturbance.  Respiratory: Positive for shortness of breath. Negative for cough, wheezing and  stridor.   Gastrointestinal: Negative for nausea, vomiting and abdominal pain.  Genitourinary: Negative for dysuria and difficulty urinating.  Musculoskeletal: Negative for back pain and neck pain.  Skin: Positive for itching and rash. Negative for pallor.  Neurological: Negative for dizziness and headaches.  Psychiatric/Behavioral: Negative for confusion.      Allergies  Prednisone; Codeine phosphate; and Penicillins  Home Medications   Prior to Admission medications   Medication Sig Start Date End Date Taking? Authorizing Provider  atorvastatin (LIPITOR) 20 MG tablet Take 1 tablet (20 mg total) by mouth daily. 05/11/15   Gordy SaversPeter F Kwiatkowski, MD  EPINEPHrine 0.3 mg/0.3 mL IJ SOAJ injection Inject 0.3 mLs (0.3 mg total) into the muscle once. 08/03/15   Gordy SaversPeter F Kwiatkowski, MD  fluticasone [facility](FLONASE) 50 MCG/ACT nasal spray Place 2 sprays into both nostrils daily. Patient not taking: Reported on 03/07/2015 06/21/14   Gordy SaversPeter F Kwiatkowski, MD  HYDROcodone-acetaminophen (NORCO/VICODIN) 5-325 MG tablet TAKE 1-2 TABLETS BY MOUTH AT BEDTIME AS NEEDED FOR PAIN 03/02/15   Historical Provider, MD  losartan (COZAAR) 100 MG tablet Take 1 tablet (100 mg total) by mouth daily. 07/13/15   Gordy SaversPeter F Kwiatkowski, MD  meclizine (ANTIVERT) 25 MG tablet Take 1 tablet (25 mg total) by mouth 3 (three) times daily as needed. 03/19/13   Terressa KoyanagiHannah R Kim, DO  meloxicam (MOBIC) 15 MG tablet TAKE 1 TABLET BY MOUTH ONCE A DAY WITH FOOD AS NEEDED FOR INFLAMMATION/PAIN 02/28/15   Historical Provider, MD   BP 137/62 mmHg  Pulse 80  Temp(Src) 98.3 F (36.8 C) (Oral)  Resp 19  Ht  (1.727 m)  Wt 120.203 kg  BMI 40.30 kg/m2  SpO2 98% Physical Exam  Constitutional: He is oriented to person, place, and time. He appears well-developed and well-nourished.  HENT:  Head: Normocephalic and atraumatic.  Mild swelling in posterior oropharynx.   Eyes: EOM are normal. Pupils are equal, round, and reactive to light.  Neck: Normal  range of motion. Neck supple.  Cardiovascular: Normal rate, regular rhythm and intact distal pulses.   Pulmonary/Chest: Effort normal and breath sounds normal. Tachypnea noted.  No stridor or wheezes  Abdominal: Soft. He exhibits no distension. There is no tenderness.  Musculoskeletal: Normal range of motion. He exhibits no edema or tenderness.  Neurological: He is alert and oriented to person, place, and time.  Skin: Skin is warm and dry.  Left lateral shin is mildly erythematous around sting area. No stinger visualized.   Psychiatric:  Anxious   Nursing note and vitals reviewed.   ED Course  Procedures (including critical care time) Labs Review Labs Reviewed - No data to display  Imaging Review No results found. I have personally reviewed and evaluated these images and lab results as part of my medical decision-making.   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 Patient reports that he signed by a bee about an hour prior to arrival. He experienced immediate itching and discomfort at the sting site. In addition he had shortness of breath, itchiness in his throat and a feeling that his throat was closing. He used home epi at about 8:30 PM. Initially feeling better but symptoms returned and he presented to the emergency department. He reports he has been in the emergency department about once a year for anaphylactic reaction. He has had to be admitted before but has not required intubation or advanced airway management. On my evaluation, the patient is complaining of shortness of breath and a feeling of itchiness and swelling in his throat. O2 sats are 98% on room air. Normotensive. On exam, he has mild posterior  oropharyngeal swelling. There is erythema around the site on his left lower extremity. Treated with IM epi, IV Solu-Medrol, Pepcid and Benadryl for allergic reaction, likely anaphylaxis. Reported improvement in symptoms on multiple re-evaluations. Patient was observed in the ED for 4 hours following epi administration and did not experience any recurrence of symptoms. On reevaluation the patient is breathing easily on room air, swelling has resolved and erythema of left lower extremity has resolved. Patient was discharged in stable condition. Anaphylaxis precautions given. Prescription for steroid taper given. Patient advised to take Benadryl as needed for itching. EpiPen prescription was provided as well. Pt reports he has two epi pens at home already. He'll follow-up with his primary care doctor as needed. He is in agreement with this plan.   This patient was seen and discussed with Dr. Clayborne Dana, ED attending    Isa Rankin, MD 09/09/15 1610  Marily Memos, MD 09/09/15 1759

## 2015-09-08 NOTE — ED Notes (Signed)
Pt now sob since getting to the room. MD notified and Dewayne HatchAnn Resident in room. Pt's throat swollen, pt still able to speak in complete sentences. Ann Resident gave verbal orders for solu medrol 125, benadryl 25mg , and pepcid IV, and an epipen

## 2015-09-09 MED ORDER — PREDNISONE 10 MG PO TABS: 10.0000 mg | ORAL_TABLET | Freq: Every day | ORAL | Status: DC

## 2015-09-09 MED ORDER — DIPHENHYDRAMINE HCL 25 MG PO TABS: 50.0000 mg | ORAL_TABLET | Freq: Four times a day (QID) | ORAL | Status: DC | PRN

## 2015-09-09 MED ORDER — EPINEPHRINE 0.3 MG/0.3ML IJ SOAJ: 0.3000 mg | Freq: Once | INTRAMUSCULAR | Status: DC

## 2015-09-09 NOTE — ED Notes (Addendum)
Pt verbalized understanding of d/c instructions and has no further questions. Pt stable and NAD. Airway intact, pt denies sob. No throat swelling noted.

## 2015-09-09 NOTE — Discharge Instructions (Signed)
Bee, Wasp, or [employer], wasps, and hornets are part of a family of insects that can sting people. These stings can cause pain and inflammation, but they are usually not serious. However, some people may have an allergic reaction to a sting. This can cause the symptoms to be more severe.  SYMPTOMS  Common symptoms of this condition include:   A red lump in the skin that sometimes has a tiny hole in the center. In some cases, a stinger may be in the center of the wound.  Pain and itching at the sting site.  Redness and swelling around the sting site. If you have an allergic reaction (localized allergic reaction), the swelling and redness may spread out from the sting site. In some cases, this reaction can continue to develop over the next 12-36 hours. In rare cases, a person may have a severe allergic reaction (anaphylactic reaction) to a sting. Symptoms of an anaphylactic reaction may include:   Wheezing or difficulty breathing.  Raised, itchy, red patches on the skin.  Nausea or vomiting.  Abdominal cramping.  Diarrhea.  Chest pain.  Fainting.  Redness of the face (flushing). DIAGNOSIS  This condition is usually diagnosed based on symptoms, medical history, and a physical exam. TREATMENT  Most stings can be treated with:   Icing to reduce swelling.  Medicines (antihistamines) to treat itching or an allergic reaction.  Medicines to help reduce pain. These may be medicines that you take by mouth, or medicated creams or lotions that you apply to your skin. If you were stung by a bee, the stinger and a small sac of poison may be in the wound. This may be removed by brushing across it with a flat card, such as a credit card. Another method is to pinch the area and pull it out. These methods can help reduce the severity of the body's reaction to the sting.  HOME CARE INSTRUCTIONS   Wash the sting site daily with soap and water as told by your health care provider.  Apply  or take over-the-counter and prescription medicines only as told by your health care provider.  If directed, apply ice to the sting area.  Put ice in a plastic bag.  Place a towel between your skin and the bag.  Leave the ice on for 20 minutes, 2-3 times per day.  Do not scratch the sting area.  To lessen pain, try using a paste that is made of water and baking soda. Rub the paste on the sting area and leave it on for 5 minutes.  If you had a severe allergic reaction to a sting, you may need:  To wear a medical bracelet or necklace that lists the allergy.  To learn when and how to use an anaphylaxis kit or epinephrine injection. Your family members may also need to learn this.  To carry an anaphylaxis kit with you at all times. SEEK MEDICAL CARE IF:   Your symptoms do not get better in 2-3 days.  You have redness, swelling, or pain that spreads beyond the area of the sting.  You have a fever. SEEK IMMEDIATE MEDICAL CARE IF:  You have symptoms of a severe allergic reaction. These include:   Wheezing or difficulty breathing.  Chest pain.  Light-headedness or fainting.  Itchy, raised, red patches on the skin.  Nausea or vomiting.  Abdominal cramping.  Diarrhea.   This information is not intended to replace advice given to you by your health care provider.  Make sure you discuss any questions you have with your health care provider. °  °Document Released: 03/26/2005 Document Revised: 12/15/2014 Document Reviewed: 08/11/2014 °Elsevier Interactive Patient Education ©2016 [employer] ° °

## 2015-09-23 ENCOUNTER — Other Ambulatory Visit: Payer: Self-pay | Admitting: *Deleted

## 2015-09-23 ENCOUNTER — Ambulatory Visit (INDEPENDENT_AMBULATORY_CARE_PROVIDER_SITE_OTHER): Payer: BLUE CROSS/BLUE SHIELD | Admitting: Internal Medicine

## 2015-09-23 ENCOUNTER — Encounter: Payer: Self-pay | Admitting: Internal Medicine

## 2015-09-23 VITALS — BP 120/76 | HR 80 | Temp 98.2°F | Ht 68.0 in | Wt 254.0 lb

## 2015-09-23 DIAGNOSIS — R7989 Other specified abnormal findings of blood chemistry: Secondary | ICD-10-CM

## 2015-09-23 DIAGNOSIS — I10 Essential (primary) hypertension: Secondary | ICD-10-CM

## 2015-09-23 DIAGNOSIS — Z0000 Encounter for general adult medical examination without abnormal findings: Secondary | ICD-10-CM

## 2015-09-23 MED ORDER — MECLIZINE HCL 25 MG PO TABS: 25.0000 mg | ORAL_TABLET | Freq: Three times a day (TID) | ORAL | Status: DC | PRN

## 2015-09-23 MED ORDER — EPINEPHRINE 0.3 MG/0.3ML IJ SOAJ: 0.3000 mg | Freq: Once | INTRAMUSCULAR | Status: DC

## 2015-09-23 NOTE — Patient Instructions (Signed)

## 2015-09-23 NOTE — Progress Notes (Signed)
Subjective:    Patient ID: David Vincent, male    DOB: 06/09/1950, 65 y.o.   MRN: [id number]  HPI  Wt Readings from Last 3 Encounters:  09/23/15 254 lb (115.214 kg)  09/08/15 265 lb (120.203 kg)  03/07/15 265 lb (120.203 kg)   BP Readings from Last 3 Encounters:  09/23/15 120/76  09/09/15 141/79  03/07/15 130/72     Subjective:    Patient ID: David Vincent, male    DOB: 08/23/1950, 65 y.o.   MRN: [id number]  HPI 65 -year-old patient who is seen today for a preventive health examination  Doing quite well.  No concerns or complaints.Medical problems include a history of obesity and treated hypertension.  Colonoscopy 2011  Nuclear stress test February 2013  He has a history of nonobstructive CAD noted on heart catheterization in 2003.  He was admitted in 2013 with chest pain thought GI in origin.  A stress Myoview was normal   Past Medical History  Diagnosis Date  . BACK PAIN 03/10/2007  . HYPERTENSION 11/15/2006  . INGUINAL HERNIA, RIGHT 12/05/2009  . LEG CRAMPS 12/03/2008  . SLEEP APNEA 11/16/2008  . VERTIGO, POSITIONAL 08/08/2009  . ED (erectile dysfunction)   . History of mumps orchitis   . GERD (gastroesophageal reflux disease)   . CAD (coronary artery disease)     A.  01/05/2002 Cath - nonobs dzs (LAD 20prox, RI 30-40 prox)  . Unstable angina [facility](HCC)     Social History   Social History  . Marital Status: Married    Spouse Name: N/A  . Number of Children: N/A  . Years of Education: N/A   Occupational History  . Not on file.   Social History Main Topics  . Smoking status: Never Smoker   . Smokeless tobacco: Never Used  . Alcohol Use: No  . Drug Use: No  . Sexual Activity: Not on file   Other Topics Concern  . Not on file   Social History Narrative   05/02/2011:  Currently unemployed.  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  Does not exercise or adhere to any specific diet.    Past Surgical History  Procedure Laterality Date  .  Cardiac catheterization    . Knee arthroscopy      Right    Family History  Problem Relation Age of Onset  . Heart attack Father     died 7790  . Pneumonia Mother     died 5888 - hip fx/pna  . Hypertension Sister     alive  . Cancer Brother     died - agent orange exposure  . Hypertension Brother     alive    Allergies  Allergen Reactions  . Bee Venom Anaphylaxis  . Codeine Phosphate Itching  . Penicillins Other (See Comments)    Has patient had a PCN reaction causing immediate rash, facial/tongue/throat swelling, SOB or lightheadedness with hypotension: Unknown, childhood reaction Has patient had a PCN reaction causing severe rash involving mucus membranes or skin necrosis: No Has patient had a PCN reaction that required hospitalization No Has patient had a PCN reaction occurring within the last 10 years: No If all of the above answers are "NO", then may proceed with Cephalosporin use.   . Prednisone Itching    Current Outpatient Prescriptions on File Prior to Visit  Medication Sig Dispense Refill  . acetaminophen (TYLENOL) 500 MG tablet Take 1,000 mg by mouth 3 (three) times  daily.    . atorvastatin (LIPITOR) 20 MG tablet Take 1 tablet (20 mg total) by mouth daily. 90 tablet 3  . EPINEPHrine 0.3 mg/0.3 mL IJ SOAJ injection Inject 0.3 mLs (0.3 mg total) into the muscle once. 1 Device 2  . losartan (COZAAR) 100 MG tablet Take 1 tablet (100 mg total) by mouth daily. 90 tablet 1  . meclizine (ANTIVERT) 25 MG tablet Take 1 tablet (25 mg total) by mouth 3 (three) times daily as needed. (Patient taking differently: Take 25 mg by mouth 3 (three) times daily as needed for dizziness. ) 30 tablet 0  . meloxicam (MOBIC) 15 MG tablet TAKE 1 TABLET BY MOUTH ONCE A DAY WITH FOOD FOR INFLAMMATION/PAIN  0  . omeprazole (PRILOSEC) 20 MG capsule Take 20 mg by mouth every morning.     No current facility-administered medications on file prior to visit.    BP 120/76 mmHg  Pulse 80  Temp(Src)  98.2 F (36.8 C) (Oral)  Ht 5\' 8"  (1.727 m)  Wt 254 lb (115.214 kg)  BMI 38.63 kg/m2       Review of Systems  Constitutional: Negative for fever, chills, activity change, appetite change and fatigue.  HENT: Negative for congestion, dental problem, ear pain, hearing loss, mouth sores, rhinorrhea, sinus pressure, sneezing, tinnitus, trouble swallowing and voice change.   Eyes: Negative for photophobia, pain, redness and visual disturbance.  Respiratory: Negative for apnea, cough, choking, chest tightness, shortness of breath and wheezing.   Cardiovascular: Negative for chest pain, palpitations and leg swelling.  Gastrointestinal: Negative for nausea, vomiting, abdominal pain, diarrhea, constipation, blood in stool, abdominal distention, anal bleeding and rectal pain.  Genitourinary: Positive for frequency. Negative for dysuria, urgency, hematuria, flank pain, decreased urine volume, discharge, penile swelling, scrotal swelling, difficulty urinating, genital sores and testicular pain.  Musculoskeletal: Negative for myalgias, back pain, joint swelling, arthralgias, gait problem, neck pain and neck stiffness.  Skin: Negative for color change, rash and wound.  Neurological: Negative for dizziness, tremors, seizures, syncope, facial asymmetry, speech difficulty, weakness, light-headedness, numbness and headaches.  Hematological: Negative for adenopathy. Does not bruise/bleed easily.  Psychiatric/Behavioral: Negative for suicidal ideas, hallucinations, behavioral problems, confusion, sleep disturbance, self-injury, dysphoric mood, decreased concentration and agitation. The patient is not nervous/anxious.        Objective:   Physical Exam  Constitutional: He appears well-developed and well-nourished.  HENT:  Head: Normocephalic and atraumatic.  Right Ear: External ear normal.  Left Ear: External ear normal.  Nose: Nose normal.  Mouth/Throat: Oropharynx is clear and moist.  Eyes:  Conjunctivae and EOM are normal. Pupils are equal, round, and reactive to light. No scleral icterus.  Neck: Normal range of motion. Neck supple. No JVD present. No thyromegaly present.  Cardiovascular: Regular rhythm, normal heart sounds and intact distal pulses.  Exam reveals no gallop and no friction rub.   No murmur heard. Pulmonary/Chest: Effort normal and breath sounds normal. He exhibits no tenderness.  Abdominal: Soft. Bowel sounds are normal. He exhibits no distension and no mass. There is no tenderness.  Genitourinary: Prostate normal and penis normal. Guaiac negative stool.  Musculoskeletal: Normal range of motion. He exhibits no edema or tenderness.  Lymphadenopathy:    He has no cervical adenopathy.  Neurological: He is alert. He has normal reflexes. No cranial nerve deficit. Coordination normal.  Skin: Skin is warm and dry. No rash noted.  Psychiatric: He has a normal mood and affect. His behavior is normal.  Assessment & Plan:   Preventive health care Hypertension, controlled Mild BPH History of nonobstructive coronary artery disease.  Will start on low intensity statin therapy Obesity.  Weight loss encouraged Back pain, stable  Recheck 6 months  Review of Systems    has above Objective:   Physical Exam   As above     Assessment & Plan:   As above   Rogelia Boga, MD

## 2015-11-17 ENCOUNTER — Emergency Department (HOSPITAL_COMMUNITY)
Admission: EM | Admit: 2015-11-17 | Discharge: 2015-11-17 | Disposition: A | Discharge status: Home / Self Care | Payer: Worker's Compensation | Attending: Emergency Medicine | Admitting: Emergency Medicine

## 2015-11-17 ENCOUNTER — Encounter (HOSPITAL_COMMUNITY): Payer: Self-pay

## 2015-11-17 DIAGNOSIS — I10 Essential (primary) hypertension: Secondary | ICD-10-CM | POA: Diagnosis not present

## 2015-11-17 DIAGNOSIS — T63441A Toxic effect of venom of bees, accidental (unintentional), initial encounter: Secondary | ICD-10-CM | POA: Insufficient documentation

## 2015-11-17 DIAGNOSIS — I2510 Atherosclerotic heart disease of native coronary artery without angina pectoris: Secondary | ICD-10-CM | POA: Insufficient documentation

## 2015-11-17 DIAGNOSIS — Z79899 Other long term (current) drug therapy: Secondary | ICD-10-CM | POA: Insufficient documentation

## 2015-11-17 DIAGNOSIS — Y990 Civilian activity done for income or pay: Secondary | ICD-10-CM | POA: Diagnosis not present

## 2015-11-17 DIAGNOSIS — Y9289 Other specified places as the place of occurrence of the external cause: Secondary | ICD-10-CM | POA: Insufficient documentation

## 2015-11-17 DIAGNOSIS — Y939 Activity, unspecified: Secondary | ICD-10-CM | POA: Diagnosis not present

## 2015-11-17 DIAGNOSIS — T63444A Toxic effect of venom of bees, undetermined, initial encounter: Secondary | ICD-10-CM

## 2015-11-17 MED ORDER — PREDNISONE 20 MG PO TABS: 40.0000 mg | ORAL_TABLET | Freq: Every day | ORAL | 0 refills | Status: DC

## 2015-11-17 MED ORDER — FAMOTIDINE 20 MG PO TABS: 20.0000 mg | ORAL_TABLET | Freq: Two times a day (BID) | ORAL | 0 refills | Status: DC

## 2015-11-17 MED ORDER — METHYLPREDNISOLONE SODIUM SUCC 125 MG IJ SOLR
125.0000 mg | Freq: Once | INTRAMUSCULAR | Status: AC
  Administered 2015-11-17: 125 mg via INTRAVENOUS
  Filled 2015-11-17: qty 2

## 2015-11-17 MED ORDER — FAMOTIDINE IN NACL 20-0.9 MG/50ML-% IV SOLN
20.0000 mg | Freq: Once | INTRAVENOUS | Status: AC
  Administered 2015-11-17: 20 mg via INTRAVENOUS
  Filled 2015-11-17: qty 50

## 2015-11-17 MED ORDER — DIPHENHYDRAMINE HCL 50 MG/ML IJ SOLN
25.0000 mg | Freq: Once | INTRAMUSCULAR | Status: AC
  Administered 2015-11-17: 25 mg via INTRAVENOUS
  Filled 2015-11-17: qty 1

## 2015-11-17 MED ORDER — EPINEPHRINE 0.3 MG/0.3ML IJ SOAJ: 0.3000 mg | Freq: Once | INTRAMUSCULAR | 0 refills | Status: AC

## 2015-11-17 MED ORDER — DIPHENHYDRAMINE HCL 25 MG PO TABS: 25.0000 mg | ORAL_TABLET | Freq: Four times a day (QID) | ORAL | 0 refills | Status: DC

## 2015-11-17 MED ORDER — SODIUM CHLORIDE 0.9 % IV BOLUS (SEPSIS)
1000.0000 mL | Freq: Once | INTRAVENOUS | Status: AC
  Administered 2015-11-17: 1000 mL via INTRAVENOUS

## 2015-11-17 NOTE — ED Provider Notes (Signed)
[facility]-EMERGENCY DEPT Provider Note   CSN: [id number] Arrival date & time: 11/17/15  1032  First Provider Contact:  First MD Initiated Contact with Patient 11/17/15 1100      History   Chief Complaint Allergic reaction, bee sting  HPI   David Vincent is an 65 y.o. male with history of anaphylaxis to bee stings who presents to the ED for evaluation after a bee sting. He states he was at work when he got stung on the dorsum of his right hand by a bee around 10 AM. He states he knows he is anaphylactic so he immediately went home and used his epi pen about fifteen minutes later then came straight to the ED. He states that he came to the ED "just in case" because he was stung by a bee about two months ago and started feeling throat swelling and difficulty breathing even after using his epi pen. In the ED now he states he has some pain in his hand but denies any other symptoms. Denies rash. Denies throat, lip, or tongue swelling. Denies difficulty breathing. Denies itchiness anywhere.   Past Medical History:  Diagnosis Date  . BACK PAIN 03/10/2007  . CAD (coronary artery disease)    A.  01/05/2002 Cath - nonobs dzs (LAD 20prox, RI 30-40 prox)  . ED (erectile dysfunction)   . GERD (gastroesophageal reflux disease)   . History of mumps orchitis   . HYPERTENSION 11/15/2006  . INGUINAL HERNIA, RIGHT 12/05/2009  . LEG CRAMPS 12/03/2008  . SLEEP APNEA 11/16/2008  . Unstable angina (HCC)   . VERTIGO, POSITIONAL 08/08/2009    Patient Active Problem List   Diagnosis Date Noted  . Low testosterone 08/24/2014  . Erectile dysfunction due to diseases classified elsewhere 08/24/2014  . Acute sinus infection 04/04/2013  . Fall from ladder 01/01/2012  . Concussion 01/01/2012  . Multiple fractures of ribs of left side 01/01/2012  . GERD (gastroesophageal reflux disease) 05/03/2011  . CAD (coronary artery disease)   . Unstable angina (HCC)   . INGUINAL HERNIA, RIGHT 12/05/2009  . VERTIGO, POSITIONAL  08/08/2009  . Sleep apnea 11/16/2008  . Backache 03/10/2007  . Essential hypertension 11/15/2006    Past Surgical History:  Procedure Laterality Date  . CARDIAC CATHETERIZATION    . KNEE ARTHROSCOPY     Right       Home Medications    Prior to Admission medications   Medication Sig Start Date End Date Taking? Authorizing Provider  acetaminophen (TYLENOL) 500 MG tablet Take 1,000 mg by mouth 3 (three) times daily.    Historical Provider, MD  atorvastatin (LIPITOR) 20 MG tablet Take 1 tablet (20 mg total) by mouth daily. 05/11/15   Gordy Savers, MD  EPINEPHrine 0.3 mg/0.3 mL IJ SOAJ injection Inject 0.3 mLs (0.3 mg total) into the muscle once. 09/23/15   Gordy Savers, MD  losartan (COZAAR) 100 MG tablet Take 1 tablet (100 mg total) by mouth daily. 07/13/15   Gordy Savers, MD  meclizine (ANTIVERT) 25 MG tablet Take 1 tablet (25 mg total) by mouth 3 (three) times daily as needed. 09/23/15   Gordy Savers, MD  meloxicam (MOBIC) 15 MG tablet TAKE 1 TABLET BY MOUTH ONCE A DAY WITH FOOD FOR INFLAMMATION/PAIN 02/28/15   Historical Provider, MD  omeprazole (PRILOSEC) 20 MG capsule Take 20 mg by mouth every morning.    Historical Provider, MD    Family History Family History  Problem Relation Age of Onset  .  Pneumonia Mother     died 55 - hip fx/pna  . Heart attack Father     died 87  . Hypertension Sister     alive  . Cancer Brother     died - agent orange exposure  . Hypertension Brother     alive    Social History Social History  Substance Use Topics  . Smoking status: Never Smoker  . Smokeless tobacco: Never Used  . Alcohol use No     Allergies   Bee venom; Codeine phosphate; Penicillins; and Prednisone   Review of Systems Review of Systems 10 Systems reviewed and are negative for acute change except as noted in the HPI.   Physical Exam Updated Vital Signs BP 152/74 (BP Location: Left Arm)   Pulse 92   Temp 97.9 F (36.6 C) (Oral)    Resp 19   Ht  (1.727 m)   Wt 111.6 kg   SpO2 94%   BMI 37.40 kg/m   Physical Exam  Constitutional: He is oriented to person, place, and time. No distress.  HENT:  Head: Atraumatic.  Right Ear: External ear normal.  Left Ear: External ear normal.  Nose: Nose normal.  Mouth/Throat: Oropharynx is clear and moist.  No posterior oropharyngeal edema No intraoral lesions  Eyes: Conjunctivae are normal. No scleral icterus.  Neck: Normal range of motion. Neck supple.  Cardiovascular: Normal rate, regular rhythm and normal heart sounds.   Pulmonary/Chest: Effort normal and breath sounds normal. No stridor. No respiratory distress. He has no wheezes.  Abdominal: He exhibits no distension.  Neurological: He is alert and oriented to person, place, and time.  Skin: Skin is warm and dry. Capillary refill takes less than 2 seconds. No rash noted. He is not diaphoretic. No pallor.  Insect bite dorsal right hand near knuckles. No edema or surrounding erythema. Mildly tender to palpation.  Psychiatric: He has a normal mood and affect. His behavior is normal.  Nursing note and vitals reviewed.    ED Treatments / Results  Labs (all labs ordered are listed, but only abnormal results are displayed) Labs Reviewed - No data to display  EKG  EKG Interpretation None       Radiology No results found.  Procedures Procedures (including critical care time)  Medications Ordered in ED Medications  famotidine (PEPCID) IVPB 20 mg premix (20 mg Intravenous New Bag/Given 11/17/15 1238)  methylPREDNISolone sodium succinate (SOLU-MEDROL) 125 mg/2 mL injection 125 mg (125 mg Intravenous Given 11/17/15 1238)  diphenhydrAMINE (BENADRYL) injection 25 mg (25 mg Intravenous Given 11/17/15 1238)  sodium chloride 0.9 % bolus 1,000 mL (1,000 mLs Intravenous New Bag/Given 11/17/15 1238)     Initial Impression / Assessment and Plan / ED Course  I have reviewed the triage vital signs and the nursing  notes.  Pertinent labs & imaging results that were available during my care of the patient were reviewed by me and considered in my medical decision making (see chart for details).  Clinical Course    Pt nontoxic appearing and VS unremarakble. No signs of airway or systemic reaction at this time. However, given history of symptoms returning after epipen use we will give fluids, solu-medrol, benadryl, and pepcid in the ED and plan to watch for several hours.   2:39 PM  Pt remains asymptomatic and hemodynamically stable. Will d/c home. Rx given for few more days of steroids, benadryl, and Pepcid. Rx also given for replacement Epi pen. ER return precautions given.   Final  Clinical Impressions(s) / ED Diagnoses   Final diagnoses:  Bee sting, undetermined intent, initial encounter    New Prescriptions New Prescriptions   DIPHENHYDRAMINE (BENADRYL) 25 MG TABLET    Take 1 tablet (25 mg total) by mouth every 6 (six) hours.   EPINEPHRINE 0.3 MG/0.3 ML IJ SOAJ INJECTION    Inject 0.3 mLs (0.3 mg total) into the muscle once.   FAMOTIDINE (PEPCID) 20 MG TABLET    Take 1 tablet (20 mg total) by mouth 2 (two) times daily.   PREDNISONE (DELTASONE) 20 MG TABLET    Take 2 tablets (40 mg total) by mouth daily.     Carlene CoriaSerena Y Jeffifer Rabold, PA-C 11/17/15 1439    Maia PlanJoshua G Long, MD 11/17/15 [phone]

## 2015-11-17 NOTE — ED Triage Notes (Signed)
patient here after being stung by bee to right hand and then using epi pen. Because he has history of allergic reaction to bee stings. No redness, no shortness of breath, no distress

## 2015-11-17 NOTE — Discharge Instructions (Signed)
Take the oral medications as prescribed. Please fill the prescription for the epi-pen and bring it with you everywhere you go. Follow up with your primary care provider next week. Return to the ER for new or worsening symptoms.

## 2016-02-09 ENCOUNTER — Other Ambulatory Visit: Payer: Self-pay | Admitting: Internal Medicine

## 2016-03-23 ENCOUNTER — Ambulatory Visit: Payer: Self-pay | Admitting: Internal Medicine

## 2016-04-16 ENCOUNTER — Encounter: Payer: Self-pay | Admitting: Internal Medicine

## 2016-04-16 ENCOUNTER — Ambulatory Visit (INDEPENDENT_AMBULATORY_CARE_PROVIDER_SITE_OTHER): Payer: PPO | Admitting: Internal Medicine

## 2016-04-16 VITALS — BP 150/78 | HR 78 | Temp 97.9°F | Ht 68.0 in | Wt 260.0 lb

## 2016-04-16 DIAGNOSIS — I10 Essential (primary) hypertension: Secondary | ICD-10-CM | POA: Diagnosis not present

## 2016-04-16 DIAGNOSIS — Z23 Encounter for immunization: Secondary | ICD-10-CM | POA: Diagnosis not present

## 2016-04-16 DIAGNOSIS — I2510 Atherosclerotic heart disease of native coronary artery without angina pectoris: Secondary | ICD-10-CM | POA: Diagnosis not present

## 2016-04-16 NOTE — Patient Instructions (Signed)

## 2016-04-16 NOTE — Progress Notes (Signed)
Pre visit review using our clinic review tool, if applicable. No additional management support is needed unless otherwise documented below in the visit note. 

## 2016-04-16 NOTE — Progress Notes (Signed)
Subjective:    Patient ID: David Vincent, male    DOB: Mar 17, 1951, 66 y.o.   MRN: [id number]  HPI 66 year old patient who is seen today for his six-month follow-up.  He has essential hypertension.  He has a history of coronary artery disease.  Doing quite well.  No major concerns or complaints.  Denies any ischemic chest pain. Remains on statin therapy.  Past Medical History:  Diagnosis Date  . BACK PAIN 03/10/2007  . CAD (coronary artery disease)    A.  01/05/2002 Cath - nonobs dzs (LAD 20prox, RI 30-40 prox)  . ED (erectile dysfunction)   . GERD (gastroesophageal reflux disease)   . History of mumps orchitis   . HYPERTENSION 11/15/2006  . INGUINAL HERNIA, RIGHT 12/05/2009  . LEG CRAMPS 12/03/2008  . SLEEP APNEA 11/16/2008  . Unstable angina (HCC)   . VERTIGO, POSITIONAL 08/08/2009     Social History   Social History  . Marital status: Married    Spouse name: N/A  . Number of children: N/A  . Years of education: N/A   Occupational History  . Not on file.   Social History Main Topics  . Smoking status: Never Smoker  . Smokeless tobacco: Never Used  . Alcohol use No  . Drug use: No  . Sexual activity: Not on file   Other Topics Concern  . Not on file   Social History Narrative   05/02/2011:  Currently unemployed.  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  Does not exercise or adhere to any specific diet.    Past Surgical History:  Procedure Laterality Date  . CARDIAC CATHETERIZATION    . KNEE ARTHROSCOPY     Right    Family History  Problem Relation Age of Onset  . Pneumonia Mother     died 20 - hip fx/pna  . Heart attack Father     died 90  . Hypertension Sister     alive  . Cancer Brother     died - agent orange exposure  . Hypertension Brother     alive    Allergies  Allergen Reactions  . Bee Venom Anaphylaxis  . Codeine Phosphate Itching  . Penicillins Other (See Comments)    Has patient had a PCN reaction causing immediate rash,  facial/tongue/throat swelling, SOB or lightheadedness with hypotension: Unknown, childhood reaction Has patient had a PCN reaction causing severe rash involving mucus membranes or skin necrosis: No Has patient had a PCN reaction that required hospitalization No Has patient had a PCN reaction occurring within the last 10 years: No If all of the above answers are "NO", then may proceed with Cephalosporin use.   . Prednisone Itching    Current Outpatient Prescriptions on File Prior to Visit  Medication Sig Dispense Refill  . acetaminophen (TYLENOL) 500 MG tablet Take 1,000 mg by mouth 3 (three) times daily.    Marland Kitchen atorvastatin (LIPITOR) 20 MG tablet Take 1 tablet (20 mg total) by mouth daily. 90 tablet 3  . EPINEPHrine 0.3 mg/0.3 mL IJ SOAJ injection Inject 0.3 mLs (0.3 mg total) into the muscle once. 1 Device 2  . famotidine (PEPCID) 20 MG tablet Take 1 tablet (20 mg total) by mouth 2 (two) times daily. 10 tablet 0  . losartan (COZAAR) 100 MG tablet take 1 tablet by mouth once daily 90 tablet 1  . meloxicam (MOBIC) 15 MG tablet TAKE 1 TABLET BY MOUTH ONCE A DAY WITH FOOD  FOR INFLAMMATION/PAIN  0  . omeprazole (PRILOSEC) 20 MG capsule Take 20 mg by mouth every morning.     No current facility-administered medications on file prior to visit.     BP (!) 150/78 (BP Location: Right Arm, Patient Position: Sitting, Cuff Size: Normal)   Pulse 78   Temp 97.9 F (36.6 C) (Oral)   Ht 5\' 8"  (1.727 m)   Wt 260 lb (117.9 kg)   SpO2 98%   BMI 39.53 kg/m      Review of Systems  Constitutional: Negative for appetite change, chills, fatigue and fever.  HENT: Negative for congestion, dental problem, ear pain, hearing loss, sore throat, tinnitus, trouble swallowing and voice change.   Eyes: Negative for pain, discharge and visual disturbance.  Respiratory: Negative for cough, chest tightness, wheezing and stridor.   Cardiovascular: Negative for chest pain, palpitations and leg swelling.    Gastrointestinal: Negative for abdominal distention, abdominal pain, blood in stool, constipation, diarrhea, nausea and vomiting.  Genitourinary: Negative for difficulty urinating, discharge, flank pain, genital sores, hematuria and urgency.  Musculoskeletal: Negative for arthralgias, back pain, gait problem, joint swelling, myalgias and neck stiffness.  Skin: Negative for rash.  Neurological: Negative for dizziness, syncope, speech difficulty, weakness, numbness and headaches.  Hematological: Negative for adenopathy. Does not bruise/bleed easily.  Psychiatric/Behavioral: Negative for behavioral problems and dysphoric mood. The patient is not nervous/anxious.        Objective:   Physical Exam  Constitutional: He is oriented to person, place, and time. He appears well-developed.  Weight 260 Repeat blood pressure 136/76  HENT:  Head: Normocephalic.  Right Ear: External ear normal.  Left Ear: External ear normal.  Eyes: Conjunctivae and EOM are normal.  Neck: Normal range of motion.  Cardiovascular: Normal rate and normal heart sounds.   Pulmonary/Chest: Breath sounds normal.  Abdominal: Bowel sounds are normal.  Musculoskeletal: Normal range of motion. He exhibits no edema or tenderness.  Neurological: He is alert and oriented to person, place, and time.  Psychiatric: He has a normal mood and affect. His behavior is normal.          Assessment & Plan:   Hypertension-stable.  No change in therapy obesity.  Weight loss encouraged Coronary artery disease.  Continue upper to sit risk factor modification.  Weight loss exercise encouraged  No change in medical therapy CPX 6 months  David Vincent,David Vincent

## 2016-07-30 ENCOUNTER — Other Ambulatory Visit: Payer: Self-pay | Admitting: Internal Medicine

## 2016-08-29 ENCOUNTER — Other Ambulatory Visit: Payer: Self-pay | Admitting: Internal Medicine

## 2016-09-18 ENCOUNTER — Other Ambulatory Visit: Payer: Self-pay

## 2016-09-25 ENCOUNTER — Encounter: Payer: Self-pay | Admitting: Internal Medicine

## 2016-09-25 ENCOUNTER — Ambulatory Visit (INDEPENDENT_AMBULATORY_CARE_PROVIDER_SITE_OTHER): Payer: PPO | Admitting: Internal Medicine

## 2016-09-25 VITALS — BP 128/74 | HR 72 | Temp 98.3°F | Ht 70.0 in | Wt 265.0 lb

## 2016-09-25 DIAGNOSIS — Z0000 Encounter for general adult medical examination without abnormal findings: Secondary | ICD-10-CM

## 2016-09-25 DIAGNOSIS — I10 Essential (primary) hypertension: Secondary | ICD-10-CM | POA: Diagnosis not present

## 2016-09-25 DIAGNOSIS — I2510 Atherosclerotic heart disease of native coronary artery without angina pectoris: Secondary | ICD-10-CM

## 2016-09-25 DIAGNOSIS — M79671 Pain in right foot: Secondary | ICD-10-CM | POA: Diagnosis not present

## 2016-09-25 LAB — LIPID PANEL
CHOL/HDL RATIO: 3
Cholesterol: 120 mg/dL (ref 0–200)
HDL: 38.4 mg/dL — AB (ref >39.00)
LDL Cholesterol: 56 mg/dL (ref 0–99)
NONHDL: 82.08
TRIGLYCERIDES: 129 mg/dL (ref 0.0–149.0)
VLDL: 25.8 mg/dL (ref 0.0–40.0)

## 2016-09-25 LAB — CBC WITH DIFFERENTIAL/PLATELET
BASOS ABS: 0.1 10*3/uL (ref 0.0–0.1)
Basophils Relative: 1.1 % (ref 0.0–3.0)
EOS ABS: 0.2 10*3/uL (ref 0.0–0.7)
Eosinophils Relative: 3 % (ref 0.0–5.0)
HEMATOCRIT: 41 % (ref 39.0–52.0)
HEMOGLOBIN: 14.1 g/dL (ref 13.0–17.0)
LYMPHS PCT: 33.3 % (ref 12.0–46.0)
Lymphs Abs: 2.8 10*3/uL (ref 0.7–4.0)
MCHC: 34.2 g/dL (ref 30.0–36.0)
MCV: 93.1 fl (ref 78.0–100.0)
MONOS PCT: 7.3 % (ref 3.0–12.0)
Monocytes Absolute: 0.6 10*3/uL (ref 0.1–1.0)
Neutro Abs: 4.6 10*3/uL (ref 1.4–7.7)
Neutrophils Relative %: 55.3 % (ref 43.0–77.0)
Platelets: 262 10*3/uL (ref 150.0–400.0)
RBC: 4.41 Mil/uL (ref 4.22–5.81)
RDW: 14.2 % (ref 11.5–15.5)
WBC: 8.3 10*3/uL (ref 4.0–10.5)

## 2016-09-25 LAB — COMPREHENSIVE METABOLIC PANEL
ALK PHOS: 91 U/L (ref 39–117)
ALT: 15 U/L (ref 0–53)
AST: 19 U/L (ref 0–37)
Albumin: 4.3 g/dL (ref 3.5–5.2)
BUN: 16 mg/dL (ref 6–23)
CALCIUM: 9.5 mg/dL (ref 8.4–10.5)
CO2: 27 mEq/L (ref 19–32)
Chloride: 106 mEq/L (ref 96–112)
Creatinine, Ser: 0.87 mg/dL (ref 0.40–1.50)
GFR: 93.37 mL/min (ref >60.00)
GLUCOSE: 94 mg/dL (ref 70–99)
POTASSIUM: 4.1 meq/L (ref 3.5–5.1)
Sodium: 141 mEq/L (ref 135–145)
TOTAL PROTEIN: 6.7 g/dL (ref 6.0–8.3)
Total Bilirubin: 0.6 mg/dL (ref 0.2–1.2)

## 2016-09-25 LAB — PSA: PSA: 0.45 ng/mL (ref 0.10–4.00)

## 2016-09-25 LAB — TSH: TSH: 0.77 u[IU]/mL (ref 0.35–4.50)

## 2016-09-25 NOTE — Progress Notes (Signed)
Subjective:    Patient ID: David Vincent, male    DOB: 08/26/1950, [age] y.oMartha Vincent.   MRN: [id number]  HPI  66 year old patient who is seen today for a preventive health examination and initial Medicare wellness visit .  He has essential hypertension which has been well-controlled.  He has dyslipidemia.  In 2013, he was admitted for evaluation of chest pain.  In 2003 heart catheterization revealed nonobstructive coronary artery disease.  It was felt his chest pain was of GI origin.  Subsequent nuclear stress testing was normal  Past Medical History:  Diagnosis Date  . BACK PAIN 03/10/2007  . CAD (coronary artery disease)    A.  01/05/2002 Cath - nonobs dzs (LAD 20prox, RI 30-40 prox)  . ED (erectile dysfunction)   . GERD (gastroesophageal reflux disease)   . History of mumps orchitis   . HYPERTENSION 11/15/2006  . INGUINAL HERNIA, RIGHT 12/05/2009  . LEG CRAMPS 12/03/2008  . SLEEP APNEA 11/16/2008  . Unstable angina (HCC)   . VERTIGO, POSITIONAL 08/08/2009     Social History   Social History  . Marital status: Married    Spouse name: N/A  . Number of children: N/A  . Years of education: N/A   Occupational History  . Not on file.   Social History Main Topics  . Smoking status: Never Smoker  . Smokeless tobacco: Never Used  . Alcohol use No  . Drug use: No  . Sexual activity: Not on file   Other Topics Concern  . Not on file   Social History Narrative   05/02/2011:  Currently unemployed.  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  Does not exercise or adhere to any specific diet.    Past Surgical History:  Procedure Laterality Date  . CARDIAC CATHETERIZATION    . KNEE ARTHROSCOPY     Right    Family History  Problem Relation Age of Onset  . Pneumonia Mother        died 8788 - hip fx/pna  . Heart attack Father        died 2590  . Hypertension Sister        alive  . Cancer Brother        died - agent orange exposure  . Hypertension Brother        alive     Allergies  Allergen Reactions  . Bee Venom Anaphylaxis  . Codeine Phosphate Itching  . Penicillins Other (See Comments)    Has patient had a PCN reaction causing immediate rash, facial/tongue/throat swelling, SOB or lightheadedness with hypotension: Unknown, childhood reaction Has patient had a PCN reaction causing severe rash involving mucus membranes or skin necrosis: No Has patient had a PCN reaction that required hospitalization No Has patient had a PCN reaction occurring within the last 10 years: No If all of the above answers are "NO", then may proceed with Cephalosporin use.   . Prednisone Itching    Current Outpatient Prescriptions on File Prior to Visit  Medication Sig Dispense Refill  . acetaminophen (TYLENOL) 500 MG tablet Take 1,000 mg by mouth 3 (three) times daily.    Marland Kitchen. atorvastatin (LIPITOR) 20 MG tablet take 1 tablet by mouth once daily 90 tablet 3  . EPINEPHrine 0.3 mg/0.3 mL IJ SOAJ injection Inject 0.3 mLs (0.3 mg total) into the muscle once. 1 Device 2  . losartan (COZAAR) 100 MG tablet take 1 tablet by mouth once daily 90 tablet 1  .  omeprazole (PRILOSEC) 20 MG capsule Take 20 mg by mouth every morning.    . meloxicam (MOBIC) 15 MG tablet TAKE 1 TABLET BY MOUTH ONCE A DAY WITH FOOD FOR INFLAMMATION/PAIN  0   No current facility-administered medications on file prior to visit.     BP 128/74 (BP Location: Left Arm, Patient Position: Sitting, Cuff Size: Normal)   Pulse 72   Temp 98.3 F (36.8 C) (Oral)   Ht 5\' 10"  (1.778 m)   Wt 265 lb (120.2 kg)   SpO2 96%   BMI 38.02 kg/m   Initial Medicare wellness visit  1. Risk factors, based on past  M,S,F history.  Patient has a history of nonobstructive coronary artery disease.  Cardio vascular risk factors include hypertension and dyslipidemia  2.  Physical activities:remains quite active with out exercise restrictions  3.  Depression/mood:no history of major depression or mood disorder  4.  Hearing:no  deficits  5.  ADL's:independent  6.  Fall risk:low  7.  Home safety:no problems identified  8.  Height weight, and visual acuity;height and weight stable.  There is been 11 pounds of weight gain over the past year no change in visual acuity  9.  Counseling:more rigorous exercise modest weight loss.  All encouraged  10. Lab orders based on risk factors:laboratory update including lipid profile will be reviewed  11. Referral :not appropriate at this time  12. Care plan:continue efforts at aggressive risk factor modification  13. Cognitive assessment: alert in order with normal affect.  No cognitive dysfunction  14. Screening: Patient provided with a written and personalized 5-10 year screening schedule in the AVS.    15. Provider List Update: primary care ophthalmology and GI.  Less colonoscopy 2011  16.  Advanced directive.  No living will or health care power of attorney.  Discussed and encouraged to proceed with completing advanced directives   Review of Systems  Constitutional: Negative for appetite change, chills, fatigue and fever.  HENT: Negative for congestion, dental problem, ear pain, hearing loss, sore throat, tinnitus, trouble swallowing and voice change.   Eyes: Negative for pain, discharge and visual disturbance.  Respiratory: Negative for cough, chest tightness, wheezing and stridor.   Cardiovascular: Negative for chest pain, palpitations and leg swelling.  Gastrointestinal: Negative for abdominal distention, abdominal pain, blood in stool, constipation, diarrhea, nausea and vomiting.  Genitourinary: Negative for difficulty urinating, discharge, flank pain, genital sores, hematuria and urgency.  Musculoskeletal: Negative for arthralgias, back pain, gait problem, joint swelling, myalgias and neck stiffness.  Skin: Negative for rash.  Neurological: Negative for dizziness, syncope, speech difficulty, weakness, numbness and headaches.  Hematological: Negative for  adenopathy. Does not bruise/bleed easily.  Psychiatric/Behavioral: Negative for behavioral problems and dysphoric mood. The patient is not nervous/anxious.        Objective:   Physical Exam  Constitutional: He is oriented to person, place, and time. He appears well-developed.  HENT:  Head: Normocephalic.  Right Ear: External ear normal.  Left Ear: External ear normal.  Eyes: Conjunctivae and EOM are normal.  Neck: Normal range of motion.  Cardiovascular: Normal rate and normal heart sounds.   Diminished right dorsalis pedis pulse  Pulmonary/Chest: Breath sounds normal.  Abdominal: Bowel sounds are normal.  Genitourinary: Rectal exam shows guaiac negative stool.  Genitourinary Comments: Prostate plus 2 smooth and symmetrical  Musculoskeletal: Normal range of motion. He exhibits no edema or tenderness.  Neurological: He is alert and oriented to person, place, and time.  Psychiatric: He has a  normal mood and affect. His behavior is normal.          Assessment & Plan:   Preventive health examination Initial Medicare wellness visit Essential hypertension, well-controlled Dyslipidemia.  Continue statin therapy.  Review lipid profile Nonobstructive coronary artery disease.  Remains asymptomatic.  Continue efforts at aggressive risk factor modification Exogenous obesity.  Weight loss encouraged Right foot pain.  Will set up for podiatry evaluation  Follow-up 6 months Review lab All medications updated  Rogelia Boga

## 2016-09-25 NOTE — Patient Instructions (Addendum)
WE NOW OFFER   [facility] FAST TRACK!!!  SAME DAY Appointments for ACUTE CARE  Such as: Sprains, Injuries, cuts, abrasions, rashes, muscle pain, joint pain, back pain Colds, flu, sore throats, headache, allergies, cough, fever  Ear pain, sinus and eye infections Abdominal pain, nausea, vomiting, diarrhea, upset stomach Animal/insect bites  3 Easy Ways to Schedule: Walk-In Scheduling Call in scheduling Mychart Sign-up: [email or web address]   Limit your sodium (Salt) intake    It is important that you exercise regularly, at least 20 minutes 3 to 4 times per week.  If you develop chest pain or shortness of breath seek  medical attention.  You need to lose weight.  Consider a lower calorie diet and regular exercise.      

## 2016-09-26 ENCOUNTER — Ambulatory Visit: Payer: Self-pay

## 2016-10-17 ENCOUNTER — Ambulatory Visit (INDEPENDENT_AMBULATORY_CARE_PROVIDER_SITE_OTHER): Payer: PPO | Admitting: Podiatry

## 2016-10-17 ENCOUNTER — Encounter: Payer: Self-pay | Admitting: Podiatry

## 2016-10-17 ENCOUNTER — Ambulatory Visit (INDEPENDENT_AMBULATORY_CARE_PROVIDER_SITE_OTHER): Payer: PPO

## 2016-10-17 VITALS — BP 127/79

## 2016-10-17 DIAGNOSIS — M722 Plantar fascial fibromatosis: Secondary | ICD-10-CM | POA: Diagnosis not present

## 2016-10-17 DIAGNOSIS — M76821 Posterior tibial tendinitis, right leg: Secondary | ICD-10-CM | POA: Diagnosis not present

## 2016-10-17 MED ORDER — TRIAMCINOLONE ACETONIDE 10 MG/ML IJ SUSP
10.0000 mg | Freq: Once | INTRAMUSCULAR | Status: AC
  Administered 2016-10-17: 10 mg

## 2016-10-17 NOTE — Progress Notes (Signed)
   Subjective:    Patient ID: David Vincent, male    DOB: 06/04/1950, 66 y.o.   MRN: [id number]  HPI  Chief Complaint  Patient presents with  . Plantar Fasciitis     right foot pain/ feels like something pulling when stepping down/hx of plantar fascitis     Review of Systems  Musculoskeletal: Positive for gait problem.       Objective:   Physical Exam        Assessment & Plan:

## 2016-10-17 NOTE — Progress Notes (Signed)
Subjective:    Patient ID: David Vincent, male   DOB: 66 y.o.   MRN: [id number]   HPI patient presents stating he has a decent amount of discomfort in the outside of the right foot and states it's worse after periods of ambulation. States he feels like his arch has collapsed    Review of Systems  All other systems reviewed and are negative.       Objective:  Physical Exam  Cardiovascular: Intact distal pulses.   Musculoskeletal: Normal range of motion.  Neurological: He is alert.  Skin: Skin is warm.  Nursing note and vitals reviewed.  neurovascular status found to be intact muscle strength adequate range of motion within normal limits with patient noted to have moderate depression of the arch right with inflammation fluid around the tendon complex and also mild discomfort plantar aspect heel. 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 I explained importance of lifting the arch and I did apply a fascial brace to lift up the medial arch and I then went ahead and injected the tendon complex sheath 3 mg Kenalog 5 mg Xylocaine near the insertion. I reviewed his x-rays and he'll be seen back again in 3 weeks and may require orthotics or more extensive treatment but hopefully will respond to simple fascial brace usage  X-rays indicate moderate collapse medial longitudinal arch right with no indication of advanced arthritis or stress fracture

## 2016-11-14 ENCOUNTER — Ambulatory Visit (INDEPENDENT_AMBULATORY_CARE_PROVIDER_SITE_OTHER): Payer: PPO | Admitting: Podiatry

## 2016-11-14 DIAGNOSIS — M722 Plantar fascial fibromatosis: Secondary | ICD-10-CM

## 2016-11-14 DIAGNOSIS — M76821 Posterior tibial tendinitis, right leg: Secondary | ICD-10-CM

## 2016-11-15 ENCOUNTER — Telehealth: Payer: Self-pay | Admitting: *Deleted

## 2016-11-15 NOTE — Telephone Encounter (Signed)
Pt asked if he had to wear the walking boot to bed, and what he could do for pain. I told pt Dr. Charlsie Merlesegal wanted him to wear the walking boot as much as possible, but not to bed. Dr. Charlsie Merlesegal states pt can take OTC Aleve or Ibuprofen as package directs if tolerates. Pt states understanding.

## 2016-11-15 NOTE — Progress Notes (Signed)
Subjective:    Patient ID: David Vincent, male   DOB: 66 y.o.   MRN: [id number]   HPI patient states he still having a lot of pain in his right posterior tibial tendon with inflammation fluid still noted and instability of the ankle noted. States he feels that it's not functioning properly and is having trouble walking    ROS      Objective:  Physical Exam neurovascular status intact with patient noted to have dysfunction posterior tibial tendon right collapse medial longitudinal arch and discomfort within the tendon itself with upon gait analysis a promontory foot structure noted with probable moderate dysfunction of the posterior tibial tendon     Assessment:    Acute posterior tibial tendinitis right with possibility for stretch or interstitial tear of the tendon     Plan:    With failure to respond to first conservative treatment I've recommended complete immobilization and patient is placed into an air fracture walker to completely immobilize the medial longitudinal arch the posterior tibial tendon and hopefully allow repair. If not improvement we will need to consider MRI

## 2017-02-06 ENCOUNTER — Telehealth: Payer: Self-pay | Admitting: Podiatry

## 2017-02-06 NOTE — Telephone Encounter (Signed)
I saw Dr. Charlsie Merlesegal back in August and I've started having foot pain again so I've been wearing the boot which is not helping. I was wanting to know if I could get an MRI. Please call me back at [phone].

## 2017-02-08 ENCOUNTER — Ambulatory Visit (INDEPENDENT_AMBULATORY_CARE_PROVIDER_SITE_OTHER): Payer: PPO | Admitting: Podiatry

## 2017-02-08 DIAGNOSIS — T148XXA Other injury of unspecified body region, initial encounter: Secondary | ICD-10-CM | POA: Diagnosis not present

## 2017-02-08 DIAGNOSIS — M76821 Posterior tibial tendinitis, right leg: Secondary | ICD-10-CM

## 2017-02-08 MED ORDER — TRAMADOL HCL 50 MG PO TABS: 50.0000 mg | ORAL_TABLET | Freq: Three times a day (TID) | ORAL | 2 refills | Status: DC

## 2017-02-08 NOTE — Addendum Note (Signed)
Addended by: Alphia Kava'CONNELL, VALERY D on: 02/08/2017 10:37 AM   Modules accepted: Orders

## 2017-02-08 NOTE — Progress Notes (Signed)
Subjective:    Patient ID: David Vincent, male   DOB: 66 y.o.   MRN: [id number]   HPI patient presents stating he's having severe pain on the inside of his right ankle and the immobilization only helped him temporarily and he feels like it's torn tendons    ROS      Objective:  Physical Exam neurovascular status intact with patient having severe discomfort in the posterior tibial tendon as it comes under the medial malleolus right with swelling and pain as it inserts into the navicular with what appears to be apparent dysfunction of the tendon     Assessment:    Probability for interstitial or longitudinal tear of the posterior tibial tendon with possibility for other unknown pathology     Plan:    Due to the severity of discomfort and the reoccurrence despite prolonged immobilization shoe gear modification and reduced activity I have recommended MRI to rule out tear with a strong possibility this will require surgery. Patient is scheduled for MRI at this time of the right foot and ankle

## 2017-02-11 NOTE — Telephone Encounter (Signed)
Faxed required form, clinicals and demographics to [employer]. 

## 2017-02-12 ENCOUNTER — Telehealth: Payer: Self-pay | Admitting: *Deleted

## 2017-02-12 ENCOUNTER — Encounter: Payer: Self-pay | Admitting: *Deleted

## 2017-02-12 NOTE — Telephone Encounter (Signed)
Pt states he has a torn tendon, and needs a note stating how long he will need to be out of work. Dr. Charlsie Merlesegal states have pt out of work 2 weeks, and to be evaluated for treatment. I informed pt of Dr. Beverlee Nimsegal's recommendation and that he could pick up the letter today. I told pt his insurance had approved the MRI Authorization#[id number], valid 02/11/2017 - 05/12/2017.

## 2017-02-17 ENCOUNTER — Ambulatory Visit
Admission: RE | Admit: 2017-02-17 | Discharge: 2017-02-17 | Disposition: A | Discharge status: Home / Self Care | Payer: PPO | Source: Ambulatory Visit | Attending: Podiatry | Admitting: Podiatry

## 2017-02-17 DIAGNOSIS — R600 Localized edema: Secondary | ICD-10-CM | POA: Diagnosis not present

## 2017-02-19 ENCOUNTER — Encounter: Payer: Self-pay | Admitting: Internal Medicine

## 2017-02-19 ENCOUNTER — Ambulatory Visit: Payer: PPO | Admitting: Internal Medicine

## 2017-02-19 VITALS — BP 142/80 | HR 84 | Temp 98.9°F | Ht 70.0 in | Wt 266.8 lb

## 2017-02-19 DIAGNOSIS — B9789 Other viral agents as the cause of diseases classified elsewhere: Secondary | ICD-10-CM | POA: Diagnosis not present

## 2017-02-19 DIAGNOSIS — J069 Acute upper respiratory infection, unspecified: Secondary | ICD-10-CM

## 2017-02-19 NOTE — Progress Notes (Signed)
Subjective:    Patient ID: David Vincent, male    DOB: 11-06-1950, 66 y.o.   MRN: [id number]  HPI  66 year old patient who presents with a 4-day history of head and chest congestion cough hoarseness.  He has had associated headaches.  Denies any fever or chills.  No purulent sputum production.  He does have a history of treated hypertension.  Past Medical History:  Diagnosis Date  . BACK PAIN 03/10/2007  . CAD (coronary artery disease)    A.  01/05/2002 Cath - nonobs dzs (LAD 20prox, RI 30-40 prox)  . ED (erectile dysfunction)   . GERD (gastroesophageal reflux disease)   . History of mumps orchitis   . HYPERTENSION 11/15/2006  . INGUINAL HERNIA, RIGHT 12/05/2009  . LEG CRAMPS 12/03/2008  . SLEEP APNEA 11/16/2008  . Unstable angina (HCC)   . VERTIGO, POSITIONAL 08/08/2009     Social History   Socioeconomic History  . Marital status: Married    Spouse name: Not on file  . Number of children: Not on file  . Years of education: Not on file  . Highest education level: Not on file  Social Needs  . Financial resource strain: Not on file  . Food insecurity - worry: Not on file  . Food insecurity - inability: Not on file  . Transportation needs - medical: Not on file  . Transportation needs - non-medical: Not on file  Occupational History  . Not on file  Tobacco Use  . Smoking status: Never Smoker  . Smokeless tobacco: Never Used  Substance and Sexual Activity  . Alcohol use: No  . Drug use: No  . Sexual activity: Not on file  Other Topics Concern  . Not on file  Social History Narrative   05/02/2011:  Currently unemployed.  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  Does not exercise or adhere to any specific diet.    Past Surgical History:  Procedure Laterality Date  . CARDIAC CATHETERIZATION    . KNEE ARTHROSCOPY     Right    Family History  Problem Relation Age of Onset  . Pneumonia Mother        died 25 - hip fx/pna  . Heart attack Father    died 23  . Hypertension Sister        alive  . Cancer Brother        died - agent orange exposure  . Hypertension Brother        alive    Allergies  Allergen Reactions  . Bee Venom Anaphylaxis  . Codeine Phosphate Itching  . Penicillins Other (See Comments)    Has patient had a PCN reaction causing immediate rash, facial/tongue/throat swelling, SOB or lightheadedness with hypotension: Unknown, childhood reaction Has patient had a PCN reaction causing severe rash involving mucus membranes or skin necrosis: No Has patient had a PCN reaction that required hospitalization No Has patient had a PCN reaction occurring within the last 10 years: No If all of the above answers are "NO", then may proceed with Cephalosporin use.   . Prednisone Itching    Current Outpatient Medications on File Prior to Visit  Medication Sig Dispense Refill  . acetaminophen (TYLENOL) 500 MG tablet Take 1,000 mg by mouth 3 (three) times daily.    Marland Kitchen atorvastatin (LIPITOR) 20 MG tablet take 1 tablet by mouth once daily 90 tablet 3  . EPINEPHrine 0.3 mg/0.3 mL IJ SOAJ injection Inject 0.3 mLs (  0.3 mg total) into the muscle once. 1 Device 2  . losartan (COZAAR) 100 MG tablet take 1 tablet by mouth once daily 90 tablet 1  . meloxicam (MOBIC) 15 MG tablet TAKE 1 TABLET BY MOUTH ONCE A DAY WITH FOOD FOR INFLAMMATION/PAIN  0  . omeprazole (PRILOSEC) 20 MG capsule Take 20 mg by mouth every morning.    . traMADol (ULTRAM) 50 MG tablet Take 1 tablet (50 mg total) by mouth 3 (three) times daily. 90 tablet 2   No current facility-administered medications on file prior to visit.     BP (!) 142/80 (BP Location: Left Arm, Patient Position: Sitting, Cuff Size: Normal)   Pulse 84   Temp 98.9 F (37.2 C) (Oral)   Ht 5\' 10"  (1.778 m)   Wt 266 lb 12.8 oz (121 kg)   SpO2 97%   BMI 38.28 kg/m     Review of Systems  Constitutional: Positive for activity change, appetite change and fatigue. Negative for chills and fever.    HENT: Positive for congestion, postnasal drip, sinus pressure and sinus pain. Negative for dental problem, ear pain, hearing loss, sore throat, tinnitus, trouble swallowing and voice change.   Eyes: Negative for pain, discharge and visual disturbance.  Respiratory: Negative for cough, chest tightness, wheezing and stridor.   Cardiovascular: Negative for chest pain, palpitations and leg swelling.  Gastrointestinal: Negative for abdominal distention, abdominal pain, blood in stool, constipation, diarrhea, nausea and vomiting.  Genitourinary: Negative for difficulty urinating, discharge, flank pain, genital sores, hematuria and urgency.  Musculoskeletal: Negative for arthralgias, back pain, gait problem, joint swelling, myalgias and neck stiffness.  Skin: Negative for rash.  Neurological: Positive for headaches. Negative for dizziness, syncope, speech difficulty, weakness and numbness.  Hematological: Negative for adenopathy. Does not bruise/bleed easily.  Psychiatric/Behavioral: Negative for behavioral problems and dysphoric mood. The patient is not nervous/anxious.        Objective:   Physical Exam  Constitutional: He is oriented to person, place, and time. He appears well-developed. No distress.  Afebrile Hoarse  HENT:  Head: Normocephalic.  Right Ear: External ear normal.  Left Ear: External ear normal.  No focal sinus tenderness  Tonsillar enlargement  Eyes: Conjunctivae and EOM are normal.  Neck: Normal range of motion.  Cardiovascular: Normal rate and normal heart sounds.  Pulmonary/Chest: Breath sounds normal.  Abdominal: Bowel sounds are normal.  Musculoskeletal: Normal range of motion. He exhibits no edema or tenderness.  Neurological: He is alert and oriented to person, place, and time.  Psychiatric: He has a normal mood and affect. His behavior is normal.          Assessment & Plan:   Viral URI with pharyngitis and cough.  Will continue Mucinex DM and treat  symptomatically. Hypertension stable  Rogelia BogaKWIATKOWSKI,PETER FRANK

## 2017-02-19 NOTE — Telephone Encounter (Signed)
Pt states he had MRI done and wanted to know if he needed an appt for the results. I told pt I would have schedulers call and help him schedule to come in to discuss results and treatment.

## 2017-02-19 NOTE — Patient Instructions (Addendum)
Acute bronchitis symptoms for less than 10 days are generally not helped by antibiotics.  Take over-the-counter expectorants and cough medications such as  Mucinex DM.  Call if there is no improvement in 5 to 7 days or if  you develop worsening cough, fever, or new symptoms, such as shortness of breath or chest pain.   Use Afrin nasal spray every 12 hours for the next few days.  Topical nasal decongestants. They shrink swollen nasal passages and let mucus drain from your sinuses.  Hydrate and Humidify  Drink enough water to keep your urine clear or pale yellow. Staying hydrated will help to thin your mucus.  Use a cool mist humidifier to keep the humidity level in your home above 50%.  Inhale steam for 10-15 minutes, 3-4 times a day or as told by your health care provider. You can do this in the bathroom while a hot shower is running.  Limit your exposure to cool or dry air. Rest  Rest as much as possible.  Sleep with your head raised (elevated).  Make sure to get enough sleep each night.

## 2017-02-22 ENCOUNTER — Ambulatory Visit: Payer: PPO | Admitting: Podiatry

## 2017-02-22 DIAGNOSIS — M66871 Spontaneous rupture of other tendons, right ankle and foot: Secondary | ICD-10-CM | POA: Diagnosis not present

## 2017-02-22 DIAGNOSIS — M659 Synovitis and tenosynovitis, unspecified: Secondary | ICD-10-CM

## 2017-02-22 DIAGNOSIS — M79671 Pain in right foot: Secondary | ICD-10-CM | POA: Diagnosis not present

## 2017-02-22 DIAGNOSIS — M76821 Posterior tibial tendinitis, right leg: Secondary | ICD-10-CM | POA: Diagnosis not present

## 2017-02-22 DIAGNOSIS — Q742 Other congenital malformations of lower limb(s), including pelvic girdle: Secondary | ICD-10-CM | POA: Diagnosis not present

## 2017-02-25 ENCOUNTER — Telehealth: Payer: Self-pay | Admitting: *Deleted

## 2017-02-25 NOTE — Telephone Encounter (Signed)
"  I saw Dr. Charlsie Merlesegal on Friday.  He said I needed to have surgery and said that he was going to let Dr. Samuella CotaPrice do my surgery.  I decided I don't want to have it done this year because I have a $3200 deductible that starts over in January.  So, I'd like to wait until January.  Is this going to be a problem?"  It will not be a problem that I am aware of, I will let Dr. Samuella CotaPrice know and if there's a problem, I will let you know.  Dr. Samuella CotaPrice can do it on January 2.  "That date will be fine."

## 2017-03-06 ENCOUNTER — Other Ambulatory Visit: Payer: Self-pay | Admitting: Internal Medicine

## 2017-03-27 ENCOUNTER — Telehealth: Payer: Self-pay | Admitting: *Deleted

## 2017-03-27 NOTE — Telephone Encounter (Signed)
"  I am scheduled for surgery on January 2.  I am calling to see if I can use a knee scooter instead of crutches.  He told me I would be non-weight bearing."  Yes, it's okay for you to use a scooter.  I will put in an order for you to get one.  "No, I have already ordered one.  I wanted to make sure I had one because I am a big dude is using crutches would be difficult.  I weigh 264 lbs.  I also want to know about my insurance.  Do I need to do anything to get my surgery approved?"  No, I will make sure it's authorized before your surgery date.  "Okay, that's all I needed to know."

## 2017-03-27 NOTE — Telephone Encounter (Signed)
"  I have an appointment for surgery on January 2.  I talked to the surgical center and they told me to get in touch with you."

## 2017-04-04 ENCOUNTER — Other Ambulatory Visit: Payer: Self-pay

## 2017-04-04 NOTE — Patient Outreach (Signed)
[facility]) Care Management  04/04/2017  David [name] 01/04/1951 [id number]   Telephone Screen  Referral Date: 04/03/17 Referral Source: [employer] Referral Reason: "member needs a referral to a dentist for eval and teeth cleaning, member is having surgery in a couple of weeks for a torn tendon in his right foot, he will need PT, exercise and diet program" Insurance: HT   Outreach attempt # 1 to patient. Spoke with patient. He voices he is doing fair. He is awaiting his surgery which is scheduled as outpatient on 04/09/17. He voices that he has been told he will need therapy temporarily and MD will get that arranged. He has supportive family who will be able to assist him as well. He denies any issues with meds and or transportation. He is aware that he needs to establish care with a dentist. However, he voices that he does not plan to address his until have he has had surgery and recovered. He is aware of his medical conditions and knows when and how to contact MD.  He is aware that he can contact his assigned HTA concierge to find in [employer] data processornetwork local dentist offices. He denies any RN CM or [facility] needs at this time. He was appreciative of f/u calls.       Plan: RN CM will notify [facility] administrative assistant of case status.   Antionette Fairyoshanda Arriyana Rodell, RN,BSN,CCM [facility] Care [facility] Telephonic Care Management Coordinator Direct Phone: [phone] Toll Free: [phone] Fax: [phone]

## 2017-04-08 ENCOUNTER — Telehealth: Payer: Self-pay | Admitting: Podiatry

## 2017-04-08 NOTE — Telephone Encounter (Signed)
Called and spoke with Alonda at [facility] in regards to prior authorization requested on 28 March 2017 for out patient surgery scheduled this Wednesday 10 April 2017. Rita Oharalonda stated it was authorized with the Authorization number of [id number] from 01.02.2019 - 04.02.2019.

## 2017-04-09 HISTORY — PX: FOOT SURGERY: SHX648

## 2017-04-10 ENCOUNTER — Encounter: Payer: Self-pay | Admitting: Podiatry

## 2017-04-10 DIAGNOSIS — E7800 Pure hypercholesterolemia, unspecified: Secondary | ICD-10-CM | POA: Diagnosis not present

## 2017-04-10 DIAGNOSIS — M79671 Pain in right foot: Secondary | ICD-10-CM | POA: Diagnosis not present

## 2017-04-10 DIAGNOSIS — M76821 Posterior tibial tendinitis, right leg: Secondary | ICD-10-CM | POA: Diagnosis not present

## 2017-04-10 DIAGNOSIS — M25571 Pain in right ankle and joints of right foot: Secondary | ICD-10-CM | POA: Diagnosis not present

## 2017-04-10 DIAGNOSIS — Q742 Other congenital malformations of lower limb(s), including pelvic girdle: Secondary | ICD-10-CM | POA: Diagnosis not present

## 2017-04-10 DIAGNOSIS — M659 Synovitis and tenosynovitis, unspecified: Secondary | ICD-10-CM | POA: Diagnosis not present

## 2017-04-10 DIAGNOSIS — Q6689 Other  specified congenital deformities of feet: Secondary | ICD-10-CM | POA: Diagnosis not present

## 2017-04-10 DIAGNOSIS — M66871 Spontaneous rupture of other tendons, right ankle and foot: Secondary | ICD-10-CM | POA: Diagnosis not present

## 2017-04-11 NOTE — Progress Notes (Signed)
DOS:04/10/2017 Recession of accessory/navicular bone and repair and transfer of posterior tibial tendon tears right foot.

## 2017-04-12 ENCOUNTER — Telehealth: Payer: Self-pay | Admitting: Podiatry

## 2017-04-12 NOTE — Telephone Encounter (Signed)
Pt states he has itching reaction Penicillin and is now having itching after taking the clindamycin. I told pt to take benadryl for the itching and to stop the clindamycin and I would inform Dr. Samuella CotaPrice. Pt states he also has problems with codiene, and was prescribed oxycodone. I told pt oxycodone was a synthetic medication not a codiene pain medication and was probably less likely to give him a reaction. Pt asked if he could take ibuprofen and I told him if he could tolerate he could take as the package directs in between the dosing of the oxycodone. Pt states understanding.

## 2017-04-12 NOTE — Telephone Encounter (Signed)
Dr. Samuella CotaPrice states the clindamycin is not in the penicillin family, and to continue the clindamycin, take benadryl, the likely cause of the itching is the oxycodone, so can decrease dosing and take the ibuprofen. Pt states he has 200mg  Ibuprofen and can take 4 and I told him he could take it 3 times a day. Pt states understanding.

## 2017-04-12 NOTE — Telephone Encounter (Signed)
I had surgery by Dr. Samuella CotaPrice on Wednesday and I missed a call and wasn't sure who called. I do have a question about the prescription he prescribed. I'm allergic to penicillin and the medication he prescribed is clindanycin and I wanted to know if that would make me have an itching reaction. Give me a call back at [phone].

## 2017-04-17 ENCOUNTER — Ambulatory Visit (INDEPENDENT_AMBULATORY_CARE_PROVIDER_SITE_OTHER): Payer: PPO | Admitting: Podiatry

## 2017-04-17 VITALS — BP 130/82 | HR 82 | Temp 98.7°F

## 2017-04-17 DIAGNOSIS — Z9889 Other specified postprocedural states: Secondary | ICD-10-CM

## 2017-04-17 DIAGNOSIS — M76821 Posterior tibial tendinitis, right leg: Secondary | ICD-10-CM

## 2017-04-22 NOTE — Progress Notes (Signed)
Patient presents DOS:04/10/2017 Recession of accessory/navicular bone and repair and transfer of posterior tibial tendon tear right foot. He is here today for a post operative exam and cast application. Currently he is managing his pain effectively at the moment.  VS are stable and patient is afebrile   Surgical site appears to be healing well. Noted mild swelling to surgical foot but WNL for post op state. All staples are intact. No erythema, drainage or other s/s of infection noted. Applied below the knee hard cast and advised on s/s of DVT.Patient stated comfort with cast and optimal positioning of foot was obtained.   He is to follow up at regular post op appt or sooner if problems arise

## 2017-04-24 ENCOUNTER — Ambulatory Visit (INDEPENDENT_AMBULATORY_CARE_PROVIDER_SITE_OTHER): Payer: PPO | Admitting: Podiatry

## 2017-04-24 ENCOUNTER — Encounter: Payer: Self-pay | Admitting: Podiatry

## 2017-04-24 DIAGNOSIS — Z9889 Other specified postprocedural states: Secondary | ICD-10-CM

## 2017-04-24 DIAGNOSIS — Q742 Other congenital malformations of lower limb(s), including pelvic girdle: Secondary | ICD-10-CM | POA: Diagnosis not present

## 2017-04-24 DIAGNOSIS — M76821 Posterior tibial tendinitis, right leg: Secondary | ICD-10-CM | POA: Diagnosis not present

## 2017-04-24 DIAGNOSIS — M79671 Pain in right foot: Secondary | ICD-10-CM

## 2017-04-24 NOTE — Progress Notes (Signed)
  Subjective:  Patient ID: David Vincent, male    DOB: 06/22/1950,  MRN: [id number]  Chief Complaint  Patient presents with  . Routine Post Op    04-10-17 and i am doing ok     DOS: 04-10-17 Procedure: R Foot Kidner procedure.  67 y.o. male returns for post-op check. Denies N/V/F/Ch. Pain is controlled with current medications.  Objective:   General AA&O x3. Normal mood and affect.  Vascular Foot warm and well perfused.  Neurologic Gross sensation intact.  Dermatologic Skin healing well without signs of infection. Skin edges well coapted without signs of infection.  Orthopedic: Tenderness to palpation noted about the surgical site. PT tendon appears intact. Good strength noted to the PTT.    Assessment & Plan:  Patient was evaluated and treated and all questions answered.  S/p R Kidner Procedure -Progressing as expected post-operatively. -Sutures: left intact.. -Medications refilled: none -Cast reapplied today. -Foot redressed. -Will start PT in 2 weeks.  Return in about 2 weeks (around 05/08/2017) for Post-op.

## 2017-04-28 NOTE — Progress Notes (Signed)
  Subjective:  Patient ID: David Vincent, male    DOB: 11/02/1950,  MRN: [id number]  Chief Complaint  Patient presents with  . MRI results    Review MRI for R foot and ankle. Pt. stated,"foot still hurts 7/10 sharp pain."   67 y.o. male returns for the above complaint.  Had MRI performed and here for review.  Reports continued sharp pain to the right foot rated at 7 out of 10  Objective:  There were no vitals filed for this visit. General AA&O x3. Normal mood and affect.  Vascular Pedal pulses palpable.  Neurologic Epicritic sensation grossly intact.  Dermatologic No open lesions. Skin normal texture and turgor.  Orthopedic:  Pain to palpation right PT insertion with prominent accessory navicular   Assessment & Plan:  Patient was evaluated and treated and all questions answered.  Right foot posterior tibial tendinitis with accessory navicular -MRI reviewed patient.  Interstitial insertional tearing with accessory navicular. -Discussed conservative versus surgical management with patient.  Patient wishes to proceed with surgical correction.  We will proceed with resection of the accessory navicular with possible re-anchoring of the PT tendon fibers at the insertion.  All risk benefits alternatives explained to the patient no guarantees given.  Will contact patient with a date for surgery  15 minutes of face to face time were spent with the patient. >50% of this was spent on counseling and coordination of care. Specifically discussed with patient the above diagnosis and treatment plan .  No Follow-up on file.

## 2017-05-08 ENCOUNTER — Ambulatory Visit (INDEPENDENT_AMBULATORY_CARE_PROVIDER_SITE_OTHER): Payer: PPO | Admitting: Podiatry

## 2017-05-08 DIAGNOSIS — Z9889 Other specified postprocedural states: Secondary | ICD-10-CM

## 2017-05-08 NOTE — Progress Notes (Signed)
  Subjective:  Patient ID: David Vincent, male    DOB: 08/28/1950,  MRN: [id number]  No chief complaint on file.   DOS: 04-10-17 Procedure: R Foot Kidner procedure.  67 y.o. male returns for post-op check.  At the area of surgical site.  Endorses some pain and occasional tingling in the right great toe.  Objective:   General AA&O x3. Normal mood and affect.  Vascular Foot warm and well perfused.  Neurologic Gross sensation intact.  Dermatologic  skin well-healed with intact suture material.  No erythema edema or signs of infection.  No ascending cellulitis.  No drainage.  Orthopedic:  No tenderness noted about the surgical site    Assessment & Plan:  Patient was evaluated and treated and all questions answered.  S/p R Kidner Procedure -Progressing as expected post-operatively. -Sutures: Removed today -Medications refilled: none -Soft dressing in cam boot applied today. -Foot redressed. -Patient to start physical therapy for passive and active range of motion.  We will continue nonweightbearing for 2 weeks with plan to transition to weightbearing at that time  Return in about 2 weeks (around 05/22/2017).

## 2017-05-09 ENCOUNTER — Telehealth: Payer: Self-pay | Admitting: *Deleted

## 2017-05-09 DIAGNOSIS — Z9889 Other specified postprocedural states: Secondary | ICD-10-CM

## 2017-05-09 NOTE — Telephone Encounter (Signed)
-----   Message from Park LiterMichael J Price, DPM sent at 05/08/2017  9:35 PM EST ----- Regarding: PT referral  [facility] next to the office.  See note for details

## 2017-05-09 NOTE — Progress Notes (Signed)
Seen with RN and agree with plan.

## 2017-05-13 DIAGNOSIS — M79671 Pain in right foot: Secondary | ICD-10-CM | POA: Diagnosis not present

## 2017-05-13 DIAGNOSIS — Z4789 Encounter for other orthopedic aftercare: Secondary | ICD-10-CM | POA: Diagnosis not present

## 2017-05-22 ENCOUNTER — Encounter: Payer: Self-pay | Admitting: Podiatry

## 2017-05-22 ENCOUNTER — Ambulatory Visit (INDEPENDENT_AMBULATORY_CARE_PROVIDER_SITE_OTHER): Payer: PPO | Admitting: Podiatry

## 2017-05-22 DIAGNOSIS — Z9889 Other specified postprocedural states: Secondary | ICD-10-CM

## 2017-05-23 NOTE — Progress Notes (Signed)
  Subjective:  Patient ID: Martha ClanMichael P Forinash, male    DOB: 10/31/1950,  MRN: [id number]  No chief complaint on file.   DOS: 04/10/2017 Procedure: Right foot kidner procedure  67 y.o. male returns for post-op check. Denies N/V/F/Ch.  Using the knee scooter for assistance with ambulation has been  Objective:   General AA&O x3. Normal mood and affect.  Vascular Foot warm and well perfused.  Neurologic Gross sensation intact.  Dermatologic Skin well-healed.  No erythema.  No signs of infection  Orthopedic:  No tenderness to palpation noted about the surgical site.    Assessment & Plan:  Patient was evaluated and treated and all questions answered.  S/p right foot Kidner procedure -Progressing as expected post-operatively. -At this time will transition to weightbearing.  Patient advised to progress with weight bearing with physical therapy.  Patient to start with walker assistance and transition to not using assistive devices.  Advised he is okay to drive when he can no longer requires ambulation with assistive devices  Follow-up in 4 weeks  No Follow-up on file.

## 2017-05-24 DIAGNOSIS — M79671 Pain in right foot: Secondary | ICD-10-CM | POA: Diagnosis not present

## 2017-05-24 DIAGNOSIS — Z4789 Encounter for other orthopedic aftercare: Secondary | ICD-10-CM | POA: Diagnosis not present

## 2017-05-27 DIAGNOSIS — Z4789 Encounter for other orthopedic aftercare: Secondary | ICD-10-CM | POA: Diagnosis not present

## 2017-05-27 DIAGNOSIS — M79671 Pain in right foot: Secondary | ICD-10-CM | POA: Diagnosis not present

## 2017-06-03 DIAGNOSIS — Z4789 Encounter for other orthopedic aftercare: Secondary | ICD-10-CM | POA: Diagnosis not present

## 2017-06-03 DIAGNOSIS — M79671 Pain in right foot: Secondary | ICD-10-CM | POA: Diagnosis not present

## 2017-06-05 DIAGNOSIS — Z4789 Encounter for other orthopedic aftercare: Secondary | ICD-10-CM | POA: Diagnosis not present

## 2017-06-05 DIAGNOSIS — M79671 Pain in right foot: Secondary | ICD-10-CM | POA: Diagnosis not present

## 2017-06-06 ENCOUNTER — Ambulatory Visit (INDEPENDENT_AMBULATORY_CARE_PROVIDER_SITE_OTHER): Payer: PPO | Admitting: Podiatry

## 2017-06-06 DIAGNOSIS — Z9889 Other specified postprocedural states: Secondary | ICD-10-CM

## 2017-06-10 DIAGNOSIS — Z4789 Encounter for other orthopedic aftercare: Secondary | ICD-10-CM | POA: Diagnosis not present

## 2017-06-10 DIAGNOSIS — M79671 Pain in right foot: Secondary | ICD-10-CM | POA: Diagnosis not present

## 2017-06-13 DIAGNOSIS — M79671 Pain in right foot: Secondary | ICD-10-CM | POA: Diagnosis not present

## 2017-06-13 DIAGNOSIS — Z4789 Encounter for other orthopedic aftercare: Secondary | ICD-10-CM | POA: Diagnosis not present

## 2017-06-17 DIAGNOSIS — M79671 Pain in right foot: Secondary | ICD-10-CM | POA: Diagnosis not present

## 2017-06-17 DIAGNOSIS — Z4789 Encounter for other orthopedic aftercare: Secondary | ICD-10-CM | POA: Diagnosis not present

## 2017-06-20 ENCOUNTER — Telehealth: Payer: Self-pay | Admitting: Podiatry

## 2017-06-20 NOTE — Telephone Encounter (Signed)
I had surgery by Dr. Samuella CotaPrice on 02 January. I have been seeing him and he told me not to come back until the 28 th but I'm having some pain in the area where the surgery was at. I had therapy Monday and every time I have therapy it hurts for the next 2-3 days. I was just wondering if I needed to come back and see him. If you would give me a call and let me know. My number is [phone]. Thank you.

## 2017-06-21 ENCOUNTER — Telehealth: Payer: Self-pay | Admitting: Podiatry

## 2017-06-21 NOTE — Telephone Encounter (Signed)
I was calling to see if someone could tell me why I'm having pain in my foot where I had my surgery at. I had surgery back on 02 January. You can give me a call back at [phone]. Thank you.

## 2017-06-21 NOTE — Telephone Encounter (Signed)
I spoke with pt, he states the PT at last visit caused his foot to hurt for about 3 days and he had to take ibuprofen. I told pt that PT was loosening tissue that had stiffened when immobilized by the shoe and the dressing, and internal swelling and scar tissue, that the exercises were loosening those areas up, it would be 9-12 months before full surgical healing. I spoke with Ddr. Samuella CotaPrice and he said that was not unusual as long as the discomfort was tolerable with ibuprofen then he was progressing as expected. Pt states he was told to go into an athletic shoe at the last visit. I told pt that he may want to go more gradually in to the athletic shoe from the surgical shoe. I instructed pt to wear the surgical shoe when out of the house for stability and comfort, wear the athletic shoe in the house until uncomfortable then go into the surgical shoe and may switch back and forth between the two as comfort allows. Pt states understanding and asked if he should go back to work and I told him it depended on his comfort and job requirements. Pt states he thinks he will tell work he will be our another week.

## 2017-07-01 DIAGNOSIS — Z4789 Encounter for other orthopedic aftercare: Secondary | ICD-10-CM | POA: Diagnosis not present

## 2017-07-01 DIAGNOSIS — M79671 Pain in right foot: Secondary | ICD-10-CM | POA: Diagnosis not present

## 2017-07-04 ENCOUNTER — Ambulatory Visit (INDEPENDENT_AMBULATORY_CARE_PROVIDER_SITE_OTHER): Payer: PPO | Admitting: Podiatry

## 2017-07-04 ENCOUNTER — Encounter: Payer: Self-pay | Admitting: Podiatry

## 2017-07-04 DIAGNOSIS — Z9889 Other specified postprocedural states: Secondary | ICD-10-CM

## 2017-07-04 NOTE — Progress Notes (Signed)
  Subjective:  Patient ID: David Vincent, male    DOB: 11/19/1950,  MRN: [id number]  No chief complaint on file.   DOS: 04/10/2017 Procedure: Right foot kidner procedure  67 y.o. male returns for post-op check. Denies N/V/F/Ch.  Inability normal shoe gear without issue.  Patient pleased with results of surgery and states that his pain is much better than it was prior to surgery.  Denies pain today.  Only reports occasional pain.  Objective:   General AA&O x3. Normal mood and affect.  Vascular Foot warm and well perfused.  Neurologic Gross sensation intact.  Dermatologic Skin well-healed.  No erythema.  No signs of infection  Orthopedic:  No tenderness to palpation noted about the surgical site.    Assessment & Plan:  Patient was evaluated and treated and all questions answered.  S/p right foot Kidner procedure -Progressing as expected post-operatively. -At this doing very well.  Weightbearing in normal shoe gear without issue.  Denies consistent pain reports only occasional pain.  Pleased with results of surgery.  States that his pain is a lot less than his prior to surgery.  Reports nerve symptoms are resolving.  Has completed physical therapy.  Return in about 6 weeks (around 08/15/2017) for Post-op.

## 2017-07-05 ENCOUNTER — Telehealth: Payer: Self-pay | Admitting: Podiatry

## 2017-07-05 NOTE — Telephone Encounter (Signed)
Pt is scheduled to return to work today and note does not specify if it will be light duty. The patient is scheduled to to work the weekend, please respond asap to [employer] [phone]. 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

## 2017-07-08 NOTE — Telephone Encounter (Signed)
Dr. Samuella CotaPrice skyped to allow pt 2 weeks of light duty, then to full duty. Phone rang over 1 minutes without answering services.

## 2017-08-06 ENCOUNTER — Other Ambulatory Visit: Payer: Self-pay | Admitting: Internal Medicine

## 2017-08-06 NOTE — Telephone Encounter (Signed)
Copied from CRM [id number]. Topic: Quick Communication - See Telephone Encounter >> Aug 06, 2017  4:13 PM Marylen Ponto wrote: CRM for notification. See Telephone encounter for: 08/06/17. Patient is requesting that an Epipen be called in.   [facility] [address] [place] - [address] [place] [address] Phone: [phone] Fax: [phone]

## 2017-08-07 MED ORDER — EPINEPHRINE 0.3 MG/0.3ML IJ SOAJ: 0.3000 mg | Freq: Once | INTRAMUSCULAR | 2 refills | Status: AC

## 2017-08-07 NOTE — Telephone Encounter (Signed)
Medication filled to pharmacy as requested.   Patient is overdue for 6 months follow-up and due for CPE in June 2019.  Recommend scheduling CPE for June.  Called patient and left message to return call.

## 2017-08-07 NOTE — Telephone Encounter (Signed)
Okay for refill?  

## 2017-08-07 NOTE — Telephone Encounter (Signed)
Medication filled to pharmacy as requested.   

## 2017-08-07 NOTE — Telephone Encounter (Signed)
LOV  02/19/17 Dr. Amador Cunas Last refill  09/23/15   1 pen with 2 refills

## 2017-08-07 NOTE — Telephone Encounter (Signed)
Okay to refill EpiPen?

## 2017-08-15 ENCOUNTER — Ambulatory Visit: Payer: PPO | Admitting: Podiatry

## 2017-08-15 DIAGNOSIS — M79671 Pain in right foot: Secondary | ICD-10-CM

## 2017-08-15 DIAGNOSIS — Q742 Other congenital malformations of lower limb(s), including pelvic girdle: Secondary | ICD-10-CM

## 2017-08-15 DIAGNOSIS — M76821 Posterior tibial tendinitis, right leg: Secondary | ICD-10-CM | POA: Diagnosis not present

## 2017-09-06 NOTE — Progress Notes (Signed)
  Subjective:  Patient ID: David Vincent, male    DOB: 07/05/1950,  MRN: [id number]  Chief Complaint  Patient presents with  . Routine Post Care Onep      dos 01.02.2019 Repair Post Tibial Tendon/Secondary Rt; Kidner Advanced Tendon Rt    DOS: 04/10/2017 Procedure: Right foot kidner procedure  67 y.o. male returns for post-op check. Denies N/V/F/Ch.  Doing very well ambulating normal shoe gear without issue denies complaints  Objective:   General AA&O x3. Normal mood and affect.  Vascular Foot warm and well perfused.  Neurologic Gross sensation intact.  Dermatologic Skin well-healed.  No erythema.  No signs of infection  Orthopedic:  No tenderness to palpation noted about the surgical site.    Assessment & Plan:  Patient was evaluated and treated and all questions answered.  S/p right foot Kidner procedure -Progressing as expected post-operatively. -Doing well ambulating only with minimal pain.  Advised that swelling and pain could persist for several months but should resolve.  Follow-up PRN Return if symptoms worsen or fail to improve.

## 2017-09-30 ENCOUNTER — Encounter: Payer: Self-pay | Admitting: Internal Medicine

## 2017-09-30 ENCOUNTER — Ambulatory Visit (INDEPENDENT_AMBULATORY_CARE_PROVIDER_SITE_OTHER): Payer: PPO | Admitting: Internal Medicine

## 2017-09-30 VITALS — BP 138/88 | HR 74 | Temp 98.1°F | Wt 270.0 lb

## 2017-09-30 DIAGNOSIS — I2510 Atherosclerotic heart disease of native coronary artery without angina pectoris: Secondary | ICD-10-CM | POA: Diagnosis not present

## 2017-09-30 DIAGNOSIS — I10 Essential (primary) hypertension: Secondary | ICD-10-CM

## 2017-09-30 DIAGNOSIS — Z0000 Encounter for general adult medical examination without abnormal findings: Secondary | ICD-10-CM

## 2017-09-30 DIAGNOSIS — Z23 Encounter for immunization: Secondary | ICD-10-CM | POA: Diagnosis not present

## 2017-09-30 LAB — COMPREHENSIVE METABOLIC PANEL
ALBUMIN: 4.5 g/dL (ref 3.5–5.2)
ALK PHOS: 101 U/L (ref 39–117)
ALT: 15 U/L (ref 0–53)
AST: 19 U/L (ref 0–37)
BUN: 16 mg/dL (ref 6–23)
CO2: 26 mEq/L (ref 19–32)
Calcium: 9.6 mg/dL (ref 8.4–10.5)
Chloride: 106 mEq/L (ref 96–112)
Creatinine, Ser: 0.98 mg/dL (ref 0.40–1.50)
GFR: 81.13 mL/min (ref >60.00)
GLUCOSE: 82 mg/dL (ref 70–99)
POTASSIUM: 4.1 meq/L (ref 3.5–5.1)
Sodium: 143 mEq/L (ref 135–145)
TOTAL PROTEIN: 7.3 g/dL (ref 6.0–8.3)
Total Bilirubin: 0.7 mg/dL (ref 0.2–1.2)

## 2017-09-30 LAB — LIPID PANEL
CHOLESTEROL: 112 mg/dL (ref 0–200)
HDL: 37.6 mg/dL — AB (ref >39.00)
LDL Cholesterol: 43 mg/dL (ref 0–99)
NONHDL: 74.49
Total CHOL/HDL Ratio: 3
Triglycerides: 158 mg/dL — ABNORMAL HIGH (ref 0.0–149.0)
VLDL: 31.6 mg/dL (ref 0.0–40.0)

## 2017-09-30 LAB — CBC WITH DIFFERENTIAL/PLATELET
BASOS ABS: 0.1 10*3/uL (ref 0.0–0.1)
Basophils Relative: 0.9 % (ref 0.0–3.0)
EOS PCT: 2.4 % (ref 0.0–5.0)
Eosinophils Absolute: 0.2 10*3/uL (ref 0.0–0.7)
HCT: 41.4 % (ref 39.0–52.0)
Hemoglobin: 14.3 g/dL (ref 13.0–17.0)
LYMPHS ABS: 2.7 10*3/uL (ref 0.7–4.0)
Lymphocytes Relative: 33.8 % (ref 12.0–46.0)
MCHC: 34.6 g/dL (ref 30.0–36.0)
MCV: 93.9 fl (ref 78.0–100.0)
MONO ABS: 0.5 10*3/uL (ref 0.1–1.0)
MONOS PCT: 6.7 % (ref 3.0–12.0)
NEUTROS PCT: 56.2 % (ref 43.0–77.0)
Neutro Abs: 4.5 10*3/uL (ref 1.4–7.7)
Platelets: 253 10*3/uL (ref 150.0–400.0)
RBC: 4.41 Mil/uL (ref 4.22–5.81)
RDW: 13.9 % (ref 11.5–15.5)
WBC: 7.9 10*3/uL (ref 4.0–10.5)

## 2017-09-30 LAB — TSH: TSH: 0.73 u[IU]/mL (ref 0.35–4.50)

## 2017-09-30 MED ORDER — EPINEPHRINE 0.3 MG/0.3ML IJ SOAJ: 0.3000 mg | Freq: Once | INTRAMUSCULAR | 3 refills | Status: AC

## 2017-09-30 NOTE — Patient Instructions (Addendum)
Limit your sodium (Salt) intake  Please check your blood pressure on a regular basis.  If it is consistently greater than 150/90, please make an office appointment.    It is important that you exercise regularly, at least 20 minutes 3 to 4 times per week.  If you develop chest pain or shortness of breath seek  medical attention.  You need to lose weight.  Consider a lower calorie diet and regular exercise. Back Exercises The following exercises strengthen the muscles that help to support the back. They also help to keep the lower back flexible. Doing these exercises can help to prevent back pain or lessen existing pain. If you have back pain or discomfort, try doing these exercises 2-3 times each day or as told by your health care provider. When the pain goes away, do them once each day, but increase the number of times that you repeat the steps for each exercise (do more repetitions). If you do not have back pain or discomfort, do these exercises once each day or as told by your health care provider. Exercises Single Knee to Chest  Repeat these steps 3-5 times for each leg: 1. Lie on your back on a firm bed or the floor with your legs extended. 2. Bring one knee to your chest. Your other leg should stay extended and in contact with the floor. 3. Hold your knee in place by grabbing your knee or thigh. 4. Pull on your knee until you feel a gentle stretch in your lower back. 5. Hold the stretch for 10-30 seconds. 6. Slowly release and straighten your leg.  Pelvic Tilt  Repeat these steps 5-10 times: 1. Lie on your back on a firm bed or the floor with your legs extended. 2. Bend your knees so they are pointing toward the ceiling and your feet are flat on the floor. 3. Tighten your lower abdominal muscles to press your lower back against the floor. This motion will tilt your pelvis so your tailbone points up toward the ceiling instead of pointing to your feet or the floor. 4. With gentle  tension and even breathing, hold this position for 5-10 seconds.  Cat-Cow  Repeat these steps until your lower back becomes more flexible: 1. Get into a hands-and-knees position on a firm surface. Keep your hands under your shoulders, and keep your knees under your hips. You may place padding under your knees for comfort. 2. Let your head hang down, and point your tailbone toward the floor so your lower back becomes rounded like the back of a cat. 3. Hold this position for 5 seconds. 4. Slowly lift your head and point your tailbone up toward the ceiling so your back forms a sagging arch like the back of a cow. 5. Hold this position for 5 seconds.  Press-Ups  Repeat these steps 5-10 times: 1. Lie on your abdomen (face-down) on the floor. 2. Place your palms near your head, about shoulder-width apart. 3. While you keep your back as relaxed as possible and keep your hips on the floor, slowly straighten your arms to raise the top half of your body and lift your shoulders. Do not use your back muscles to raise your upper torso. You may adjust the placement of your hands to make yourself more comfortable. 4. Hold this position for 5 seconds while you keep your back relaxed. 5. Slowly return to lying flat on the floor.  Bridges  Repeat these steps 10 times: 1. Lie on your back  on a firm surface. 2. Bend your knees so they are pointing toward the ceiling and your feet are flat on the floor. 3. Tighten your buttocks muscles and lift your buttocks off of the floor until your waist is at almost the same height as your knees. You should feel the muscles working in your buttocks and the back of your thighs. If you do not feel these muscles, slide your feet 1-2 inches farther away from your buttocks. 4. Hold this position for 3-5 seconds. 5. Slowly lower your hips to the starting position, and allow your buttocks muscles to relax completely.  If this exercise is too easy, try doing it with your arms  crossed over your chest. Abdominal Crunches  Repeat these steps 5-10 times: 1. Lie on your back on a firm bed or the floor with your legs extended. 2. Bend your knees so they are pointing toward the ceiling and your feet are flat on the floor. 3. Cross your arms over your chest. 4. Tip your chin slightly toward your chest without bending your neck. 5. Tighten your abdominal muscles and slowly raise your trunk (torso) high enough to lift your shoulder blades a tiny bit off of the floor. Avoid raising your torso higher than that, because it can put too much stress on your low back and it does not help to strengthen your abdominal muscles. 6. Slowly return to your starting position.  Back Lifts Repeat these steps 5-10 times: 1. Lie on your abdomen (face-down) with your arms at your sides, and rest your forehead on the floor. 2. Tighten the muscles in your legs and your buttocks. 3. Slowly lift your chest off of the floor while you keep your hips pressed to the floor. Keep the back of your head in line with the curve in your back. Your eyes should be looking at the floor. 4. Hold this position for 3-5 seconds. 5. Slowly return to your starting position.  Contact a health care provider if:  Your back pain or discomfort gets much worse when you do an exercise.  Your back pain or discomfort does not lessen within 2 hours after you exercise. If you have any of these problems, stop doing these exercises right away. Do not do them again unless your health care provider says that you can. Get help right away if:  You develop sudden, severe back pain. If this happens, stop doing the exercises right away. Do not do them again unless your health care provider says that you can. This information is not intended to replace advice given to you by your health care provider. Make sure you discuss any questions you have with your health care provider. Document Released: 05/03/2004 Document Revised:  08/03/2015 Document Reviewed: 05/20/2014 Elsevier Interactive Patient Education  2017 [employer] Inc[employer]  Back Exercises The following exercises strengthen the muscles that help to support the back. They also help to keep the lower back flexible. Doing these exercises can help to prevent back pain or lessen existing pain. If you have back pain or discomfort, try doing these exercises 2-3 times each day or as told by your health care provider. When the pain goes away, do them once each day, but increase the number of times that you repeat the steps for each exercise (do more repetitions). If you do not have back pain or discomfort, do these exercises once each day or as told by your health care provider. Exercises Single Knee to Chest  Repeat these steps 3-5  times for each leg: 7. Lie on your back on a firm bed or the floor with your legs extended. 8. Bring one knee to your chest. Your other leg should stay extended and in contact with the floor. 9. Hold your knee in place by grabbing your knee or thigh. 10. Pull on your knee until you feel a gentle stretch in your lower back. 11. Hold the stretch for 10-30 seconds. 12. Slowly release and straighten your leg.  Pelvic Tilt  Repeat these steps 5-10 times: 5. Lie on your back on a firm bed or the floor with your legs extended. 6. Bend your knees so they are pointing toward the ceiling and your feet are flat on the floor. 7. Tighten your lower abdominal muscles to press your lower back against the floor. This motion will tilt your pelvis so your tailbone points up toward the ceiling instead of pointing to your feet or the floor. 8. With gentle tension and even breathing, hold this position for 5-10 seconds.  Cat-Cow  Repeat these steps until your lower back becomes more flexible: 6. Get into a hands-and-knees position on a firm surface. Keep your hands under your shoulders, and keep your knees under your hips. You may place padding under your  knees for comfort. 7. Let your head hang down, and point your tailbone toward the floor so your lower back becomes rounded like the back of a cat. 8. Hold this position for 5 seconds. 9. Slowly lift your head and point your tailbone up toward the ceiling so your back forms a sagging arch like the back of a cow. 10. Hold this position for 5 seconds.  Press-Ups  Repeat these steps 5-10 times: 6. Lie on your abdomen (face-down) on the floor. 7. Place your palms near your head, about shoulder-width apart. 8. While you keep your back as relaxed as possible and keep your hips on the floor, slowly straighten your arms to raise the top half of your body and lift your shoulders. Do not use your back muscles to raise your upper torso. You may adjust the placement of your hands to make yourself more comfortable. 9. Hold this position for 5 seconds while you keep your back relaxed. 10. Slowly return to lying flat on the floor.  Bridges  Repeat these steps 10 times: 6. Lie on your back on a firm surface. 7. Bend your knees so they are pointing toward the ceiling and your feet are flat on the floor. 8. Tighten your buttocks muscles and lift your buttocks off of the floor until your waist is at almost the same height as your knees. You should feel the muscles working in your buttocks and the back of your thighs. If you do not feel these muscles, slide your feet 1-2 inches farther away from your buttocks. 9. Hold this position for 3-5 seconds. 10. Slowly lower your hips to the starting position, and allow your buttocks muscles to relax completely.  If this exercise is too easy, try doing it with your arms crossed over your chest. Abdominal Crunches  Repeat these steps 5-10 times: 7. Lie on your back on a firm bed or the floor with your legs extended. 8. Bend your knees so they are pointing toward the ceiling and your feet are flat on the floor. 9. Cross your arms over your chest. 10. Tip your chin  slightly toward your chest without bending your neck. 11. Tighten your abdominal muscles and slowly raise your trunk (torso) high  enough to lift your shoulder blades a tiny bit off of the floor. Avoid raising your torso higher than that, because it can put too much stress on your low back and it does not help to strengthen your abdominal muscles. 12. Slowly return to your starting position.  Back Lifts Repeat these steps 5-10 times: 6. Lie on your abdomen (face-down) with your arms at your sides, and rest your forehead on the floor. 7. Tighten the muscles in your legs and your buttocks. 8. Slowly lift your chest off of the floor while you keep your hips pressed to the floor. Keep the back of your head in line with the curve in your back. Your eyes should be looking at the floor. 9. Hold this position for 3-5 seconds. 10. Slowly return to your starting position.  Contact a health care provider if:  Your back pain or discomfort gets much worse when you do an exercise.  Your back pain or discomfort does not lessen within 2 hours after you exercise. If you have any of these problems, stop doing these exercises right away. Do not do them again unless your health care provider says that you can. Get help right away if:  You develop sudden, severe back pain. If this happens, stop doing the exercises right away. Do not do them again unless your health care provider says that you can. This information is not intended to replace advice given to you by your health care provider. Make sure you discuss any questions you have with your health care provider. Document Released: 05/03/2004 Document Revised: 08/03/2015 Document Reviewed: 05/20/2014 Elsevier Interactive Patient Education  2017 [employer]

## 2017-09-30 NOTE — Progress Notes (Signed)
Subjective:    Patient ID: David Vincent, male    DOB: 02/03/1951, 67 y.o.   MRN: [id number]  HPI  67 year old patient was seen today for an annual preventive health examination as well as a subsequent Medicare wellness visit  He has a history of essential hypertension as well as dyslipidemia.  He has a history of mild nonobstructive coronary artery disease.  Denies any exertional chest pain.  Family history Father died at age 67 of an MI and secondary to renal failure mother died at 7089 complications of a hip fracture and pneumonia 2 sisters and 2 brothers.  A number of maternal aunts and uncles had a history of cerebral aneurysms  Past Medical History:  Diagnosis Date  . BACK PAIN 03/10/2007  . CAD (coronary artery disease)    A.  01/05/2002 Cath - nonobs dzs (LAD 20prox, RI 30-40 prox)  . ED (erectile dysfunction)   . GERD (gastroesophageal reflux disease)   . History of mumps orchitis   . HYPERTENSION 11/15/2006  . INGUINAL HERNIA, RIGHT 12/05/2009  . LEG CRAMPS 12/03/2008  . SLEEP APNEA 11/16/2008  . Unstable angina (HCC)   . VERTIGO, POSITIONAL 08/08/2009     Social History   Socioeconomic History  . Marital status: Married    Spouse name: Not on file  . Number of children: Not on file  . Years of education: Not on file  . Highest education level: Not on file  Occupational History  . Not on file  Social Needs  . Financial resource strain: Not on file  . Food insecurity:    Worry: Not on file    Inability: Not on file  . Transportation needs:    Medical: Not on file    Non-medical: Not on file  Tobacco Use  . Smoking status: Never Smoker  . Smokeless tobacco: Never Used  Substance and Sexual Activity  . Alcohol use: No  . Drug use: No  . Sexual activity: Not on file  Lifestyle  . Physical activity:    Days per week: Not on file    Minutes per session: Not on file  . Stress: Not on file  Relationships  . Social connections:    Talks on phone: Not on file    Gets together: Not on file    Attends religious service: Not on file    Active member of club or organization: Not on file    Attends meetings of clubs or organizations: Not on file    Relationship status: Not on file  . Intimate partner violence:    Fear of current or ex partner: Not on file    Emotionally abused: Not on file    Physically abused: Not on file    Forced sexual activity: Not on file  Other Topics Concern  . Not on file  Social History Narrative   05/02/2011:  Currently unemployed.  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  Does not exercise or adhere to any specific diet.    Past Surgical History:  Procedure Laterality Date  . CARDIAC CATHETERIZATION    . KNEE ARTHROSCOPY     Right    Family History  Problem Relation Age of Onset  . Pneumonia Mother        died 9988 - hip fx/pna  . Heart attack Father        died 4190  . Hypertension Sister        alive  .  Cancer Brother        died - agent orange exposure  . Hypertension Brother        alive    Allergies  Allergen Reactions  . Bee Venom Anaphylaxis  . Codeine Phosphate Itching  . Penicillins Other (See Comments)    Has patient had a PCN reaction causing immediate rash, facial/tongue/throat swelling, SOB or lightheadedness with hypotension: Unknown, childhood reaction Has patient had a PCN reaction causing severe rash involving mucus membranes or skin necrosis: No Has patient had a PCN reaction that required hospitalization No Has patient had a PCN reaction occurring within the last 10 years: No If all of the above answers are "NO", then may proceed with Cephalosporin use.   . Prednisone Itching    Current Outpatient Medications on File Prior to Visit  Medication Sig Dispense Refill  . acetaminophen (TYLENOL) 500 MG tablet Take 1,000 mg by mouth once.     Marland Kitchen atorvastatin (LIPITOR) 20 MG tablet TAKE 1 TABLET BY MOUTH ONCE DAILY 90 tablet 2  . losartan (COZAAR) 100 MG tablet TAKE 1  TABLET BY MOUTH ONCE DAILY 90 tablet 2  . meloxicam (MOBIC) 15 MG tablet TAKE 1 TABLET BY MOUTH ONCE A DAY WITH FOOD FOR INFLAMMATION/PAIN  0  . omeprazole (PRILOSEC) 20 MG capsule Take 20 mg by mouth every morning.    . traMADol (ULTRAM) 50 MG tablet Take 1 tablet (50 mg total) by mouth 3 (three) times daily. 90 tablet 2   No current facility-administered medications on file prior to visit.     BP 138/88 (BP Location: Right Arm, Patient Position: Sitting, Cuff Size: Large)   Pulse 74   Temp 98.1 F (36.7 C) (Oral)   Wt 270 lb (122.5 kg)   SpO2 98%   BMI 38.74 kg/m   Subsequent Medicare wellness visit  1. Risk factors, based on past  M,S,F history.  Patient has a history of nonobstructive coronary artery disease noted on a heart catheterization in 2003.  Denies any exertional chest pain.  He does have a history of hypertension and dyslipidemia  2.  Physical activities: Fairly sedentary but no exercise limitations.  He recently over the past several months is recovering from right foot surgery  3.  Depression/mood: No history major depression or mood disorder  4.  Hearing: No deficits  5.  ADL's: Independent  6.  Fall risk: Low  7.  Home safety: No problems identified  8.  Height weight, and visual acuity; height and weight stable no change in visual acuity  9.  Counseling: Patient has gained weight over the past year.  More rigorous exercise and weight loss encouraged  10. Lab orders based on risk factors: Laboratory update will be reviewed  11. Referral : None appropriate at this time  12. Care plan: Continue efforts at aggressive risk factor modification  13. Cognitive assessment: Alert and appropriate normal affect.  No cognitive dysfunction  14. Screening: Patient provided with a written and personalized 5-10 year screening schedule in the AVS. will need follow-up colonoscopy in 2021  15. Provider List Update: Ophthalmology primary care podiatry    Review of  Systems  Constitutional: Positive for unexpected weight change. Negative for appetite change, chills, fatigue and fever.  HENT: Negative for congestion, dental problem, ear pain, hearing loss, sore throat, tinnitus, trouble swallowing and voice change.   Eyes: Negative for pain, discharge and visual disturbance.  Respiratory: Negative for cough, chest tightness, wheezing and stridor.   Cardiovascular: Negative  for chest pain, palpitations and leg swelling.  Gastrointestinal: Negative for abdominal distention, abdominal pain, blood in stool, constipation, diarrhea, nausea and vomiting.  Genitourinary: Negative for difficulty urinating, discharge, flank pain, genital sores, hematuria and urgency.  Musculoskeletal: Negative for arthralgias, back pain, gait problem, joint swelling, myalgias and neck stiffness.  Skin: Negative for rash.  Neurological: Positive for numbness. Negative for dizziness, syncope, speech difficulty, weakness and headaches.       Some numbness involving the right great toe following foot surgery  Hematological: Negative for adenopathy. Does not bruise/bleed easily.  Psychiatric/Behavioral: Negative for behavioral problems and dysphoric mood. The patient is not nervous/anxious.        Objective:   Physical Exam  Constitutional: He appears well-developed and well-nourished.  Weight 270  HENT:  Head: Normocephalic and atraumatic.  Right Ear: External ear normal.  Left Ear: External ear normal.  Nose: Nose normal.  Mouth/Throat: Oropharynx is clear and moist.  Eyes: Pupils are equal, round, and reactive to light. Conjunctivae and EOM are normal. No scleral icterus.  Neck: Normal range of motion. Neck supple. No JVD present. No thyromegaly present.  Cardiovascular: Regular rhythm, normal heart sounds and intact distal pulses. Exam reveals no gallop and no friction rub.  No murmur heard. Pulmonary/Chest: Effort normal and breath sounds normal. He exhibits no tenderness.    Abdominal: Soft. Bowel sounds are normal. He exhibits no distension and no mass. There is no tenderness.  Genitourinary: Prostate normal and penis normal. Rectal exam shows guaiac negative stool.  Musculoskeletal: Normal range of motion. He exhibits no edema or tenderness.  Lymphadenopathy:    He has no cervical adenopathy.  Neurological: He is alert. He has normal reflexes. No cranial nerve deficit. Coordination normal.  Skin: Skin is warm and dry. No rash noted.  Psychiatric: He has a normal mood and affect. His behavior is normal.          Assessment & Plan:  Preventive health examination Subsequent Medicare wellness visit Nonobstructive coronary artery disease.  Will continue statin therapy and aggressive risk factor modification Essential hypertension stable History of OSA Gastrosoft reflux disease weight loss encouraged.  Patient will consider a trial off PPI therapy  Follow-up 6 months with new provider  [facility]

## 2017-10-01 LAB — HEPATITIS C ANTIBODY
Hepatitis C Ab: NONREACTIVE
SIGNAL TO CUT-OFF: 0.01

## 2017-10-29 ENCOUNTER — Ambulatory Visit (INDEPENDENT_AMBULATORY_CARE_PROVIDER_SITE_OTHER): Payer: PPO | Admitting: Adult Health

## 2017-10-29 ENCOUNTER — Encounter: Payer: Self-pay | Admitting: Adult Health

## 2017-10-29 VITALS — BP 140/70 | Temp 98.4°F | Wt 272.0 lb

## 2017-10-29 DIAGNOSIS — H6690 Otitis media, unspecified, unspecified ear: Secondary | ICD-10-CM

## 2017-10-29 MED ORDER — CEFDINIR 300 MG PO CAPS: 300.0000 mg | ORAL_CAPSULE | Freq: Two times a day (BID) | ORAL | 0 refills | Status: DC

## 2017-10-29 NOTE — Progress Notes (Signed)
Subjective:    Patient ID: David Vincent, male    DOB: 09/01/1950, 67 y.o.   MRN: [id number]  HPI 67 year old male who  has a past medical history of BACK PAIN (03/10/2007), CAD (coronary artery disease), ED (erectile dysfunction), GERD (gastroesophageal reflux disease), History of mumps orchitis, HYPERTENSION (11/15/2006), INGUINAL HERNIA, RIGHT (12/05/2009), LEG CRAMPS (12/03/2008), SLEEP APNEA (11/16/2008), Unstable angina (HCC), and VERTIGO, POSITIONAL (08/08/2009).  He is a patient of Dr. Kirtland BouchardK who I am seeing today for an acute issue of right pain. He reports pain and tenderness x 2 days with associated sinus pain and pressure. Denies any drainage or loss of hearing    Review of Systems See HPI   Past Medical History:  Diagnosis Date  . BACK PAIN 03/10/2007  . CAD (coronary artery disease)    A.  01/05/2002 Cath - nonobs dzs (LAD 20prox, RI 30-40 prox)  . ED (erectile dysfunction)   . GERD (gastroesophageal reflux disease)   . History of mumps orchitis   . HYPERTENSION 11/15/2006  . INGUINAL HERNIA, RIGHT 12/05/2009  . LEG CRAMPS 12/03/2008  . SLEEP APNEA 11/16/2008  . Unstable angina (HCC)   . VERTIGO, POSITIONAL 08/08/2009    Social History   Socioeconomic History  . Marital status: Married    Spouse name: Not on file  . Number of children: Not on file  . Years of education: Not on file  . Highest education level: Not on file  Occupational History  . Not on file  Social Needs  . Financial resource strain: Not on file  . Food insecurity:    Worry: Not on file    Inability: Not on file  . Transportation needs:    Medical: Not on file    Non-medical: Not on file  Tobacco Use  . Smoking status: Never Smoker  . Smokeless tobacco: Never Used  Substance and Sexual Activity  . Alcohol use: No  . Drug use: No  . Sexual activity: Not on file  Lifestyle  . Physical activity:    Days per week: Not on file    Minutes per session: Not on file  . Stress: Not on file  Relationships    . Social connections:    Talks on phone: Not on file    Gets together: Not on file    Attends religious service: Not on file    Active member of club or organization: Not on file    Attends meetings of clubs or organizations: Not on file    Relationship status: Not on file  . Intimate partner violence:    Fear of current or ex partner: Not on file    Emotionally abused: Not on file    Physically abused: Not on file    Forced sexual activity: Not on file  Other Topics Concern  . Not on file  Social History Narrative   05/02/2011:  Currently unemployed.  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  Does not exercise or adhere to any specific diet.    Past Surgical History:  Procedure Laterality Date  . CARDIAC CATHETERIZATION    . KNEE ARTHROSCOPY     Right    Family History  Problem Relation Age of Onset  . Pneumonia Mother        died 5988 - hip fx/pna  . Heart attack Father        died 5790  . Hypertension Sister  alive  . Cancer Brother        died - agent orange exposure  . Hypertension Brother        alive    Allergies  Allergen Reactions  . Bee Venom Anaphylaxis  . Codeine Phosphate Itching  . Penicillins Other (See Comments)    Has patient had a PCN reaction causing immediate rash, facial/tongue/throat swelling, SOB or lightheadedness with hypotension: Unknown, childhood reaction Has patient had a PCN reaction causing severe rash involving mucus membranes or skin necrosis: No Has patient had a PCN reaction that required hospitalization No Has patient had a PCN reaction occurring within the last 10 years: No If all of the above answers are "NO", then may proceed with Cephalosporin use.   . Prednisone Itching    Current Outpatient Medications on File Prior to Visit  Medication Sig Dispense Refill  . acetaminophen (TYLENOL) 500 MG tablet Take 1,000 mg by mouth once.     Marland Kitchen atorvastatin (LIPITOR) 20 MG tablet TAKE 1 TABLET BY MOUTH ONCE DAILY  90 tablet 2  . losartan (COZAAR) 100 MG tablet TAKE 1 TABLET BY MOUTH ONCE DAILY 90 tablet 2  . meloxicam (MOBIC) 15 MG tablet TAKE 1 TABLET BY MOUTH ONCE A DAY WITH FOOD FOR INFLAMMATION/PAIN  0  . omeprazole (PRILOSEC) 20 MG capsule Take 20 mg by mouth every morning.    . traMADol (ULTRAM) 50 MG tablet Take 1 tablet (50 mg total) by mouth 3 (three) times daily. 90 tablet 2   No current facility-administered medications on file prior to visit.     BP 140/70   Temp 98.4 F (36.9 C) (Oral)   Wt 272 lb (123.4 kg)   BMI 39.03 kg/m       Objective:   Physical Exam  Constitutional: He is oriented to person, place, and time. He appears well-developed and well-nourished. No distress.  HENT:  Right Ear: Hearing, external ear and ear canal normal. Tympanic membrane is erythematous and bulging. Tympanic membrane is not perforated. A middle ear effusion is present.  Left Ear: Hearing, external ear and ear canal normal. Tympanic membrane is not perforated, not erythematous and not bulging. A middle ear effusion is present.  Cardiovascular: Normal rate, regular rhythm, normal heart sounds and intact distal pulses.  Pulmonary/Chest: Effort normal and breath sounds normal.  Neurological: He is alert and oriented to person, place, and time.  Skin: Skin is warm and dry. He is not diaphoretic.  Psychiatric: He has a normal mood and affect. His behavior is normal. Judgment and thought content normal.  Nursing note and vitals reviewed.     Assessment & Plan:  1. Acute otitis media, unspecified otitis media type - Exam consistent with otitis media.  - cefdinir (OMNICEF) 300 MG capsule; Take 1 capsule (300 mg total) by mouth 2 (two) times daily.  Dispense: 14 capsule; Refill: 0 - follow up in 3-4 days if no improvement or sooner if symptoms become worse   [name], NP

## 2017-10-30 ENCOUNTER — Emergency Department (HOSPITAL_COMMUNITY)
Admission: EM | Admit: 2017-10-30 | Discharge: 2017-10-30 | Disposition: A | Discharge status: Home / Self Care | Payer: No Typology Code available for payment source | Attending: Emergency Medicine | Admitting: Emergency Medicine

## 2017-10-30 ENCOUNTER — Other Ambulatory Visit: Payer: Self-pay

## 2017-10-30 DIAGNOSIS — I2510 Atherosclerotic heart disease of native coronary artery without angina pectoris: Secondary | ICD-10-CM | POA: Insufficient documentation

## 2017-10-30 DIAGNOSIS — Z79899 Other long term (current) drug therapy: Secondary | ICD-10-CM | POA: Diagnosis not present

## 2017-10-30 DIAGNOSIS — I10 Essential (primary) hypertension: Secondary | ICD-10-CM | POA: Diagnosis not present

## 2017-10-30 DIAGNOSIS — T63481A Toxic effect of venom of other arthropod, accidental (unintentional), initial encounter: Secondary | ICD-10-CM

## 2017-10-30 DIAGNOSIS — T63461A Toxic effect of venom of wasps, accidental (unintentional), initial encounter: Secondary | ICD-10-CM | POA: Diagnosis present

## 2017-10-30 MED ORDER — DIPHENHYDRAMINE HCL 25 MG PO CAPS
25.0000 mg | ORAL_CAPSULE | Freq: Once | ORAL | Status: AC
  Administered 2017-10-30: 25 mg via ORAL
  Filled 2017-10-30: qty 1

## 2017-10-30 MED ORDER — PREDNISONE 20 MG PO TABS
60.0000 mg | ORAL_TABLET | Freq: Once | ORAL | Status: AC
  Administered 2017-10-30: 60 mg via ORAL
  Filled 2017-10-30: qty 3

## 2017-10-30 MED ORDER — FAMOTIDINE 20 MG PO TABS
20.0000 mg | ORAL_TABLET | Freq: Once | ORAL | Status: AC
  Administered 2017-10-30: 20 mg via ORAL
  Filled 2017-10-30: qty 1

## 2017-10-30 MED ORDER — EPINEPHRINE 0.3 MG/0.3ML IJ SOAJ: 0.3000 mg | Freq: Once | INTRAMUSCULAR | 6 refills | Status: DC

## 2017-10-30 MED ORDER — EPINEPHRINE 0.3 MG/0.3ML IJ SOAJ: 0.3000 mg | Freq: Once | INTRAMUSCULAR | 6 refills | Status: AC

## 2017-10-30 NOTE — ED Notes (Addendum)
Pt stable, ambulatory, and verbalizes understanding of d/c instructions.   Pt attempted to sign discharge, signature pad not working.

## 2017-10-30 NOTE — ED Triage Notes (Signed)
Pt states he was stung by a wasp and has hx of anaphylaxis from this, gave himself his epi pen. Pt has swelling to the right hand where he was stung.

## 2017-10-30 NOTE — ED Provider Notes (Signed)
[facility] EMERGENCY DEPARTMENT Provider Note   CSN: [id number] Arrival date & time: 10/30/17  9562     History   Chief Complaint No chief complaint on file.   HPI David Vincent is a 67 y.o. male.  The history is provided by the patient.  Allergic Reaction  Presenting symptoms: difficulty breathing, rash and swelling   Severity:  Moderate Prior allergic episodes:  Insect allergies Context: insect bite/sting   Relieved by:  Epinephrine Worsened by:  Nothing Ineffective treatments:  None tried   Past Medical History:  Diagnosis Date  . BACK PAIN 03/10/2007  . CAD (coronary artery disease)    A.  01/05/2002 Cath - nonobs dzs (LAD 20prox, RI 30-40 prox)  . ED (erectile dysfunction)   . GERD (gastroesophageal reflux disease)   . History of mumps orchitis   . HYPERTENSION 11/15/2006  . INGUINAL HERNIA, RIGHT 12/05/2009  . LEG CRAMPS 12/03/2008  . SLEEP APNEA 11/16/2008  . Unstable angina (HCC)   . VERTIGO, POSITIONAL 08/08/2009    Patient Active Problem List   Diagnosis Date Noted  . Low testosterone 08/24/2014  . Erectile dysfunction due to diseases classified elsewhere 08/24/2014  . Multiple fractures of ribs of left side 01/01/2012  . GERD (gastroesophageal reflux disease) 05/03/2011  . CAD (coronary artery disease)   . Unstable angina (HCC)   . INGUINAL HERNIA, RIGHT 12/05/2009  . VERTIGO, POSITIONAL 08/08/2009  . Sleep apnea 11/16/2008  . Essential hypertension 11/15/2006    Past Surgical History:  Procedure Laterality Date  . CARDIAC CATHETERIZATION    . KNEE ARTHROSCOPY     Right        Home Medications    Prior to Admission medications   Medication Sig Start Date End Date Taking? Authorizing Provider  acetaminophen (TYLENOL) 500 MG tablet Take 1,000 mg by mouth every 6 (six) hours as needed for moderate pain.    Yes [provider]  atorvastatin (LIPITOR) 20 MG tablet TAKE 1 TABLET BY MOUTH ONCE DAILY 08/07/17  Yes  Gordy Savers, MD  cefdinir (OMNICEF) 300 MG capsule Take 1 capsule (300 mg total) by mouth 2 (two) times daily. 10/29/17  Yes Nafziger, Kandee Keen, NP  losartan (COZAAR) 100 MG tablet TAKE 1 TABLET BY MOUTH ONCE DAILY 03/07/17  Yes Gordy Savers, MD  omeprazole (PRILOSEC) 20 MG capsule Take 20 mg by mouth every morning.   Yes [provider]  EPINEPHrine 0.3 mg/0.3 mL IJ SOAJ injection Inject 0.3 mLs (0.3 mg total) into the muscle once for 1 dose. 10/30/17 10/30/17  Zhamir Pirro, Barbara Cower, MD    Family History Family History  Problem Relation Age of Onset  . Pneumonia Mother        died 25 - hip fx/pna  . Heart attack Father        died 4  . Hypertension Sister        alive  . Cancer Brother        died - agent orange exposure  . Hypertension Brother        alive    Social History Social History   Tobacco Use  . Smoking status: Never Smoker  . Smokeless tobacco: Never Used  Substance Use Topics  . Alcohol use: No  . Drug use: No     Allergies   Bee venom; Codeine phosphate; Penicillins; and Prednisone   Review of Systems Review of Systems  Skin: Positive for rash.  All other systems reviewed and are negative.  Physical Exam Updated Vital Signs BP 123/78   Pulse 68   Temp 98.5 F (36.9 C) (Oral)   Resp 18   SpO2 96%   Physical Exam  Constitutional: He is oriented to person, place, and time. He appears well-developed and well-nourished.  HENT:  Head: Normocephalic and atraumatic.  Eyes: Conjunctivae and EOM are normal.  Neck: Normal range of motion.  Cardiovascular: Normal rate.  Pulmonary/Chest: Effort normal. No respiratory distress.  Abdominal: Soft. He exhibits no distension.  Musculoskeletal: Normal range of motion. He exhibits no edema or deformity.  Neurological: He is alert and oriented to person, place, and time. No cranial nerve deficit. Coordination normal.  Skin: Skin is warm and dry.  Nursing note and vitals reviewed.    ED  Treatments / Results  Labs (all labs ordered are listed, but only abnormal results are displayed) Labs Reviewed - No data to display  EKG None  Radiology No results found.  Procedures Procedures (including critical care time)  Medications Ordered in ED Medications  predniSONE (DELTASONE) tablet 60 mg (60 mg Oral Given 10/30/17 1104)  diphenhydrAMINE (BENADRYL) capsule 25 mg (25 mg Oral Given 10/30/17 1104)  famotidine (PEPCID) tablet 20 mg (20 mg Oral Given 10/30/17 1103)     Initial Impression / Assessment and Plan / ED Course  I have reviewed the triage vital signs and the nursing notes.  Pertinent labs & imaging results that were available during my care of the patient were reviewed by me and considered in my medical decision making (see chart for details).     Anaphylaxis, epi administered prior to arrival. Clear lung sounds, no swelling now. obs for 4 hours, no rebound. Epi refilled. Stable for dc.   Final Clinical Impressions(s) / ED Diagnoses   Final diagnoses:  Insect stings, accidental or unintentional, initial encounter    ED Discharge Orders        Ordered    EPINEPHrine 0.3 mg/0.3 mL IJ SOAJ injection   Once,   Status:  Discontinued     10/30/17 1406    EPINEPHrine 0.3 mg/0.3 mL IJ SOAJ injection   Once     10/30/17 1408       Orvella Digiulio, Barbara CowerJason, MD 10/30/17 1704

## 2017-11-05 ENCOUNTER — Encounter: Payer: Self-pay | Admitting: Adult Health

## 2017-11-05 ENCOUNTER — Ambulatory Visit (INDEPENDENT_AMBULATORY_CARE_PROVIDER_SITE_OTHER): Payer: PPO | Admitting: Adult Health

## 2017-11-05 VITALS — BP 128/70 | Temp 98.4°F | Wt 270.0 lb

## 2017-11-05 DIAGNOSIS — H60331 Swimmer's ear, right ear: Secondary | ICD-10-CM | POA: Diagnosis not present

## 2017-11-05 MED ORDER — CIPROFLOXACIN-DEXAMETHASONE 0.3-0.1 % OT SUSP: OTIC | 0 refills | Status: DC

## 2017-11-05 NOTE — Progress Notes (Signed)
Subjective:    Patient ID: David Vincent, male    DOB: 1951-01-13, 67 y.o.   MRN: [id number]  HPI 67 year old male who  has a past medical history of BACK PAIN (03/10/2007), CAD (coronary artery disease), ED (erectile dysfunction), GERD (gastroesophageal reflux disease), History of mumps orchitis, HYPERTENSION (11/15/2006), INGUINAL HERNIA, RIGHT (12/05/2009), LEG CRAMPS (12/03/2008), SLEEP APNEA (11/16/2008), Unstable angina (HCC), and VERTIGO, POSITIONAL (08/08/2009).  He presents to the office today for follow up regarding right ear pain. I saw him one week ago and treated him with Omnicef 300 mg BID x 7 days for suspected otitis media. Today in the office he reports that he has finished his antibiotics but he reports that he continues to have pain in his right ear. Associated symptoms include a headache. Denies blurred vision or discharge from right ear.    Review of Systems See HPI   Past Medical History:  Diagnosis Date  . BACK PAIN 03/10/2007  . CAD (coronary artery disease)    A.  01/05/2002 Cath - nonobs dzs (LAD 20prox, RI 30-40 prox)  . ED (erectile dysfunction)   . GERD (gastroesophageal reflux disease)   . History of mumps orchitis   . HYPERTENSION 11/15/2006  . INGUINAL HERNIA, RIGHT 12/05/2009  . LEG CRAMPS 12/03/2008  . SLEEP APNEA 11/16/2008  . Unstable angina (HCC)   . VERTIGO, POSITIONAL 08/08/2009    Social History   Socioeconomic History  . Marital status: Married    Spouse name: Not on file  . Number of children: Not on file  . Years of education: Not on file  . Highest education level: Not on file  Occupational History  . Not on file  Social Needs  . Financial resource strain: Not on file  . Food insecurity:    Worry: Not on file    Inability: Not on file  . Transportation needs:    Medical: Not on file    Non-medical: Not on file  Tobacco Use  . Smoking status: Never Smoker  . Smokeless tobacco: Never Used  Substance and Sexual Activity  . Alcohol use: No   . Drug use: No  . Sexual activity: Not on file  Lifestyle  . Physical activity:    Days per week: Not on file    Minutes per session: Not on file  . Stress: Not on file  Relationships  . Social connections:    Talks on phone: Not on file    Gets together: Not on file    Attends religious service: Not on file    Active member of club or organization: Not on file    Attends meetings of clubs or organizations: Not on file    Relationship status: Not on file  . Intimate partner violence:    Fear of current or ex partner: Not on file    Emotionally abused: Not on file    Physically abused: Not on file    Forced sexual activity: Not on file  Other Topics Concern  . Not on file  Social History Narrative   05/02/2011:  Currently unemployed.  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  Does not exercise or adhere to any specific diet.    Past Surgical History:  Procedure Laterality Date  . CARDIAC CATHETERIZATION    . KNEE ARTHROSCOPY     Right    Family History  Problem Relation Age of Onset  . Pneumonia Mother  died 6288 - hip fx/pna  . Heart attack Father        died 2790  . Hypertension Sister        alive  . Cancer Brother        died - agent orange exposure  . Hypertension Brother        alive    Allergies  Allergen Reactions  . Bee Venom Anaphylaxis  . Codeine Phosphate Itching  . Penicillins Other (See Comments)    Has patient had a PCN reaction causing immediate rash, facial/tongue/throat swelling, SOB or lightheadedness with hypotension: Unknown, childhood reaction Has patient had a PCN reaction causing severe rash involving mucus membranes or skin necrosis: No Has patient had a PCN reaction that required hospitalization No Has patient had a PCN reaction occurring within the last 10 years: No If all of the above answers are "NO", then may proceed with Cephalosporin use.   . Prednisone Itching    Current Outpatient Medications on File  Prior to Visit  Medication Sig Dispense Refill  . acetaminophen (TYLENOL) 500 MG tablet Take 1,000 mg by mouth every 6 (six) hours as needed for moderate pain.     Marland Kitchen. atorvastatin (LIPITOR) 20 MG tablet TAKE 1 TABLET BY MOUTH ONCE DAILY 90 tablet 2  . EPINEPHrine 0.3 mg/0.3 mL IJ SOAJ injection     . losartan (COZAAR) 100 MG tablet TAKE 1 TABLET BY MOUTH ONCE DAILY 90 tablet 2  . omeprazole (PRILOSEC) 20 MG capsule Take 20 mg by mouth every morning.     No current facility-administered medications on file prior to visit.     BP 128/70   Temp 98.4 F (36.9 C) (Oral)   Wt 270 lb (122.5 kg)   BMI 38.74 kg/m       Objective:   Physical Exam  Constitutional: He is oriented to person, place, and time. He appears well-developed and well-nourished. No distress.  HENT:  Head: Normocephalic and atraumatic.  Right Ear: Hearing normal. There is swelling and tenderness.  Left Ear: Hearing and external ear normal.  Nose: Nose normal.  Mouth/Throat: Oropharynx is clear and moist. No oropharyngeal exudate.  Eyes: Pupils are equal, round, and reactive to light. EOM are normal. Right eye exhibits no discharge. Left eye exhibits no discharge.  Redness and swelling noted in right canal. Unable to visualize TM completely.   Cardiovascular: Normal rate and regular rhythm.  Pulmonary/Chest: Effort normal and breath sounds normal.  Neurological: He is alert and oriented to person, place, and time.  Skin: Skin is warm. He is not diaphoretic.  Psychiatric: He has a normal mood and affect. His behavior is normal. Thought content normal.  Vitals reviewed.     Assessment & Plan:  1. Acute swimmer's ear of right side - ciprofloxacin-dexamethasone (CIPRODEX) OTIC suspension; Apply 4 drops to the affected ear(s) twice daily x 7 days  Dispense: 7.5 mL; Refill: 0 - Follow up if no improvement in the next 2-3 days  - Consider referral to ENT   Shirline Freesory Vail Vuncannon, NP

## 2017-11-15 ENCOUNTER — Encounter: Payer: Self-pay | Admitting: Adult Health

## 2017-11-15 ENCOUNTER — Ambulatory Visit (INDEPENDENT_AMBULATORY_CARE_PROVIDER_SITE_OTHER): Payer: PPO | Admitting: Adult Health

## 2017-11-15 VITALS — BP 120/70 | Temp 98.1°F | Wt 271.0 lb

## 2017-11-15 DIAGNOSIS — H9201 Otalgia, right ear: Secondary | ICD-10-CM

## 2017-11-15 NOTE — Progress Notes (Signed)
Subjective:    Patient ID: David Vincent, male    DOB: 03/12/1951, 67 y.o.   MRN: [id number]  HPI  67 year old male who  has a past medical history of BACK PAIN (03/10/2007), CAD (coronary artery disease), ED (erectile dysfunction), GERD (gastroesophageal reflux disease), History of mumps orchitis, HYPERTENSION (11/15/2006), INGUINAL HERNIA, RIGHT (12/05/2009), LEG CRAMPS (12/03/2008), SLEEP APNEA (11/16/2008), Unstable angina (HCC), and VERTIGO, POSITIONAL (08/08/2009).  Follow-up for right ear pain.  Seen originally on 10/29/2017 for suspected otitis media and was prescribed Omnicef 300 mg twice daily 7 days.  Had no improvement and returned on 11/05/2017 continued ear pain.  During this examination appeared to have acute swimmer's ear on the right side.  He was prescribed Ciprodex.  Today he returns with continued right ear pain.  Has been administering Ciprodex and reports increased pain when drops are placed in the right ear.  Denies any fevers or feeling acutely ill.  Pain is worse when drops are applied and when sleeping on the right side.  Review of Systems See HPI   Past Medical History:  Diagnosis Date  . BACK PAIN 03/10/2007  . CAD (coronary artery disease)    A.  01/05/2002 Cath - nonobs dzs (LAD 20prox, RI 30-40 prox)  . ED (erectile dysfunction)   . GERD (gastroesophageal reflux disease)   . History of mumps orchitis   . HYPERTENSION 11/15/2006  . INGUINAL HERNIA, RIGHT 12/05/2009  . LEG CRAMPS 12/03/2008  . SLEEP APNEA 11/16/2008  . Unstable angina (HCC)   . VERTIGO, POSITIONAL 08/08/2009    Social History   Socioeconomic History  . Marital status: Married    Spouse name: Not on file  . Number of children: Not on file  . Years of education: Not on file  . Highest education level: Not on file  Occupational History  . Not on file  Social Needs  . Financial resource strain: Not on file  . Food insecurity:    Worry: Not on file    Inability: Not on file  . Transportation  needs:    Medical: Not on file    Non-medical: Not on file  Tobacco Use  . Smoking status: Never Smoker  . Smokeless tobacco: Never Used  Substance and Sexual Activity  . Alcohol use: No  . Drug use: No  . Sexual activity: Not on file  Lifestyle  . Physical activity:    Days per week: Not on file    Minutes per session: Not on file  . Stress: Not on file  Relationships  . Social connections:    Talks on phone: Not on file    Gets together: Not on file    Attends religious service: Not on file    Active member of club or organization: Not on file    Attends meetings of clubs or organizations: Not on file    Relationship status: Not on file  . Intimate partner violence:    Fear of current or ex partner: Not on file    Emotionally abused: Not on file    Physically abused: Not on file    Forced sexual activity: Not on file  Other Topics Concern  . Not on file  Social History Narrative   05/02/2011:  Currently unemployed.  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  Does not exercise or adhere to any specific diet.    Past Surgical History:  Procedure Laterality Date  . CARDIAC  CATHETERIZATION    . KNEE ARTHROSCOPY     Right    Family History  Problem Relation Age of Onset  . Pneumonia Mother        died 67 - hip fx/pna  . Heart attack Father        died 19  . Hypertension Sister        alive  . Cancer Brother        died - agent orange exposure  . Hypertension Brother        alive    Allergies  Allergen Reactions  . Bee Venom Anaphylaxis  . Codeine Phosphate Itching  . Penicillins Other (See Comments)    Has patient had a PCN reaction causing immediate rash, facial/tongue/throat swelling, SOB or lightheadedness with hypotension: Unknown, childhood reaction Has patient had a PCN reaction causing severe rash involving mucus membranes or skin necrosis: No Has patient had a PCN reaction that required hospitalization No Has patient had a PCN  reaction occurring within the last 10 years: No If all of the above answers are "NO", then may proceed with Cephalosporin use.   . Prednisone Itching    Current Outpatient Medications on File Prior to Visit  Medication Sig Dispense Refill  . acetaminophen (TYLENOL) 500 MG tablet Take 1,000 mg by mouth every 6 (six) hours as needed for moderate pain.     Marland Kitchen atorvastatin (LIPITOR) 20 MG tablet TAKE 1 TABLET BY MOUTH ONCE DAILY 90 tablet 2  . ciprofloxacin-dexamethasone (CIPRODEX) OTIC suspension Apply 4 drops to the affected ear(s) twice daily x 7 days 7.5 mL 0  . EPINEPHrine 0.3 mg/0.3 mL IJ SOAJ injection     . losartan (COZAAR) 100 MG tablet TAKE 1 TABLET BY MOUTH ONCE DAILY 90 tablet 2  . omeprazole (PRILOSEC) 20 MG capsule Take 20 mg by mouth every morning.     No current facility-administered medications on file prior to visit.     BP 120/70   Temp 98.1 F (36.7 C) (Oral)   Wt 271 lb (122.9 kg)   BMI 38.88 kg/m       Objective:   Physical Exam  Constitutional: He is oriented to person, place, and time. He appears well-developed and well-nourished. No distress.  HENT:  Right Ear: Hearing, tympanic membrane and ear canal normal. There is tenderness. No mastoid tenderness. Tympanic membrane is not injected, not retracted and not bulging. No middle ear effusion. No hemotympanum.  Left Ear: Hearing, tympanic membrane and ear canal normal. No tenderness. No mastoid tenderness. Tympanic membrane is not injected, not retracted and not bulging.  No middle ear effusion. No hemotympanum.  Cardiovascular: Normal rate, regular rhythm, normal heart sounds and intact distal pulses.  Pulmonary/Chest: Effort normal and breath sounds normal.  Neurological: He is alert and oriented to person, place, and time.  Skin: Skin is warm and dry. He is not diaphoretic.  Nursing note and vitals reviewed.     Assessment & Plan:  1. Right ear pain -Endorses pain when speculum was inserted in the ear  canal.  Right ear canal completely normal no signs of infection.  Tympanic membrane completely normal no middle ear effusion, redness, or bulging TM.  He did not have any mastoid tenderness.  Unsure of what is causing his pain at this time.  We will send to ear nose and throat for further evaluation - Ambulatory referral to ENT  [name], NP

## 2017-11-21 DIAGNOSIS — M2669 Other specified disorders of temporomandibular joint: Secondary | ICD-10-CM | POA: Diagnosis not present

## 2017-11-21 DIAGNOSIS — H9201 Otalgia, right ear: Secondary | ICD-10-CM | POA: Diagnosis not present

## 2017-11-26 DIAGNOSIS — R51 Headache: Secondary | ICD-10-CM | POA: Diagnosis not present

## 2017-11-26 DIAGNOSIS — Q72891 Other reduction defects of right lower limb: Secondary | ICD-10-CM | POA: Diagnosis not present

## 2017-11-26 DIAGNOSIS — M9905 Segmental and somatic dysfunction of pelvic region: Secondary | ICD-10-CM | POA: Diagnosis not present

## 2017-11-26 DIAGNOSIS — M9901 Segmental and somatic dysfunction of cervical region: Secondary | ICD-10-CM | POA: Diagnosis not present

## 2017-11-26 DIAGNOSIS — M9903 Segmental and somatic dysfunction of lumbar region: Secondary | ICD-10-CM | POA: Diagnosis not present

## 2017-11-26 DIAGNOSIS — M531 Cervicobrachial syndrome: Secondary | ICD-10-CM | POA: Diagnosis not present

## 2017-11-26 DIAGNOSIS — M50321 Other cervical disc degeneration at C4-C5 level: Secondary | ICD-10-CM | POA: Diagnosis not present

## 2017-11-26 DIAGNOSIS — M9904 Segmental and somatic dysfunction of sacral region: Secondary | ICD-10-CM | POA: Diagnosis not present

## 2017-11-26 DIAGNOSIS — M9902 Segmental and somatic dysfunction of thoracic region: Secondary | ICD-10-CM | POA: Diagnosis not present

## 2017-11-26 DIAGNOSIS — M5137 Other intervertebral disc degeneration, lumbosacral region: Secondary | ICD-10-CM | POA: Diagnosis not present

## 2017-11-26 DIAGNOSIS — M50322 Other cervical disc degeneration at C5-C6 level: Secondary | ICD-10-CM | POA: Diagnosis not present

## 2017-11-26 DIAGNOSIS — M5417 Radiculopathy, lumbosacral region: Secondary | ICD-10-CM | POA: Diagnosis not present

## 2017-11-27 DIAGNOSIS — Q72891 Other reduction defects of right lower limb: Secondary | ICD-10-CM | POA: Diagnosis not present

## 2017-11-27 DIAGNOSIS — M5137 Other intervertebral disc degeneration, lumbosacral region: Secondary | ICD-10-CM | POA: Diagnosis not present

## 2017-11-27 DIAGNOSIS — M9901 Segmental and somatic dysfunction of cervical region: Secondary | ICD-10-CM | POA: Diagnosis not present

## 2017-11-27 DIAGNOSIS — M9905 Segmental and somatic dysfunction of pelvic region: Secondary | ICD-10-CM | POA: Diagnosis not present

## 2017-11-27 DIAGNOSIS — M9902 Segmental and somatic dysfunction of thoracic region: Secondary | ICD-10-CM | POA: Diagnosis not present

## 2017-11-27 DIAGNOSIS — M50321 Other cervical disc degeneration at C4-C5 level: Secondary | ICD-10-CM | POA: Diagnosis not present

## 2017-11-27 DIAGNOSIS — M531 Cervicobrachial syndrome: Secondary | ICD-10-CM | POA: Diagnosis not present

## 2017-11-27 DIAGNOSIS — M9903 Segmental and somatic dysfunction of lumbar region: Secondary | ICD-10-CM | POA: Diagnosis not present

## 2017-11-27 DIAGNOSIS — M50322 Other cervical disc degeneration at C5-C6 level: Secondary | ICD-10-CM | POA: Diagnosis not present

## 2017-11-27 DIAGNOSIS — M5417 Radiculopathy, lumbosacral region: Secondary | ICD-10-CM | POA: Diagnosis not present

## 2017-11-27 DIAGNOSIS — R51 Headache: Secondary | ICD-10-CM | POA: Diagnosis not present

## 2017-11-27 DIAGNOSIS — M9904 Segmental and somatic dysfunction of sacral region: Secondary | ICD-10-CM | POA: Diagnosis not present

## 2017-11-28 DIAGNOSIS — M531 Cervicobrachial syndrome: Secondary | ICD-10-CM | POA: Diagnosis not present

## 2017-11-28 DIAGNOSIS — Q72891 Other reduction defects of right lower limb: Secondary | ICD-10-CM | POA: Diagnosis not present

## 2017-11-28 DIAGNOSIS — R51 Headache: Secondary | ICD-10-CM | POA: Diagnosis not present

## 2017-11-28 DIAGNOSIS — M9905 Segmental and somatic dysfunction of pelvic region: Secondary | ICD-10-CM | POA: Diagnosis not present

## 2017-11-28 DIAGNOSIS — M9904 Segmental and somatic dysfunction of sacral region: Secondary | ICD-10-CM | POA: Diagnosis not present

## 2017-11-28 DIAGNOSIS — M50321 Other cervical disc degeneration at C4-C5 level: Secondary | ICD-10-CM | POA: Diagnosis not present

## 2017-11-28 DIAGNOSIS — M50322 Other cervical disc degeneration at C5-C6 level: Secondary | ICD-10-CM | POA: Diagnosis not present

## 2017-11-28 DIAGNOSIS — M9901 Segmental and somatic dysfunction of cervical region: Secondary | ICD-10-CM | POA: Diagnosis not present

## 2017-11-28 DIAGNOSIS — M9902 Segmental and somatic dysfunction of thoracic region: Secondary | ICD-10-CM | POA: Diagnosis not present

## 2017-11-28 DIAGNOSIS — M9903 Segmental and somatic dysfunction of lumbar region: Secondary | ICD-10-CM | POA: Diagnosis not present

## 2017-11-28 DIAGNOSIS — M5137 Other intervertebral disc degeneration, lumbosacral region: Secondary | ICD-10-CM | POA: Diagnosis not present

## 2017-11-28 DIAGNOSIS — M5417 Radiculopathy, lumbosacral region: Secondary | ICD-10-CM | POA: Diagnosis not present

## 2017-12-02 DIAGNOSIS — R51 Headache: Secondary | ICD-10-CM | POA: Diagnosis not present

## 2017-12-02 DIAGNOSIS — M5417 Radiculopathy, lumbosacral region: Secondary | ICD-10-CM | POA: Diagnosis not present

## 2017-12-02 DIAGNOSIS — M9903 Segmental and somatic dysfunction of lumbar region: Secondary | ICD-10-CM | POA: Diagnosis not present

## 2017-12-02 DIAGNOSIS — M531 Cervicobrachial syndrome: Secondary | ICD-10-CM | POA: Diagnosis not present

## 2017-12-02 DIAGNOSIS — M9901 Segmental and somatic dysfunction of cervical region: Secondary | ICD-10-CM | POA: Diagnosis not present

## 2017-12-02 DIAGNOSIS — M50322 Other cervical disc degeneration at C5-C6 level: Secondary | ICD-10-CM | POA: Diagnosis not present

## 2017-12-02 DIAGNOSIS — Q72891 Other reduction defects of right lower limb: Secondary | ICD-10-CM | POA: Diagnosis not present

## 2017-12-02 DIAGNOSIS — M5137 Other intervertebral disc degeneration, lumbosacral region: Secondary | ICD-10-CM | POA: Diagnosis not present

## 2017-12-02 DIAGNOSIS — M50321 Other cervical disc degeneration at C4-C5 level: Secondary | ICD-10-CM | POA: Diagnosis not present

## 2017-12-02 DIAGNOSIS — M9904 Segmental and somatic dysfunction of sacral region: Secondary | ICD-10-CM | POA: Diagnosis not present

## 2017-12-02 DIAGNOSIS — M9905 Segmental and somatic dysfunction of pelvic region: Secondary | ICD-10-CM | POA: Diagnosis not present

## 2017-12-02 DIAGNOSIS — M9902 Segmental and somatic dysfunction of thoracic region: Secondary | ICD-10-CM | POA: Diagnosis not present

## 2017-12-04 DIAGNOSIS — M9901 Segmental and somatic dysfunction of cervical region: Secondary | ICD-10-CM | POA: Diagnosis not present

## 2017-12-04 DIAGNOSIS — M50322 Other cervical disc degeneration at C5-C6 level: Secondary | ICD-10-CM | POA: Diagnosis not present

## 2017-12-04 DIAGNOSIS — R51 Headache: Secondary | ICD-10-CM | POA: Diagnosis not present

## 2017-12-04 DIAGNOSIS — M5417 Radiculopathy, lumbosacral region: Secondary | ICD-10-CM | POA: Diagnosis not present

## 2017-12-04 DIAGNOSIS — M50321 Other cervical disc degeneration at C4-C5 level: Secondary | ICD-10-CM | POA: Diagnosis not present

## 2017-12-04 DIAGNOSIS — M9905 Segmental and somatic dysfunction of pelvic region: Secondary | ICD-10-CM | POA: Diagnosis not present

## 2017-12-04 DIAGNOSIS — M9902 Segmental and somatic dysfunction of thoracic region: Secondary | ICD-10-CM | POA: Diagnosis not present

## 2017-12-04 DIAGNOSIS — M531 Cervicobrachial syndrome: Secondary | ICD-10-CM | POA: Diagnosis not present

## 2017-12-04 DIAGNOSIS — M9903 Segmental and somatic dysfunction of lumbar region: Secondary | ICD-10-CM | POA: Diagnosis not present

## 2017-12-04 DIAGNOSIS — M9904 Segmental and somatic dysfunction of sacral region: Secondary | ICD-10-CM | POA: Diagnosis not present

## 2017-12-04 DIAGNOSIS — Q72891 Other reduction defects of right lower limb: Secondary | ICD-10-CM | POA: Diagnosis not present

## 2017-12-04 DIAGNOSIS — M5137 Other intervertebral disc degeneration, lumbosacral region: Secondary | ICD-10-CM | POA: Diagnosis not present

## 2017-12-05 DIAGNOSIS — M531 Cervicobrachial syndrome: Secondary | ICD-10-CM | POA: Diagnosis not present

## 2017-12-05 DIAGNOSIS — Q72891 Other reduction defects of right lower limb: Secondary | ICD-10-CM | POA: Diagnosis not present

## 2017-12-05 DIAGNOSIS — M9902 Segmental and somatic dysfunction of thoracic region: Secondary | ICD-10-CM | POA: Diagnosis not present

## 2017-12-05 DIAGNOSIS — M9901 Segmental and somatic dysfunction of cervical region: Secondary | ICD-10-CM | POA: Diagnosis not present

## 2017-12-05 DIAGNOSIS — R51 Headache: Secondary | ICD-10-CM | POA: Diagnosis not present

## 2017-12-05 DIAGNOSIS — M50321 Other cervical disc degeneration at C4-C5 level: Secondary | ICD-10-CM | POA: Diagnosis not present

## 2017-12-05 DIAGNOSIS — M9904 Segmental and somatic dysfunction of sacral region: Secondary | ICD-10-CM | POA: Diagnosis not present

## 2017-12-05 DIAGNOSIS — M5137 Other intervertebral disc degeneration, lumbosacral region: Secondary | ICD-10-CM | POA: Diagnosis not present

## 2017-12-05 DIAGNOSIS — M5417 Radiculopathy, lumbosacral region: Secondary | ICD-10-CM | POA: Diagnosis not present

## 2017-12-05 DIAGNOSIS — M9903 Segmental and somatic dysfunction of lumbar region: Secondary | ICD-10-CM | POA: Diagnosis not present

## 2017-12-05 DIAGNOSIS — M9905 Segmental and somatic dysfunction of pelvic region: Secondary | ICD-10-CM | POA: Diagnosis not present

## 2017-12-05 DIAGNOSIS — M50322 Other cervical disc degeneration at C5-C6 level: Secondary | ICD-10-CM | POA: Diagnosis not present

## 2017-12-10 DIAGNOSIS — M9902 Segmental and somatic dysfunction of thoracic region: Secondary | ICD-10-CM | POA: Diagnosis not present

## 2017-12-10 DIAGNOSIS — M531 Cervicobrachial syndrome: Secondary | ICD-10-CM | POA: Diagnosis not present

## 2017-12-10 DIAGNOSIS — M9903 Segmental and somatic dysfunction of lumbar region: Secondary | ICD-10-CM | POA: Diagnosis not present

## 2017-12-10 DIAGNOSIS — M5137 Other intervertebral disc degeneration, lumbosacral region: Secondary | ICD-10-CM | POA: Diagnosis not present

## 2017-12-10 DIAGNOSIS — M9901 Segmental and somatic dysfunction of cervical region: Secondary | ICD-10-CM | POA: Diagnosis not present

## 2017-12-10 DIAGNOSIS — R51 Headache: Secondary | ICD-10-CM | POA: Diagnosis not present

## 2017-12-10 DIAGNOSIS — M9904 Segmental and somatic dysfunction of sacral region: Secondary | ICD-10-CM | POA: Diagnosis not present

## 2017-12-10 DIAGNOSIS — M50321 Other cervical disc degeneration at C4-C5 level: Secondary | ICD-10-CM | POA: Diagnosis not present

## 2017-12-10 DIAGNOSIS — M5417 Radiculopathy, lumbosacral region: Secondary | ICD-10-CM | POA: Diagnosis not present

## 2017-12-10 DIAGNOSIS — M9905 Segmental and somatic dysfunction of pelvic region: Secondary | ICD-10-CM | POA: Diagnosis not present

## 2017-12-10 DIAGNOSIS — Q72891 Other reduction defects of right lower limb: Secondary | ICD-10-CM | POA: Diagnosis not present

## 2017-12-10 DIAGNOSIS — M50322 Other cervical disc degeneration at C5-C6 level: Secondary | ICD-10-CM | POA: Diagnosis not present

## 2017-12-12 DIAGNOSIS — M9905 Segmental and somatic dysfunction of pelvic region: Secondary | ICD-10-CM | POA: Diagnosis not present

## 2017-12-12 DIAGNOSIS — M9903 Segmental and somatic dysfunction of lumbar region: Secondary | ICD-10-CM | POA: Diagnosis not present

## 2017-12-12 DIAGNOSIS — Q72891 Other reduction defects of right lower limb: Secondary | ICD-10-CM | POA: Diagnosis not present

## 2017-12-12 DIAGNOSIS — M5137 Other intervertebral disc degeneration, lumbosacral region: Secondary | ICD-10-CM | POA: Diagnosis not present

## 2017-12-12 DIAGNOSIS — M9902 Segmental and somatic dysfunction of thoracic region: Secondary | ICD-10-CM | POA: Diagnosis not present

## 2017-12-12 DIAGNOSIS — M5417 Radiculopathy, lumbosacral region: Secondary | ICD-10-CM | POA: Diagnosis not present

## 2017-12-12 DIAGNOSIS — M531 Cervicobrachial syndrome: Secondary | ICD-10-CM | POA: Diagnosis not present

## 2017-12-12 DIAGNOSIS — M50321 Other cervical disc degeneration at C4-C5 level: Secondary | ICD-10-CM | POA: Diagnosis not present

## 2017-12-12 DIAGNOSIS — M9904 Segmental and somatic dysfunction of sacral region: Secondary | ICD-10-CM | POA: Diagnosis not present

## 2017-12-12 DIAGNOSIS — M50322 Other cervical disc degeneration at C5-C6 level: Secondary | ICD-10-CM | POA: Diagnosis not present

## 2017-12-12 DIAGNOSIS — R51 Headache: Secondary | ICD-10-CM | POA: Diagnosis not present

## 2017-12-12 DIAGNOSIS — M9901 Segmental and somatic dysfunction of cervical region: Secondary | ICD-10-CM | POA: Diagnosis not present

## 2017-12-18 DIAGNOSIS — M50322 Other cervical disc degeneration at C5-C6 level: Secondary | ICD-10-CM | POA: Diagnosis not present

## 2017-12-18 DIAGNOSIS — M50321 Other cervical disc degeneration at C4-C5 level: Secondary | ICD-10-CM | POA: Diagnosis not present

## 2017-12-18 DIAGNOSIS — M9904 Segmental and somatic dysfunction of sacral region: Secondary | ICD-10-CM | POA: Diagnosis not present

## 2017-12-18 DIAGNOSIS — M531 Cervicobrachial syndrome: Secondary | ICD-10-CM | POA: Diagnosis not present

## 2017-12-18 DIAGNOSIS — M5137 Other intervertebral disc degeneration, lumbosacral region: Secondary | ICD-10-CM | POA: Diagnosis not present

## 2017-12-18 DIAGNOSIS — R51 Headache: Secondary | ICD-10-CM | POA: Diagnosis not present

## 2017-12-18 DIAGNOSIS — M5417 Radiculopathy, lumbosacral region: Secondary | ICD-10-CM | POA: Diagnosis not present

## 2017-12-18 DIAGNOSIS — M9901 Segmental and somatic dysfunction of cervical region: Secondary | ICD-10-CM | POA: Diagnosis not present

## 2017-12-18 DIAGNOSIS — M9903 Segmental and somatic dysfunction of lumbar region: Secondary | ICD-10-CM | POA: Diagnosis not present

## 2017-12-18 DIAGNOSIS — Q72891 Other reduction defects of right lower limb: Secondary | ICD-10-CM | POA: Diagnosis not present

## 2017-12-18 DIAGNOSIS — M9902 Segmental and somatic dysfunction of thoracic region: Secondary | ICD-10-CM | POA: Diagnosis not present

## 2017-12-18 DIAGNOSIS — M9905 Segmental and somatic dysfunction of pelvic region: Secondary | ICD-10-CM | POA: Diagnosis not present

## 2017-12-18 NOTE — Progress Notes (Signed)
  Subjective:  Patient ID: SIMBA ARTINO, male    DOB: 1950-05-25,  MRN: [id number]  Chief Complaint  Patient presents with  . Routine Post [employer] 01.02.2019 Repair Post Tibial Tendon/Secondary Rt; Kidner Advanced Tendon Rt Pt. stated," my foot is improving just a little numbess in my great toe." Tx: PT and cam boot (helps)    DOS: 04/10/2017 Procedure: Right foot kidner procedure  67 y.o. male returns for post-op check. Denies N/V/F/Ch.  Reports the foot is improving.  Doing PT and using the cam boot.  Objective:   General AA&O x3. Normal mood and affect.  Vascular Foot warm and well perfused.  Neurologic Gross sensation intact.  Dermatologic Skin well-healed.  No erythema.  No signs of infection  Orthopedic:  No tenderness to palpation noted about the surgical site.    Assessment & Plan:  Patient was evaluated and treated and all questions answered.  S/p right foot Kidner procedure -Progressing as expected post-operatively. -Continue PT and advance weightbearing.  Return in about 4 weeks (around 07/04/2017) for Post-op.

## 2017-12-19 DIAGNOSIS — M50322 Other cervical disc degeneration at C5-C6 level: Secondary | ICD-10-CM | POA: Diagnosis not present

## 2017-12-19 DIAGNOSIS — M531 Cervicobrachial syndrome: Secondary | ICD-10-CM | POA: Diagnosis not present

## 2017-12-19 DIAGNOSIS — M5417 Radiculopathy, lumbosacral region: Secondary | ICD-10-CM | POA: Diagnosis not present

## 2017-12-19 DIAGNOSIS — M9901 Segmental and somatic dysfunction of cervical region: Secondary | ICD-10-CM | POA: Diagnosis not present

## 2017-12-19 DIAGNOSIS — M9903 Segmental and somatic dysfunction of lumbar region: Secondary | ICD-10-CM | POA: Diagnosis not present

## 2017-12-19 DIAGNOSIS — R51 Headache: Secondary | ICD-10-CM | POA: Diagnosis not present

## 2017-12-19 DIAGNOSIS — M9902 Segmental and somatic dysfunction of thoracic region: Secondary | ICD-10-CM | POA: Diagnosis not present

## 2017-12-19 DIAGNOSIS — M50321 Other cervical disc degeneration at C4-C5 level: Secondary | ICD-10-CM | POA: Diagnosis not present

## 2017-12-19 DIAGNOSIS — M9905 Segmental and somatic dysfunction of pelvic region: Secondary | ICD-10-CM | POA: Diagnosis not present

## 2017-12-19 DIAGNOSIS — M5137 Other intervertebral disc degeneration, lumbosacral region: Secondary | ICD-10-CM | POA: Diagnosis not present

## 2017-12-19 DIAGNOSIS — M9904 Segmental and somatic dysfunction of sacral region: Secondary | ICD-10-CM | POA: Diagnosis not present

## 2017-12-19 DIAGNOSIS — Q72891 Other reduction defects of right lower limb: Secondary | ICD-10-CM | POA: Diagnosis not present

## 2017-12-23 DIAGNOSIS — M9903 Segmental and somatic dysfunction of lumbar region: Secondary | ICD-10-CM | POA: Diagnosis not present

## 2017-12-23 DIAGNOSIS — M50321 Other cervical disc degeneration at C4-C5 level: Secondary | ICD-10-CM | POA: Diagnosis not present

## 2017-12-23 DIAGNOSIS — M9905 Segmental and somatic dysfunction of pelvic region: Secondary | ICD-10-CM | POA: Diagnosis not present

## 2017-12-23 DIAGNOSIS — R51 Headache: Secondary | ICD-10-CM | POA: Diagnosis not present

## 2017-12-23 DIAGNOSIS — M531 Cervicobrachial syndrome: Secondary | ICD-10-CM | POA: Diagnosis not present

## 2017-12-23 DIAGNOSIS — M5137 Other intervertebral disc degeneration, lumbosacral region: Secondary | ICD-10-CM | POA: Diagnosis not present

## 2017-12-23 DIAGNOSIS — M9901 Segmental and somatic dysfunction of cervical region: Secondary | ICD-10-CM | POA: Diagnosis not present

## 2017-12-23 DIAGNOSIS — M50322 Other cervical disc degeneration at C5-C6 level: Secondary | ICD-10-CM | POA: Diagnosis not present

## 2017-12-23 DIAGNOSIS — M9902 Segmental and somatic dysfunction of thoracic region: Secondary | ICD-10-CM | POA: Diagnosis not present

## 2017-12-23 DIAGNOSIS — M5417 Radiculopathy, lumbosacral region: Secondary | ICD-10-CM | POA: Diagnosis not present

## 2017-12-23 DIAGNOSIS — M9904 Segmental and somatic dysfunction of sacral region: Secondary | ICD-10-CM | POA: Diagnosis not present

## 2017-12-23 DIAGNOSIS — Q72891 Other reduction defects of right lower limb: Secondary | ICD-10-CM | POA: Diagnosis not present

## 2017-12-25 DIAGNOSIS — Q72891 Other reduction defects of right lower limb: Secondary | ICD-10-CM | POA: Diagnosis not present

## 2017-12-25 DIAGNOSIS — M50322 Other cervical disc degeneration at C5-C6 level: Secondary | ICD-10-CM | POA: Diagnosis not present

## 2017-12-25 DIAGNOSIS — M5417 Radiculopathy, lumbosacral region: Secondary | ICD-10-CM | POA: Diagnosis not present

## 2017-12-25 DIAGNOSIS — M9902 Segmental and somatic dysfunction of thoracic region: Secondary | ICD-10-CM | POA: Diagnosis not present

## 2017-12-25 DIAGNOSIS — M9903 Segmental and somatic dysfunction of lumbar region: Secondary | ICD-10-CM | POA: Diagnosis not present

## 2017-12-25 DIAGNOSIS — M9904 Segmental and somatic dysfunction of sacral region: Secondary | ICD-10-CM | POA: Diagnosis not present

## 2017-12-25 DIAGNOSIS — M50321 Other cervical disc degeneration at C4-C5 level: Secondary | ICD-10-CM | POA: Diagnosis not present

## 2017-12-25 DIAGNOSIS — M9905 Segmental and somatic dysfunction of pelvic region: Secondary | ICD-10-CM | POA: Diagnosis not present

## 2017-12-25 DIAGNOSIS — M5137 Other intervertebral disc degeneration, lumbosacral region: Secondary | ICD-10-CM | POA: Diagnosis not present

## 2017-12-25 DIAGNOSIS — R51 Headache: Secondary | ICD-10-CM | POA: Diagnosis not present

## 2017-12-25 DIAGNOSIS — M531 Cervicobrachial syndrome: Secondary | ICD-10-CM | POA: Diagnosis not present

## 2017-12-25 DIAGNOSIS — M9901 Segmental and somatic dysfunction of cervical region: Secondary | ICD-10-CM | POA: Diagnosis not present

## 2017-12-26 DIAGNOSIS — M531 Cervicobrachial syndrome: Secondary | ICD-10-CM | POA: Diagnosis not present

## 2017-12-26 DIAGNOSIS — Q72891 Other reduction defects of right lower limb: Secondary | ICD-10-CM | POA: Diagnosis not present

## 2017-12-26 DIAGNOSIS — M9905 Segmental and somatic dysfunction of pelvic region: Secondary | ICD-10-CM | POA: Diagnosis not present

## 2017-12-26 DIAGNOSIS — R51 Headache: Secondary | ICD-10-CM | POA: Diagnosis not present

## 2017-12-26 DIAGNOSIS — M5137 Other intervertebral disc degeneration, lumbosacral region: Secondary | ICD-10-CM | POA: Diagnosis not present

## 2017-12-26 DIAGNOSIS — M9902 Segmental and somatic dysfunction of thoracic region: Secondary | ICD-10-CM | POA: Diagnosis not present

## 2017-12-26 DIAGNOSIS — M50322 Other cervical disc degeneration at C5-C6 level: Secondary | ICD-10-CM | POA: Diagnosis not present

## 2017-12-26 DIAGNOSIS — M9904 Segmental and somatic dysfunction of sacral region: Secondary | ICD-10-CM | POA: Diagnosis not present

## 2017-12-26 DIAGNOSIS — M9901 Segmental and somatic dysfunction of cervical region: Secondary | ICD-10-CM | POA: Diagnosis not present

## 2017-12-26 DIAGNOSIS — M5417 Radiculopathy, lumbosacral region: Secondary | ICD-10-CM | POA: Diagnosis not present

## 2017-12-26 DIAGNOSIS — M50321 Other cervical disc degeneration at C4-C5 level: Secondary | ICD-10-CM | POA: Diagnosis not present

## 2017-12-26 DIAGNOSIS — M9903 Segmental and somatic dysfunction of lumbar region: Secondary | ICD-10-CM | POA: Diagnosis not present

## 2017-12-30 ENCOUNTER — Other Ambulatory Visit: Payer: Self-pay | Admitting: Internal Medicine

## 2017-12-30 DIAGNOSIS — M5417 Radiculopathy, lumbosacral region: Secondary | ICD-10-CM | POA: Diagnosis not present

## 2017-12-30 DIAGNOSIS — R51 Headache: Secondary | ICD-10-CM | POA: Diagnosis not present

## 2017-12-30 DIAGNOSIS — Q72891 Other reduction defects of right lower limb: Secondary | ICD-10-CM | POA: Diagnosis not present

## 2017-12-30 DIAGNOSIS — M531 Cervicobrachial syndrome: Secondary | ICD-10-CM | POA: Diagnosis not present

## 2017-12-30 DIAGNOSIS — M5137 Other intervertebral disc degeneration, lumbosacral region: Secondary | ICD-10-CM | POA: Diagnosis not present

## 2017-12-30 DIAGNOSIS — M9903 Segmental and somatic dysfunction of lumbar region: Secondary | ICD-10-CM | POA: Diagnosis not present

## 2017-12-30 DIAGNOSIS — M9905 Segmental and somatic dysfunction of pelvic region: Secondary | ICD-10-CM | POA: Diagnosis not present

## 2017-12-30 DIAGNOSIS — M50321 Other cervical disc degeneration at C4-C5 level: Secondary | ICD-10-CM | POA: Diagnosis not present

## 2017-12-30 DIAGNOSIS — M9904 Segmental and somatic dysfunction of sacral region: Secondary | ICD-10-CM | POA: Diagnosis not present

## 2017-12-30 DIAGNOSIS — M9901 Segmental and somatic dysfunction of cervical region: Secondary | ICD-10-CM | POA: Diagnosis not present

## 2017-12-30 DIAGNOSIS — M50322 Other cervical disc degeneration at C5-C6 level: Secondary | ICD-10-CM | POA: Diagnosis not present

## 2017-12-30 DIAGNOSIS — M9902 Segmental and somatic dysfunction of thoracic region: Secondary | ICD-10-CM | POA: Diagnosis not present

## 2017-12-31 ENCOUNTER — Encounter: Payer: Self-pay | Admitting: Adult Health

## 2017-12-31 ENCOUNTER — Ambulatory Visit (INDEPENDENT_AMBULATORY_CARE_PROVIDER_SITE_OTHER): Payer: PPO | Admitting: Adult Health

## 2017-12-31 VITALS — BP 120/80 | HR 65 | Temp 98.0°F | Wt 270.2 lb

## 2017-12-31 DIAGNOSIS — I2510 Atherosclerotic heart disease of native coronary artery without angina pectoris: Secondary | ICD-10-CM

## 2017-12-31 DIAGNOSIS — Z7689 Persons encountering health services in other specified circumstances: Secondary | ICD-10-CM | POA: Diagnosis not present

## 2017-12-31 DIAGNOSIS — G4730 Sleep apnea, unspecified: Secondary | ICD-10-CM | POA: Diagnosis not present

## 2017-12-31 DIAGNOSIS — I10 Essential (primary) hypertension: Secondary | ICD-10-CM | POA: Diagnosis not present

## 2017-12-31 MED ORDER — LOSARTAN POTASSIUM 100 MG PO TABS: 100.0000 mg | ORAL_TABLET | Freq: Every day | ORAL | 2 refills | Status: DC

## 2017-12-31 MED ORDER — MECLIZINE HCL 25 MG PO TABS: 25.0000 mg | ORAL_TABLET | Freq: Three times a day (TID) | ORAL | 1 refills | Status: DC | PRN

## 2017-12-31 NOTE — Progress Notes (Signed)
Patient presents to clinic today to establish care. He is a pleasant 67 year old male who  has a past medical history of BACK PAIN (03/10/2007), CAD (coronary artery disease), ED (erectile dysfunction), GERD (gastroesophageal reflux disease), History of mumps orchitis, HYPERTENSION (11/15/2006), INGUINAL HERNIA, RIGHT (12/05/2009), LEG CRAMPS (12/03/2008), SLEEP APNEA (11/16/2008), Unstable angina (HCC), and VERTIGO, POSITIONAL (08/08/2009).   He is a former patient of Dr. Kirtland BouchardK, who is transferring care   His last CPE was in June 2019   Acute Concerns: Establish Care  Chronic Issues: Essential Hypertension - Takes Cozaar 100 mg. Denies any chest pain, SOB, headaches, dizziness, or syncopal episodes BP Readings from Last 3 Encounters:  12/31/17 120/80  11/15/17 120/70  11/05/17 128/70   Hyperlipidemia/CAD - Takes statin  Lab Results  Component Value Date   CHOL 112 09/30/2017   HDL 37.60 (L) 09/30/2017   LDLCALC 43 09/30/2017   TRIG 158.0 (H) 09/30/2017   CHOLHDL 3 09/30/2017   GERD - Controlled with Prilosec   Vertigo - takes Meclizine as needed.   Sleep Apnea - refuses to go on CPAP  Health Maintenance: Dental -- Does not do routine care [facility] -- Routine Care - Dr. Sherryle Lisolan  Immunizations -- Vaccinations UTD Colonoscopy -- UTD  Diet: Does not follow a specific diet  Exercise: Does not exercise   Past Medical History:  Diagnosis Date  . BACK PAIN 03/10/2007  . CAD (coronary artery disease)    A.  01/05/2002 Cath - nonobs dzs (LAD 20prox, RI 30-40 prox)  . ED (erectile dysfunction)   . GERD (gastroesophageal reflux disease)   . History of mumps orchitis   . HYPERTENSION 11/15/2006  . INGUINAL HERNIA, RIGHT 12/05/2009  . LEG CRAMPS 12/03/2008  . SLEEP APNEA 11/16/2008  . Unstable angina (HCC)   . VERTIGO, POSITIONAL 08/08/2009    Past Surgical History:  Procedure Laterality Date  . CARDIAC CATHETERIZATION    . KNEE ARTHROSCOPY     Right    Current Outpatient Medications on  File Prior to Visit  Medication Sig Dispense Refill  . atorvastatin (LIPITOR) 20 MG tablet TAKE 1 TABLET BY MOUTH ONCE DAILY 90 tablet 2  . EPINEPHrine 0.3 mg/0.3 mL IJ SOAJ injection     . losartan (COZAAR) 100 MG tablet TAKE 1 TABLET BY MOUTH ONCE DAILY 90 tablet 2  . meclizine (ANTIVERT) 25 MG tablet Take 25 mg by mouth 3 (three) times daily as needed for dizziness.    Marland Kitchen. omeprazole (PRILOSEC) 20 MG capsule Take 20 mg by mouth every morning.     No current facility-administered medications on file prior to visit.     Allergies  Allergen Reactions  . Bee Venom Anaphylaxis  . Codeine Phosphate Itching  . Penicillins Other (See Comments)    Has patient had a PCN reaction causing immediate rash, facial/tongue/throat swelling, SOB or lightheadedness with hypotension: Unknown, childhood reaction Has patient had a PCN reaction causing severe rash involving mucus membranes or skin necrosis: No Has patient had a PCN reaction that required hospitalization No Has patient had a PCN reaction occurring within the last 10 years: No If all of the above answers are "NO", then may proceed with Cephalosporin use.   . Prednisone Itching    Family History  Problem Relation Age of Onset  . Pneumonia Mother        died 3788 - hip fx/pna  . Heart attack Father        died 4790  . Hypertension  Sister        alive  . Cancer Brother        died - agent orange exposure  . Hypertension Brother        alive    Social History   Socioeconomic History  . Marital status: Married    Spouse name: Not on file  . Number of children: Not on file  . Years of education: Not on file  . Highest education level: Not on file  Occupational History  . Not on file  Social Needs  . Financial resource strain: Not on file  . Food insecurity:    Worry: Not on file    Inability: Not on file  . Transportation needs:    Medical: Not on file    Non-medical: Not on file  Tobacco Use  . Smoking status: Never Smoker    . Smokeless tobacco: Never Used  Substance and Sexual Activity  . Alcohol use: No  . Drug use: No  . Sexual activity: Not on file  Lifestyle  . Physical activity:    Days per week: Not on file    Minutes per session: Not on file  . Stress: Not on file  Relationships  . Social connections:    Talks on phone: Not on file    Gets together: Not on file    Attends religious service: Not on file    Active member of club or organization: Not on file    Attends meetings of clubs or organizations: Not on file    Relationship status: Not on file  . Intimate partner violence:    Fear of current or ex partner: Not on file    Emotionally abused: Not on file    Physically abused: Not on file    Forced sexual activity: Not on file  Other Topics Concern  . Not on file  Social History Narrative   05/02/2011:  Currently unemployed.  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  Does not exercise or adhere to any specific diet.    Review of Systems  Constitutional: Negative.   HENT: Negative.   Eyes: Negative.   Respiratory: Negative.   Cardiovascular: Negative.   Gastrointestinal: Negative.   Genitourinary: Negative.   Musculoskeletal: Negative.   Skin: Negative.   Neurological: Negative.   Psychiatric/Behavioral: Negative.   All other systems reviewed and are negative.     BP 120/80 (BP Location: Left Arm, Patient Position: Sitting, Cuff Size: Large)   Pulse 65   Temp 98 F (36.7 C) (Oral)   Wt 270 lb 3.2 oz (122.6 kg)   SpO2 95%   BMI 38.77 kg/m   Physical Exam  Constitutional: He is oriented to person, place, and time. He appears well-developed and well-nourished. No distress.  Obese    Cardiovascular: Normal rate, regular rhythm, normal heart sounds and intact distal pulses.  Pulmonary/Chest: Effort normal and breath sounds normal.  Neurological: He is alert and oriented to person, place, and time.  Skin: Skin is warm and dry. He is not diaphoretic.   Psychiatric: He has a normal mood and affect. His behavior is normal. Judgment and thought content normal.  Nursing note and vitals reviewed.    Assessment/Plan: 1. Encounter to establish care - Follow up in June for CPE  - Encouraged lifestyle modifications   2. Essential hypertension - Well controlled.  - losartan (COZAAR) 100 MG tablet; Take 1 tablet (100 mg total) by mouth daily.  Dispense: 90 tablet; Refill: 2  3. Coronary artery disease involving native coronary artery of native heart without angina pectoris - Continue with lipitor   4. Sleep apnea, unspecified type - Encouraged pulmonary consult    [name], NP

## 2017-12-31 NOTE — Telephone Encounter (Signed)
[facility]ory[facility] Pt 

## 2018-01-01 DIAGNOSIS — M5417 Radiculopathy, lumbosacral region: Secondary | ICD-10-CM | POA: Diagnosis not present

## 2018-01-01 DIAGNOSIS — M50321 Other cervical disc degeneration at C4-C5 level: Secondary | ICD-10-CM | POA: Diagnosis not present

## 2018-01-01 DIAGNOSIS — M9901 Segmental and somatic dysfunction of cervical region: Secondary | ICD-10-CM | POA: Diagnosis not present

## 2018-01-01 DIAGNOSIS — Q72891 Other reduction defects of right lower limb: Secondary | ICD-10-CM | POA: Diagnosis not present

## 2018-01-01 DIAGNOSIS — M531 Cervicobrachial syndrome: Secondary | ICD-10-CM | POA: Diagnosis not present

## 2018-01-01 DIAGNOSIS — M50322 Other cervical disc degeneration at C5-C6 level: Secondary | ICD-10-CM | POA: Diagnosis not present

## 2018-01-01 DIAGNOSIS — M9905 Segmental and somatic dysfunction of pelvic region: Secondary | ICD-10-CM | POA: Diagnosis not present

## 2018-01-01 DIAGNOSIS — M9904 Segmental and somatic dysfunction of sacral region: Secondary | ICD-10-CM | POA: Diagnosis not present

## 2018-01-01 DIAGNOSIS — M5137 Other intervertebral disc degeneration, lumbosacral region: Secondary | ICD-10-CM | POA: Diagnosis not present

## 2018-01-01 DIAGNOSIS — M9902 Segmental and somatic dysfunction of thoracic region: Secondary | ICD-10-CM | POA: Diagnosis not present

## 2018-01-01 DIAGNOSIS — M9903 Segmental and somatic dysfunction of lumbar region: Secondary | ICD-10-CM | POA: Diagnosis not present

## 2018-01-01 DIAGNOSIS — R51 Headache: Secondary | ICD-10-CM | POA: Diagnosis not present

## 2018-01-02 DIAGNOSIS — Q72891 Other reduction defects of right lower limb: Secondary | ICD-10-CM | POA: Diagnosis not present

## 2018-01-02 DIAGNOSIS — M9904 Segmental and somatic dysfunction of sacral region: Secondary | ICD-10-CM | POA: Diagnosis not present

## 2018-01-02 DIAGNOSIS — M9902 Segmental and somatic dysfunction of thoracic region: Secondary | ICD-10-CM | POA: Diagnosis not present

## 2018-01-02 DIAGNOSIS — M531 Cervicobrachial syndrome: Secondary | ICD-10-CM | POA: Diagnosis not present

## 2018-01-02 DIAGNOSIS — M9905 Segmental and somatic dysfunction of pelvic region: Secondary | ICD-10-CM | POA: Diagnosis not present

## 2018-01-02 DIAGNOSIS — M50321 Other cervical disc degeneration at C4-C5 level: Secondary | ICD-10-CM | POA: Diagnosis not present

## 2018-01-02 DIAGNOSIS — M5417 Radiculopathy, lumbosacral region: Secondary | ICD-10-CM | POA: Diagnosis not present

## 2018-01-02 DIAGNOSIS — M5137 Other intervertebral disc degeneration, lumbosacral region: Secondary | ICD-10-CM | POA: Diagnosis not present

## 2018-01-02 DIAGNOSIS — M50322 Other cervical disc degeneration at C5-C6 level: Secondary | ICD-10-CM | POA: Diagnosis not present

## 2018-01-02 DIAGNOSIS — R51 Headache: Secondary | ICD-10-CM | POA: Diagnosis not present

## 2018-01-02 DIAGNOSIS — M9901 Segmental and somatic dysfunction of cervical region: Secondary | ICD-10-CM | POA: Diagnosis not present

## 2018-01-02 DIAGNOSIS — M9903 Segmental and somatic dysfunction of lumbar region: Secondary | ICD-10-CM | POA: Diagnosis not present

## 2018-01-06 DIAGNOSIS — M9903 Segmental and somatic dysfunction of lumbar region: Secondary | ICD-10-CM | POA: Diagnosis not present

## 2018-01-06 DIAGNOSIS — M5417 Radiculopathy, lumbosacral region: Secondary | ICD-10-CM | POA: Diagnosis not present

## 2018-01-06 DIAGNOSIS — M9904 Segmental and somatic dysfunction of sacral region: Secondary | ICD-10-CM | POA: Diagnosis not present

## 2018-01-06 DIAGNOSIS — M50322 Other cervical disc degeneration at C5-C6 level: Secondary | ICD-10-CM | POA: Diagnosis not present

## 2018-01-06 DIAGNOSIS — M50321 Other cervical disc degeneration at C4-C5 level: Secondary | ICD-10-CM | POA: Diagnosis not present

## 2018-01-06 DIAGNOSIS — M9905 Segmental and somatic dysfunction of pelvic region: Secondary | ICD-10-CM | POA: Diagnosis not present

## 2018-01-06 DIAGNOSIS — M9901 Segmental and somatic dysfunction of cervical region: Secondary | ICD-10-CM | POA: Diagnosis not present

## 2018-01-06 DIAGNOSIS — R51 Headache: Secondary | ICD-10-CM | POA: Diagnosis not present

## 2018-01-06 DIAGNOSIS — Q72891 Other reduction defects of right lower limb: Secondary | ICD-10-CM | POA: Diagnosis not present

## 2018-01-06 DIAGNOSIS — M9902 Segmental and somatic dysfunction of thoracic region: Secondary | ICD-10-CM | POA: Diagnosis not present

## 2018-01-06 DIAGNOSIS — M531 Cervicobrachial syndrome: Secondary | ICD-10-CM | POA: Diagnosis not present

## 2018-01-06 DIAGNOSIS — M5137 Other intervertebral disc degeneration, lumbosacral region: Secondary | ICD-10-CM | POA: Diagnosis not present

## 2018-01-07 ENCOUNTER — Encounter: Payer: Self-pay | Admitting: Adult Health

## 2018-01-08 DIAGNOSIS — R51 Headache: Secondary | ICD-10-CM | POA: Diagnosis not present

## 2018-01-08 DIAGNOSIS — M9905 Segmental and somatic dysfunction of pelvic region: Secondary | ICD-10-CM | POA: Diagnosis not present

## 2018-01-08 DIAGNOSIS — M50321 Other cervical disc degeneration at C4-C5 level: Secondary | ICD-10-CM | POA: Diagnosis not present

## 2018-01-08 DIAGNOSIS — M531 Cervicobrachial syndrome: Secondary | ICD-10-CM | POA: Diagnosis not present

## 2018-01-08 DIAGNOSIS — M9902 Segmental and somatic dysfunction of thoracic region: Secondary | ICD-10-CM | POA: Diagnosis not present

## 2018-01-08 DIAGNOSIS — M9901 Segmental and somatic dysfunction of cervical region: Secondary | ICD-10-CM | POA: Diagnosis not present

## 2018-01-08 DIAGNOSIS — M5137 Other intervertebral disc degeneration, lumbosacral region: Secondary | ICD-10-CM | POA: Diagnosis not present

## 2018-01-08 DIAGNOSIS — M9903 Segmental and somatic dysfunction of lumbar region: Secondary | ICD-10-CM | POA: Diagnosis not present

## 2018-01-08 DIAGNOSIS — M9904 Segmental and somatic dysfunction of sacral region: Secondary | ICD-10-CM | POA: Diagnosis not present

## 2018-01-08 DIAGNOSIS — Q72891 Other reduction defects of right lower limb: Secondary | ICD-10-CM | POA: Diagnosis not present

## 2018-01-08 DIAGNOSIS — M5417 Radiculopathy, lumbosacral region: Secondary | ICD-10-CM | POA: Diagnosis not present

## 2018-01-08 DIAGNOSIS — M50322 Other cervical disc degeneration at C5-C6 level: Secondary | ICD-10-CM | POA: Diagnosis not present

## 2018-01-13 DIAGNOSIS — M5417 Radiculopathy, lumbosacral region: Secondary | ICD-10-CM | POA: Diagnosis not present

## 2018-01-13 DIAGNOSIS — M50322 Other cervical disc degeneration at C5-C6 level: Secondary | ICD-10-CM | POA: Diagnosis not present

## 2018-01-13 DIAGNOSIS — M9901 Segmental and somatic dysfunction of cervical region: Secondary | ICD-10-CM | POA: Diagnosis not present

## 2018-01-13 DIAGNOSIS — R51 Headache: Secondary | ICD-10-CM | POA: Diagnosis not present

## 2018-01-13 DIAGNOSIS — M9905 Segmental and somatic dysfunction of pelvic region: Secondary | ICD-10-CM | POA: Diagnosis not present

## 2018-01-13 DIAGNOSIS — M9904 Segmental and somatic dysfunction of sacral region: Secondary | ICD-10-CM | POA: Diagnosis not present

## 2018-01-13 DIAGNOSIS — M531 Cervicobrachial syndrome: Secondary | ICD-10-CM | POA: Diagnosis not present

## 2018-01-13 DIAGNOSIS — Q72891 Other reduction defects of right lower limb: Secondary | ICD-10-CM | POA: Diagnosis not present

## 2018-01-13 DIAGNOSIS — M5137 Other intervertebral disc degeneration, lumbosacral region: Secondary | ICD-10-CM | POA: Diagnosis not present

## 2018-01-13 DIAGNOSIS — M50321 Other cervical disc degeneration at C4-C5 level: Secondary | ICD-10-CM | POA: Diagnosis not present

## 2018-01-13 DIAGNOSIS — M9902 Segmental and somatic dysfunction of thoracic region: Secondary | ICD-10-CM | POA: Diagnosis not present

## 2018-01-13 DIAGNOSIS — M9903 Segmental and somatic dysfunction of lumbar region: Secondary | ICD-10-CM | POA: Diagnosis not present

## 2018-01-23 DIAGNOSIS — M50322 Other cervical disc degeneration at C5-C6 level: Secondary | ICD-10-CM | POA: Diagnosis not present

## 2018-01-23 DIAGNOSIS — M9905 Segmental and somatic dysfunction of pelvic region: Secondary | ICD-10-CM | POA: Diagnosis not present

## 2018-01-23 DIAGNOSIS — M5417 Radiculopathy, lumbosacral region: Secondary | ICD-10-CM | POA: Diagnosis not present

## 2018-01-23 DIAGNOSIS — M9902 Segmental and somatic dysfunction of thoracic region: Secondary | ICD-10-CM | POA: Diagnosis not present

## 2018-01-23 DIAGNOSIS — M5137 Other intervertebral disc degeneration, lumbosacral region: Secondary | ICD-10-CM | POA: Diagnosis not present

## 2018-01-23 DIAGNOSIS — M9901 Segmental and somatic dysfunction of cervical region: Secondary | ICD-10-CM | POA: Diagnosis not present

## 2018-01-23 DIAGNOSIS — M531 Cervicobrachial syndrome: Secondary | ICD-10-CM | POA: Diagnosis not present

## 2018-01-23 DIAGNOSIS — M50321 Other cervical disc degeneration at C4-C5 level: Secondary | ICD-10-CM | POA: Diagnosis not present

## 2018-01-23 DIAGNOSIS — M9903 Segmental and somatic dysfunction of lumbar region: Secondary | ICD-10-CM | POA: Diagnosis not present

## 2018-01-23 DIAGNOSIS — Q72891 Other reduction defects of right lower limb: Secondary | ICD-10-CM | POA: Diagnosis not present

## 2018-01-23 DIAGNOSIS — R51 Headache: Secondary | ICD-10-CM | POA: Diagnosis not present

## 2018-01-23 DIAGNOSIS — M9904 Segmental and somatic dysfunction of sacral region: Secondary | ICD-10-CM | POA: Diagnosis not present

## 2018-02-06 DIAGNOSIS — Q72891 Other reduction defects of right lower limb: Secondary | ICD-10-CM | POA: Diagnosis not present

## 2018-02-06 DIAGNOSIS — M531 Cervicobrachial syndrome: Secondary | ICD-10-CM | POA: Diagnosis not present

## 2018-02-06 DIAGNOSIS — M9904 Segmental and somatic dysfunction of sacral region: Secondary | ICD-10-CM | POA: Diagnosis not present

## 2018-02-06 DIAGNOSIS — M9902 Segmental and somatic dysfunction of thoracic region: Secondary | ICD-10-CM | POA: Diagnosis not present

## 2018-02-06 DIAGNOSIS — M9901 Segmental and somatic dysfunction of cervical region: Secondary | ICD-10-CM | POA: Diagnosis not present

## 2018-02-06 DIAGNOSIS — M50321 Other cervical disc degeneration at C4-C5 level: Secondary | ICD-10-CM | POA: Diagnosis not present

## 2018-02-06 DIAGNOSIS — R51 Headache: Secondary | ICD-10-CM | POA: Diagnosis not present

## 2018-02-06 DIAGNOSIS — M5137 Other intervertebral disc degeneration, lumbosacral region: Secondary | ICD-10-CM | POA: Diagnosis not present

## 2018-02-06 DIAGNOSIS — M9905 Segmental and somatic dysfunction of pelvic region: Secondary | ICD-10-CM | POA: Diagnosis not present

## 2018-02-06 DIAGNOSIS — M5417 Radiculopathy, lumbosacral region: Secondary | ICD-10-CM | POA: Diagnosis not present

## 2018-02-06 DIAGNOSIS — M9903 Segmental and somatic dysfunction of lumbar region: Secondary | ICD-10-CM | POA: Diagnosis not present

## 2018-02-06 DIAGNOSIS — M50322 Other cervical disc degeneration at C5-C6 level: Secondary | ICD-10-CM | POA: Diagnosis not present

## 2018-02-26 DIAGNOSIS — M9901 Segmental and somatic dysfunction of cervical region: Secondary | ICD-10-CM | POA: Diagnosis not present

## 2018-02-26 DIAGNOSIS — Q72891 Other reduction defects of right lower limb: Secondary | ICD-10-CM | POA: Diagnosis not present

## 2018-02-26 DIAGNOSIS — M5137 Other intervertebral disc degeneration, lumbosacral region: Secondary | ICD-10-CM | POA: Diagnosis not present

## 2018-02-26 DIAGNOSIS — M9903 Segmental and somatic dysfunction of lumbar region: Secondary | ICD-10-CM | POA: Diagnosis not present

## 2018-02-26 DIAGNOSIS — M50322 Other cervical disc degeneration at C5-C6 level: Secondary | ICD-10-CM | POA: Diagnosis not present

## 2018-02-26 DIAGNOSIS — M50321 Other cervical disc degeneration at C4-C5 level: Secondary | ICD-10-CM | POA: Diagnosis not present

## 2018-02-26 DIAGNOSIS — M531 Cervicobrachial syndrome: Secondary | ICD-10-CM | POA: Diagnosis not present

## 2018-02-26 DIAGNOSIS — R51 Headache: Secondary | ICD-10-CM | POA: Diagnosis not present

## 2018-02-26 DIAGNOSIS — M9905 Segmental and somatic dysfunction of pelvic region: Secondary | ICD-10-CM | POA: Diagnosis not present

## 2018-02-26 DIAGNOSIS — M9902 Segmental and somatic dysfunction of thoracic region: Secondary | ICD-10-CM | POA: Diagnosis not present

## 2018-02-26 DIAGNOSIS — M5417 Radiculopathy, lumbosacral region: Secondary | ICD-10-CM | POA: Diagnosis not present

## 2018-02-26 DIAGNOSIS — M9904 Segmental and somatic dysfunction of sacral region: Secondary | ICD-10-CM | POA: Diagnosis not present

## 2018-04-03 ENCOUNTER — Ambulatory Visit (INDEPENDENT_AMBULATORY_CARE_PROVIDER_SITE_OTHER): Payer: PPO | Admitting: Adult Health

## 2018-04-03 ENCOUNTER — Ambulatory Visit: Payer: Self-pay | Admitting: *Deleted

## 2018-04-03 ENCOUNTER — Ambulatory Visit (INDEPENDENT_AMBULATORY_CARE_PROVIDER_SITE_OTHER): Payer: PPO

## 2018-04-03 VITALS — BP 150/78 | HR 66 | Temp 98.6°F | Ht 69.75 in | Wt 262.8 lb

## 2018-04-03 DIAGNOSIS — J069 Acute upper respiratory infection, unspecified: Secondary | ICD-10-CM

## 2018-04-03 DIAGNOSIS — R079 Chest pain, unspecified: Secondary | ICD-10-CM | POA: Diagnosis not present

## 2018-04-03 MED ORDER — HYDROCODONE-HOMATROPINE 5-1.5 MG/5ML PO SYRP: 5.0000 mL | ORAL_SOLUTION | Freq: Three times a day (TID) | ORAL | 0 refills | Status: DC | PRN

## 2018-04-03 MED ORDER — METHYLPREDNISOLONE 4 MG PO TBPK: ORAL_TABLET | ORAL | 0 refills | Status: DC

## 2018-04-03 NOTE — Telephone Encounter (Signed)
Pt called with complaints of "chest hurting from chest cold"; he says that his ribs hurt; the pt says that he has also been sneezing, coughing, and has yellow nasal secretions; it hurts to take a deep breath, and is painful to lay on his left side and back; the pt says that he has been taking mucinex and robitussin with no relief; the pt also reports that his son is sick with the same symptoms; recommendations made per nurse triage protocol; pt offered and accepted appointment with Shirline Freesory Nafziger, [facility], 04/03/18 at 1600; he verbalized understanding; will route to office for notification of this upcoming appointment.      Reason for Disposition . [1] MILD difficulty breathing (e.g., minimal/no SOB at rest, SOB with walking, pulse <100) AND [2] NEW-onset or WORSE than normal  Answer Assessment - Initial Assessment Questions 1. RESPIRATORY STATUS: "Describe your breathing?" (e.g., wheezing, shortness of breath, unable to speak, severe coughing)      Occasional shortness of breath; hard to take a deep breath 2. ONSET: "When did this breathing problem begin?"      04/01/18 3. PATTERN "Does the difficult breathing come and go, or has it been constant since it started?"      constant 4. SEVERITY: "How bad is your breathing?" (e.g., mild, moderate, severe)    - MILD: No SOB at rest, mild SOB with walking, speaks normally in sentences, can lay down, no retractions, pulse < 100.    - MODERATE: SOB at rest, SOB with minimal exertion and prefers to sit, cannot lie down flat, speaks in phrases, mild retractions, audible wheezing, pulse 100-120.    - SEVERE: Very SOB at rest, speaks in single words, struggling to breathe, sitting hunched forward, retractions, pulse > 120      mild 5. RECURRENT SYMPTOM: "Have you had difficulty breathing before?" If so, ask: "When was the last time?" and "What happened that time?"      no 6. CARDIAC HISTORY: "Do you have any history of heart disease?" (e.g., heart  attack, angina, bypass surgery, angioplasty)      Hypertension 7. LUNG HISTORY: "Do you have any history of lung disease?"  (e.g., pulmonary embolus, asthma, emphysema)     no 8. CAUSE: "What do you think is causing the breathing problem?"      unknown 9. OTHER SYMPTOMS: "Do you have any other symptoms? (e.g., dizziness, runny nose, cough, chest pain, fever)     Back pain, sneezing, coughing, hurts to take a deep breath 10. PREGNANCY: "Is there any chance you are pregnant?" "When was your last menstrual period?"       n/a 11. TRAVEL: "Have you traveled out of the country in the last month?" (e.g., travel history, exposures)       no  Protocols used: BREATHING DIFFICULTY-A-AH

## 2018-04-03 NOTE — Progress Notes (Signed)
Subjective:    Patient ID: David Vincent, male    DOB: 10/07/1950, 67 y.o.   MRN: [id number]  HPI 67 year old male who  has a past medical history of BACK PAIN (03/10/2007), CAD (coronary artery disease), ED (erectile dysfunction), GERD (gastroesophageal reflux disease), History of mumps orchitis, HYPERTENSION (11/15/2006), INGUINAL HERNIA, RIGHT (12/05/2009), LEG CRAMPS (12/03/2008), SLEEP APNEA (11/16/2008), Unstable angina (HCC), and VERTIGO, POSITIONAL (08/08/2009).  He presents to the office today for concern of non productive cough, chest congestion, and shortness of breath x 2 days.   He denies fevers , nausea, vomiting, diarrhea.    Review of Systems See HPI  Past Medical History:  Diagnosis Date  . BACK PAIN 03/10/2007  . CAD (coronary artery disease)    A.  01/05/2002 Cath - nonobs dzs (LAD 20prox, RI 30-40 prox)  . ED (erectile dysfunction)   . GERD (gastroesophageal reflux disease)   . History of mumps orchitis   . HYPERTENSION 11/15/2006  . INGUINAL HERNIA, RIGHT 12/05/2009  . LEG CRAMPS 12/03/2008  . SLEEP APNEA 11/16/2008  . Unstable angina (HCC)   . VERTIGO, POSITIONAL 08/08/2009    Social History   Socioeconomic History  . Marital status: Married    Spouse name: Not on file  . Number of children: Not on file  . Years of education: Not on file  . Highest education level: Not on file  Occupational History  . Not on file  Social Needs  . Financial resource strain: Not on file  . Food insecurity:    Worry: Not on file    Inability: Not on file  . Transportation needs:    Medical: Not on file    Non-medical: Not on file  Tobacco Use  . Smoking status: Never Smoker  . Smokeless tobacco: Never Used  Substance and Sexual Activity  . Alcohol use: No  . Drug use: No  . Sexual activity: Not on file  Lifestyle  . Physical activity:    Days per week: Not on file    Minutes per session: Not on file  . Stress: Not on file  Relationships  . Social connections:   Talks on phone: Not on file    Gets together: Not on file    Attends religious service: Not on file    Active member of club or organization: Not on file    Attends meetings of clubs or organizations: Not on file    Relationship status: Not on file  . Intimate partner violence:    Fear of current or ex partner: Not on file    Emotionally abused: Not on file    Physically abused: Not on file    Forced sexual activity: Not on file  Other Topics Concern  . Not on file  Social History Narrative   05/02/2011:  Currently unemployed.  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  Does not exercise or adhere to any specific diet.    Past Surgical History:  Procedure Laterality Date  . CARDIAC CATHETERIZATION    . FOOT SURGERY Right 2019  . KNEE ARTHROSCOPY     Right    Family History  Problem Relation Age of Onset  . Pneumonia Mother        died 7288 - hip fx/pna  . Heart attack Father        died 9890  . Hypertension Sister        alive  . Cancer Brother  died - agent orange exposure  . Hypertension Brother        alive    Allergies  Allergen Reactions  . Bee Venom Anaphylaxis  . Codeine Phosphate Itching  . Penicillins Other (See Comments)    Has patient had a PCN reaction causing immediate rash, facial/tongue/throat swelling, SOB or lightheadedness with hypotension: Unknown, childhood reaction Has patient had a PCN reaction causing severe rash involving mucus membranes or skin necrosis: No Has patient had a PCN reaction that required hospitalization No Has patient had a PCN reaction occurring within the last 10 years: No If all of the above answers are "NO", then may proceed with Cephalosporin use.   . Prednisone Itching    Current Outpatient Medications on File Prior to Visit  Medication Sig Dispense Refill  . atorvastatin (LIPITOR) 20 MG tablet TAKE 1 TABLET BY MOUTH ONCE DAILY 90 tablet 2  . EPINEPHrine 0.3 mg/0.3 mL IJ SOAJ injection     .  losartan (COZAAR) 100 MG tablet TAKE 1 TABLET BY MOUTH ONCE DAILY 90 tablet 2  . losartan (COZAAR) 100 MG tablet Take 1 tablet (100 mg total) by mouth daily. 90 tablet 2  . meclizine (ANTIVERT) 25 MG tablet Take 1 tablet (25 mg total) by mouth 3 (three) times daily as needed for dizziness. 30 tablet 1  . omeprazole (PRILOSEC) 20 MG capsule Take 20 mg by mouth every morning.     No current facility-administered medications on file prior to visit.     BP (!) 150/78 (BP Location: Right Arm, Patient Position: Sitting, Cuff Size: Large)   Pulse 66   Temp 98.6 F (37 C) (Oral)   Ht 5' 9.75" (1.772 m)   Wt 262 lb 12.8 oz (119.2 kg)   SpO2 98%   BMI 37.98 kg/m       Objective:   Physical Exam Vitals signs and nursing note reviewed.  Constitutional:      Appearance: Normal appearance.  HENT:     Right Ear: Tympanic membrane, ear canal and external ear normal.     Left Ear: Tympanic membrane, ear canal and external ear normal.     Mouth/Throat:     Mouth: Mucous membranes are moist.     Pharynx: Oropharynx is clear.  Cardiovascular:     Rate and Rhythm: Normal rate and regular rhythm.     Pulses: Normal pulses.     Heart sounds: Normal heart sounds.  Pulmonary:     Effort: Pulmonary effort is normal.     Breath sounds: Wheezing present. No rhonchi.  Chest:     Chest wall: No tenderness.  Skin:    Capillary Refill: Capillary refill takes less than 2 seconds.  Neurological:     General: No focal deficit present.     Mental Status: He is alert and oriented to person, place, and time.  Psychiatric:        Mood and Affect: Mood normal.        Behavior: Behavior normal.        Thought Content: Thought content normal.        Judgment: Judgment normal.       Assessment & Plan:  1. Upper respiratory tract infection, unspecified type - DG Chest 2 View; Future - methylPREDNISolone (MEDROL DOSEPAK) 4 MG TBPK tablet; Take as directed  Dispense: 21 tablet; Refill: 0 -  HYDROcodone-homatropine (HYCODAN) 5-1.5 MG/5ML syrup; Take 5 mLs by mouth every 8 (eight) hours as needed for cough.  Dispense: 120 mL;  Refill: 0 - DG Chest 2 View  Shirline Freesory Ajna Moors, NP

## 2018-04-04 ENCOUNTER — Telehealth: Payer: Self-pay | Admitting: Adult Health

## 2018-04-04 NOTE — Telephone Encounter (Signed)
Left message with patients wife about chest xray

## 2018-05-23 ENCOUNTER — Other Ambulatory Visit: Payer: Self-pay | Admitting: Internal Medicine

## 2018-05-27 ENCOUNTER — Other Ambulatory Visit: Payer: Self-pay | Admitting: Adult Health

## 2018-05-27 NOTE — Telephone Encounter (Signed)
Requested medication (s) are due for refill today: Yes  Requested medication (s) are on the active medication list: Yes  Last refill:  08/07/17   Future visit scheduled: No  Notes to clinic:  Last refill by Dr. Kirtland Bouchard     Requested Prescriptions  Pending Prescriptions Disp Refills   atorvastatin (LIPITOR) 20 MG tablet 90 tablet 2    Sig: Take 1 tablet (20 mg total) by mouth daily.     Cardiovascular:  Antilipid - Statins Failed - 05/27/2018  5:16 PM      Failed - HDL in normal range and within 360 days    HDL  Date Value Ref Range Status  09/30/2017 37.60 (L) >39.00 mg/dL Final         Failed - Triglycerides in normal range and within 360 days    Triglycerides  Date Value Ref Range Status  09/30/2017 158.0 (H) 0.0 - 149.0 mg/dL Final    Comment:    Normal:  <150 mg/dLBorderline High:  150 - 199 mg/dL         Passed - Total Cholesterol in normal range and within 360 days    Cholesterol  Date Value Ref Range Status  09/30/2017 112 0 - 200 mg/dL Final    Comment:    ATP III Classification       Desirable:  < 200 mg/dL               Borderline High:  200 - 239 mg/dL          High:  > = 937 mg/dL         Passed - LDL in normal range and within 360 days    LDL Cholesterol  Date Value Ref Range Status  09/30/2017 43 0 - 99 mg/dL Final         Passed - Patient is not pregnant      Passed - Valid encounter within last 12 months    Recent Outpatient Visits          1 month ago Upper respiratory tract infection, unspecified type   [facility], [place], NP   4 months ago Encounter to establish care   [facility], [place], NP   6 months ago Right ear pain   [facility], [place], NP   6 months ago Acute swimmer's ear of right side   [facility], [place], NP   7 months ago Acute otitis media, unspecified otitis media type   [facility], [place],  NP

## 2018-05-27 NOTE — Telephone Encounter (Signed)
Copied from CRM [id number]. Topic: Quick Communication - Rx Refill/Question >> May 27, 2018  4:53 PM Mcneil, Ja-Kwan wrote: Medication: atorvastatin (LIPITOR) 20 MG tablet  Has the patient contacted their pharmacy? yes   Preferred Pharmacy (with phone number or street name): [facility] [address], [place] - [address] [phone] (Phone)  [phone] (Fax)  Agent: Please be advised that RX refills may take up to 3 business days. We ask that you follow-up with your pharmacy.

## 2018-05-28 MED ORDER — ATORVASTATIN CALCIUM 20 MG PO TABS: 20.0000 mg | ORAL_TABLET | Freq: Every day | ORAL | 1 refills | Status: DC

## 2018-05-28 NOTE — Telephone Encounter (Signed)
Sent to the pharmacy by e-scribe. 

## 2018-06-30 ENCOUNTER — Ambulatory Visit (INDEPENDENT_AMBULATORY_CARE_PROVIDER_SITE_OTHER): Payer: PPO | Admitting: Family Medicine

## 2018-06-30 ENCOUNTER — Other Ambulatory Visit: Payer: Self-pay

## 2018-06-30 DIAGNOSIS — J029 Acute pharyngitis, unspecified: Secondary | ICD-10-CM

## 2018-06-30 NOTE — Progress Notes (Signed)
Virtual Visit via Telephone Note  I connected with David Vincent on 06/30/18 at 1:50 to 2:06 PM by telephone and verified that I am speaking with the correct person using two identifiers.   I discussed the limitations, risks, security and privacy concerns of performing an evaluation and management service by telephone and the availability of in person appointments. I also discussed with the patient that there may be a patient responsible charge related to this service. The patient expressed understanding and agreed to proceed.  Location patient: home Location provider: work or home office Participants present for the call: patient, provider Patient did not have a visit in the prior 7 days to address this/these issue(s).   History of Present Illness: Patient relates 2-day history of sore throat.  He has not had any fever.  Temperature 97 range.  Has had some very mild cough.  No recent sick contacts.  No travel.  No dyspnea.  He has taken some Tylenol and NyQuil which helps somewhat with the sore throat symptoms.  Is keeping down fluids.  No GI symptoms.   Observations/Objective: Patient sounds cheerful and well on the phone. I do not appreciate any SOB. Speech and thought processing are grossly intact. Patient reported vitals:  Assessment and Plan: Probable viral URI.  No red flags such as fever or dyspnea.  -Continue with plenty of fluids and rest -Continue Tylenol as needed for sore throat symptoms  Follow Up Instructions:  -Follow-up promptly for any increasing fever, dyspnea, or other concerns   [id number] 5-10 [id number] 11-20 [id number] 21-30 I did not refer this patient for an OV in the next 24 hours for this/these issue(s).  I discussed the assessment and treatment plan with the patient. The patient was provided an opportunity to ask questions and all were answered. The patient agreed with the plan and demonstrated an understanding of the instructions.   The patient was advised to  call back or seek an in-person evaluation if the symptoms worsen or if the condition fails to improve as anticipated.  I provided 11 minutes of non-face-to-face time during this encounter.   Evelena Peat, MD

## 2018-07-09 ENCOUNTER — Ambulatory Visit: Payer: PPO | Admitting: Adult Health

## 2018-07-10 ENCOUNTER — Ambulatory Visit (INDEPENDENT_AMBULATORY_CARE_PROVIDER_SITE_OTHER): Payer: PPO | Admitting: Adult Health

## 2018-07-10 ENCOUNTER — Encounter: Payer: Self-pay | Admitting: Adult Health

## 2018-07-10 ENCOUNTER — Telehealth: Payer: Self-pay | Admitting: Adult Health

## 2018-07-10 ENCOUNTER — Other Ambulatory Visit: Payer: Self-pay

## 2018-07-10 DIAGNOSIS — J069 Acute upper respiratory infection, unspecified: Secondary | ICD-10-CM

## 2018-07-10 NOTE — Telephone Encounter (Signed)
Entered in error

## 2018-07-10 NOTE — Progress Notes (Signed)
Virtual Visit via Video Note  I connected with David Vincent on 07/10/18 at  2:00 PM EDT by a video enabled telemedicine application and verified that I am speaking with the correct person using two identifiers.  Location patient: home Location provider:work or home office Persons participating in the virtual visit: patient, provider  I discussed the limitations of evaluation and management by telemedicine and the availability of in person appointments. The patient expressed understanding and agreed to proceed.   HPI:  Patient evaluated 10 days ago, at that time 2-day history of sore throat.  He had not had any fever at this time.  With mild cough.  Symptoms were diagnosis probable viral URI.  Was advised to continue to drink plenty of fluids and rest and take Tylenol as needed for symptom relief.  Yesterday he made an appointment due to continued feeling of a " lump" in his throat, painful swallowing, and bilateral ear pain. Today during conversation he reports that he woke up without symptoms. Denies fevers, chills, painful swallowing, lump in throat, or ear pain at this time   ROS: See pertinent positives and negatives per HPI.  Past Medical History:  Diagnosis Date  . BACK PAIN 03/10/2007  . CAD (coronary artery disease)    A.  01/05/2002 Cath - nonobs dzs (LAD 20prox, RI 30-40 prox)  . ED (erectile dysfunction)   . GERD (gastroesophageal reflux disease)   . History of mumps orchitis   . HYPERTENSION 11/15/2006  . INGUINAL HERNIA, RIGHT 12/05/2009  . LEG CRAMPS 12/03/2008  . SLEEP APNEA 11/16/2008  . Unstable angina (HCC)   . VERTIGO, POSITIONAL 08/08/2009    Past Surgical History:  Procedure Laterality Date  . CARDIAC CATHETERIZATION    . FOOT SURGERY Right 2019  . KNEE ARTHROSCOPY     Right    Family History  Problem Relation Age of Onset  . Pneumonia Mother        died 67 - hip fx/pna  . Heart attack Father        died 27  . Hypertension Sister        alive  . Cancer  Brother        died - agent orange exposure  . Hypertension Brother        alive      Current Outpatient Medications:  .  atorvastatin (LIPITOR) 20 MG tablet, Take 1 tablet (20 mg total) by mouth daily., Disp: 90 tablet, Rfl: 1 .  EPINEPHrine 0.3 mg/0.3 mL IJ SOAJ injection, , Disp: , Rfl:  .  HYDROcodone-homatropine (HYCODAN) 5-1.5 MG/5ML syrup, Take 5 mLs by mouth every 8 (eight) hours as needed for cough., Disp: 120 mL, Rfl: 0 .  losartan (COZAAR) 100 MG tablet, TAKE 1 TABLET BY MOUTH ONCE DAILY, Disp: 90 tablet, Rfl: 2 .  losartan (COZAAR) 100 MG tablet, Take 1 tablet (100 mg total) by mouth daily., Disp: 90 tablet, Rfl: 2 .  meclizine (ANTIVERT) 25 MG tablet, Take 1 tablet (25 mg total) by mouth 3 (three) times daily as needed for dizziness., Disp: 30 tablet, Rfl: 1 .  methylPREDNISolone (MEDROL DOSEPAK) 4 MG TBPK tablet, Take as directed, Disp: 21 tablet, Rfl: 0 .  omeprazole (PRILOSEC) 20 MG capsule, Take 20 mg by mouth every morning., Disp: , Rfl:   EXAM:  VITALS per patient if applicable:  GENERAL: alert, oriented, appears well and in no acute distress  HEENT: atraumatic, conjunttiva clear, no obvious abnormalities on inspection of external nose and ears  NECK: normal movements of the head and neck  LUNGS: on inspection no signs of respiratory distress, breathing rate appears normal, no obvious gross SOB, gasping or wheezing  CV: no obvious cyanosis  MS: moves all visible extremities without noticeable abnormality  PSYCH/NEURO: pleasant and cooperative, no obvious depression or anxiety, speech and thought processing grossly intact  ASSESSMENT AND PLAN:  Discussed the following assessment and plan:  Viral URI  - seems to be resolved. Advised follow up if symptoms return     I discussed the assessment and treatment plan with the patient. The patient was provided an opportunity to ask questions and all were answered. The patient agreed with the plan and demonstrated  an understanding of the instructions.   The patient was advised to call back or seek an in-person evaluation if the symptoms worsen or if the condition fails to improve as anticipated.   [name], NP

## 2018-09-18 DIAGNOSIS — M545 Low back pain: Secondary | ICD-10-CM | POA: Diagnosis not present

## 2018-09-18 DIAGNOSIS — M25551 Pain in right hip: Secondary | ICD-10-CM | POA: Diagnosis not present

## 2018-09-23 DIAGNOSIS — M50322 Other cervical disc degeneration at C5-C6 level: Secondary | ICD-10-CM | POA: Diagnosis not present

## 2018-09-23 DIAGNOSIS — M5417 Radiculopathy, lumbosacral region: Secondary | ICD-10-CM | POA: Diagnosis not present

## 2018-09-23 DIAGNOSIS — R51 Headache: Secondary | ICD-10-CM | POA: Diagnosis not present

## 2018-09-23 DIAGNOSIS — M9902 Segmental and somatic dysfunction of thoracic region: Secondary | ICD-10-CM | POA: Diagnosis not present

## 2018-09-23 DIAGNOSIS — Q72891 Other reduction defects of right lower limb: Secondary | ICD-10-CM | POA: Diagnosis not present

## 2018-09-23 DIAGNOSIS — M9903 Segmental and somatic dysfunction of lumbar region: Secondary | ICD-10-CM | POA: Diagnosis not present

## 2018-09-23 DIAGNOSIS — M50321 Other cervical disc degeneration at C4-C5 level: Secondary | ICD-10-CM | POA: Diagnosis not present

## 2018-09-23 DIAGNOSIS — M9901 Segmental and somatic dysfunction of cervical region: Secondary | ICD-10-CM | POA: Diagnosis not present

## 2018-09-23 DIAGNOSIS — M531 Cervicobrachial syndrome: Secondary | ICD-10-CM | POA: Diagnosis not present

## 2018-09-23 DIAGNOSIS — M5137 Other intervertebral disc degeneration, lumbosacral region: Secondary | ICD-10-CM | POA: Diagnosis not present

## 2018-09-23 DIAGNOSIS — M9905 Segmental and somatic dysfunction of pelvic region: Secondary | ICD-10-CM | POA: Diagnosis not present

## 2018-09-23 DIAGNOSIS — M9904 Segmental and somatic dysfunction of sacral region: Secondary | ICD-10-CM | POA: Diagnosis not present

## 2018-09-24 DIAGNOSIS — M9901 Segmental and somatic dysfunction of cervical region: Secondary | ICD-10-CM | POA: Diagnosis not present

## 2018-09-24 DIAGNOSIS — M50321 Other cervical disc degeneration at C4-C5 level: Secondary | ICD-10-CM | POA: Diagnosis not present

## 2018-09-24 DIAGNOSIS — M9903 Segmental and somatic dysfunction of lumbar region: Secondary | ICD-10-CM | POA: Diagnosis not present

## 2018-09-24 DIAGNOSIS — M531 Cervicobrachial syndrome: Secondary | ICD-10-CM | POA: Diagnosis not present

## 2018-09-24 DIAGNOSIS — M9905 Segmental and somatic dysfunction of pelvic region: Secondary | ICD-10-CM | POA: Diagnosis not present

## 2018-09-24 DIAGNOSIS — M5137 Other intervertebral disc degeneration, lumbosacral region: Secondary | ICD-10-CM | POA: Diagnosis not present

## 2018-09-24 DIAGNOSIS — Q72891 Other reduction defects of right lower limb: Secondary | ICD-10-CM | POA: Diagnosis not present

## 2018-09-24 DIAGNOSIS — M50322 Other cervical disc degeneration at C5-C6 level: Secondary | ICD-10-CM | POA: Diagnosis not present

## 2018-09-24 DIAGNOSIS — M9904 Segmental and somatic dysfunction of sacral region: Secondary | ICD-10-CM | POA: Diagnosis not present

## 2018-09-24 DIAGNOSIS — R51 Headache: Secondary | ICD-10-CM | POA: Diagnosis not present

## 2018-09-24 DIAGNOSIS — M5417 Radiculopathy, lumbosacral region: Secondary | ICD-10-CM | POA: Diagnosis not present

## 2018-09-24 DIAGNOSIS — M9902 Segmental and somatic dysfunction of thoracic region: Secondary | ICD-10-CM | POA: Diagnosis not present

## 2018-09-25 DIAGNOSIS — M9901 Segmental and somatic dysfunction of cervical region: Secondary | ICD-10-CM | POA: Diagnosis not present

## 2018-09-25 DIAGNOSIS — M5417 Radiculopathy, lumbosacral region: Secondary | ICD-10-CM | POA: Diagnosis not present

## 2018-09-25 DIAGNOSIS — M9903 Segmental and somatic dysfunction of lumbar region: Secondary | ICD-10-CM | POA: Diagnosis not present

## 2018-09-25 DIAGNOSIS — M50322 Other cervical disc degeneration at C5-C6 level: Secondary | ICD-10-CM | POA: Diagnosis not present

## 2018-09-25 DIAGNOSIS — M531 Cervicobrachial syndrome: Secondary | ICD-10-CM | POA: Diagnosis not present

## 2018-09-25 DIAGNOSIS — M50321 Other cervical disc degeneration at C4-C5 level: Secondary | ICD-10-CM | POA: Diagnosis not present

## 2018-09-25 DIAGNOSIS — Q72891 Other reduction defects of right lower limb: Secondary | ICD-10-CM | POA: Diagnosis not present

## 2018-09-25 DIAGNOSIS — M9904 Segmental and somatic dysfunction of sacral region: Secondary | ICD-10-CM | POA: Diagnosis not present

## 2018-09-25 DIAGNOSIS — M9905 Segmental and somatic dysfunction of pelvic region: Secondary | ICD-10-CM | POA: Diagnosis not present

## 2018-09-25 DIAGNOSIS — M9902 Segmental and somatic dysfunction of thoracic region: Secondary | ICD-10-CM | POA: Diagnosis not present

## 2018-09-25 DIAGNOSIS — R51 Headache: Secondary | ICD-10-CM | POA: Diagnosis not present

## 2018-09-25 DIAGNOSIS — M5137 Other intervertebral disc degeneration, lumbosacral region: Secondary | ICD-10-CM | POA: Diagnosis not present

## 2018-09-29 DIAGNOSIS — M9901 Segmental and somatic dysfunction of cervical region: Secondary | ICD-10-CM | POA: Diagnosis not present

## 2018-09-29 DIAGNOSIS — M9902 Segmental and somatic dysfunction of thoracic region: Secondary | ICD-10-CM | POA: Diagnosis not present

## 2018-09-29 DIAGNOSIS — M5137 Other intervertebral disc degeneration, lumbosacral region: Secondary | ICD-10-CM | POA: Diagnosis not present

## 2018-09-29 DIAGNOSIS — M9904 Segmental and somatic dysfunction of sacral region: Secondary | ICD-10-CM | POA: Diagnosis not present

## 2018-09-29 DIAGNOSIS — M9903 Segmental and somatic dysfunction of lumbar region: Secondary | ICD-10-CM | POA: Diagnosis not present

## 2018-09-29 DIAGNOSIS — M531 Cervicobrachial syndrome: Secondary | ICD-10-CM | POA: Diagnosis not present

## 2018-09-29 DIAGNOSIS — Q72891 Other reduction defects of right lower limb: Secondary | ICD-10-CM | POA: Diagnosis not present

## 2018-09-29 DIAGNOSIS — M50321 Other cervical disc degeneration at C4-C5 level: Secondary | ICD-10-CM | POA: Diagnosis not present

## 2018-09-29 DIAGNOSIS — R51 Headache: Secondary | ICD-10-CM | POA: Diagnosis not present

## 2018-09-29 DIAGNOSIS — M50322 Other cervical disc degeneration at C5-C6 level: Secondary | ICD-10-CM | POA: Diagnosis not present

## 2018-09-29 DIAGNOSIS — M5417 Radiculopathy, lumbosacral region: Secondary | ICD-10-CM | POA: Diagnosis not present

## 2018-09-29 DIAGNOSIS — M9905 Segmental and somatic dysfunction of pelvic region: Secondary | ICD-10-CM | POA: Diagnosis not present

## 2018-10-01 DIAGNOSIS — M9903 Segmental and somatic dysfunction of lumbar region: Secondary | ICD-10-CM | POA: Diagnosis not present

## 2018-10-01 DIAGNOSIS — M9901 Segmental and somatic dysfunction of cervical region: Secondary | ICD-10-CM | POA: Diagnosis not present

## 2018-10-01 DIAGNOSIS — M9904 Segmental and somatic dysfunction of sacral region: Secondary | ICD-10-CM | POA: Diagnosis not present

## 2018-10-01 DIAGNOSIS — M5417 Radiculopathy, lumbosacral region: Secondary | ICD-10-CM | POA: Diagnosis not present

## 2018-10-01 DIAGNOSIS — Q72891 Other reduction defects of right lower limb: Secondary | ICD-10-CM | POA: Diagnosis not present

## 2018-10-01 DIAGNOSIS — M50321 Other cervical disc degeneration at C4-C5 level: Secondary | ICD-10-CM | POA: Diagnosis not present

## 2018-10-01 DIAGNOSIS — M9902 Segmental and somatic dysfunction of thoracic region: Secondary | ICD-10-CM | POA: Diagnosis not present

## 2018-10-01 DIAGNOSIS — R51 Headache: Secondary | ICD-10-CM | POA: Diagnosis not present

## 2018-10-01 DIAGNOSIS — M50322 Other cervical disc degeneration at C5-C6 level: Secondary | ICD-10-CM | POA: Diagnosis not present

## 2018-10-01 DIAGNOSIS — M9905 Segmental and somatic dysfunction of pelvic region: Secondary | ICD-10-CM | POA: Diagnosis not present

## 2018-10-01 DIAGNOSIS — M5137 Other intervertebral disc degeneration, lumbosacral region: Secondary | ICD-10-CM | POA: Diagnosis not present

## 2018-10-01 DIAGNOSIS — M531 Cervicobrachial syndrome: Secondary | ICD-10-CM | POA: Diagnosis not present

## 2018-10-06 DIAGNOSIS — M531 Cervicobrachial syndrome: Secondary | ICD-10-CM | POA: Diagnosis not present

## 2018-10-06 DIAGNOSIS — M5417 Radiculopathy, lumbosacral region: Secondary | ICD-10-CM | POA: Diagnosis not present

## 2018-10-06 DIAGNOSIS — M9902 Segmental and somatic dysfunction of thoracic region: Secondary | ICD-10-CM | POA: Diagnosis not present

## 2018-10-06 DIAGNOSIS — M9903 Segmental and somatic dysfunction of lumbar region: Secondary | ICD-10-CM | POA: Diagnosis not present

## 2018-10-06 DIAGNOSIS — M5137 Other intervertebral disc degeneration, lumbosacral region: Secondary | ICD-10-CM | POA: Diagnosis not present

## 2018-10-06 DIAGNOSIS — M50322 Other cervical disc degeneration at C5-C6 level: Secondary | ICD-10-CM | POA: Diagnosis not present

## 2018-10-06 DIAGNOSIS — M9901 Segmental and somatic dysfunction of cervical region: Secondary | ICD-10-CM | POA: Diagnosis not present

## 2018-10-06 DIAGNOSIS — M9904 Segmental and somatic dysfunction of sacral region: Secondary | ICD-10-CM | POA: Diagnosis not present

## 2018-10-06 DIAGNOSIS — Q72891 Other reduction defects of right lower limb: Secondary | ICD-10-CM | POA: Diagnosis not present

## 2018-10-06 DIAGNOSIS — R51 Headache: Secondary | ICD-10-CM | POA: Diagnosis not present

## 2018-10-06 DIAGNOSIS — M9905 Segmental and somatic dysfunction of pelvic region: Secondary | ICD-10-CM | POA: Diagnosis not present

## 2018-10-06 DIAGNOSIS — M50321 Other cervical disc degeneration at C4-C5 level: Secondary | ICD-10-CM | POA: Diagnosis not present

## 2018-10-15 DIAGNOSIS — M9903 Segmental and somatic dysfunction of lumbar region: Secondary | ICD-10-CM | POA: Diagnosis not present

## 2018-10-15 DIAGNOSIS — R51 Headache: Secondary | ICD-10-CM | POA: Diagnosis not present

## 2018-10-15 DIAGNOSIS — M9905 Segmental and somatic dysfunction of pelvic region: Secondary | ICD-10-CM | POA: Diagnosis not present

## 2018-10-15 DIAGNOSIS — M50321 Other cervical disc degeneration at C4-C5 level: Secondary | ICD-10-CM | POA: Diagnosis not present

## 2018-10-15 DIAGNOSIS — Q72891 Other reduction defects of right lower limb: Secondary | ICD-10-CM | POA: Diagnosis not present

## 2018-10-15 DIAGNOSIS — M531 Cervicobrachial syndrome: Secondary | ICD-10-CM | POA: Diagnosis not present

## 2018-10-15 DIAGNOSIS — M5417 Radiculopathy, lumbosacral region: Secondary | ICD-10-CM | POA: Diagnosis not present

## 2018-10-15 DIAGNOSIS — M9902 Segmental and somatic dysfunction of thoracic region: Secondary | ICD-10-CM | POA: Diagnosis not present

## 2018-10-15 DIAGNOSIS — M5137 Other intervertebral disc degeneration, lumbosacral region: Secondary | ICD-10-CM | POA: Diagnosis not present

## 2018-10-15 DIAGNOSIS — M9901 Segmental and somatic dysfunction of cervical region: Secondary | ICD-10-CM | POA: Diagnosis not present

## 2018-10-15 DIAGNOSIS — M50322 Other cervical disc degeneration at C5-C6 level: Secondary | ICD-10-CM | POA: Diagnosis not present

## 2018-10-15 DIAGNOSIS — M9904 Segmental and somatic dysfunction of sacral region: Secondary | ICD-10-CM | POA: Diagnosis not present

## 2018-10-22 DIAGNOSIS — Q72891 Other reduction defects of right lower limb: Secondary | ICD-10-CM | POA: Diagnosis not present

## 2018-10-22 DIAGNOSIS — M5417 Radiculopathy, lumbosacral region: Secondary | ICD-10-CM | POA: Diagnosis not present

## 2018-10-22 DIAGNOSIS — M531 Cervicobrachial syndrome: Secondary | ICD-10-CM | POA: Diagnosis not present

## 2018-10-22 DIAGNOSIS — M5137 Other intervertebral disc degeneration, lumbosacral region: Secondary | ICD-10-CM | POA: Diagnosis not present

## 2018-10-22 DIAGNOSIS — M9901 Segmental and somatic dysfunction of cervical region: Secondary | ICD-10-CM | POA: Diagnosis not present

## 2018-10-22 DIAGNOSIS — M50322 Other cervical disc degeneration at C5-C6 level: Secondary | ICD-10-CM | POA: Diagnosis not present

## 2018-10-22 DIAGNOSIS — R51 Headache: Secondary | ICD-10-CM | POA: Diagnosis not present

## 2018-10-22 DIAGNOSIS — M50321 Other cervical disc degeneration at C4-C5 level: Secondary | ICD-10-CM | POA: Diagnosis not present

## 2018-10-22 DIAGNOSIS — M9902 Segmental and somatic dysfunction of thoracic region: Secondary | ICD-10-CM | POA: Diagnosis not present

## 2018-10-22 DIAGNOSIS — M9904 Segmental and somatic dysfunction of sacral region: Secondary | ICD-10-CM | POA: Diagnosis not present

## 2018-10-22 DIAGNOSIS — M9903 Segmental and somatic dysfunction of lumbar region: Secondary | ICD-10-CM | POA: Diagnosis not present

## 2018-10-22 DIAGNOSIS — M9905 Segmental and somatic dysfunction of pelvic region: Secondary | ICD-10-CM | POA: Diagnosis not present

## 2018-11-26 ENCOUNTER — Other Ambulatory Visit: Payer: Self-pay | Admitting: Adult Health

## 2018-11-27 ENCOUNTER — Encounter: Payer: Self-pay | Admitting: Family Medicine

## 2018-11-27 NOTE — Telephone Encounter (Signed)
Sent to the pharmacy by e-scribe for 90 days.  Letter mailed to the pt.  He is due for cpx and fasting lab work.

## 2019-01-11 ENCOUNTER — Other Ambulatory Visit: Payer: Self-pay | Admitting: Adult Health

## 2019-01-13 NOTE — Telephone Encounter (Signed)
SENT TO THE PHARMACY BY E-SCRIBE.  PT HAS UPCOMING CPX. 

## 2019-01-21 ENCOUNTER — Other Ambulatory Visit: Payer: Self-pay

## 2019-01-22 ENCOUNTER — Encounter: Payer: Self-pay | Admitting: Adult Health

## 2019-01-22 ENCOUNTER — Telehealth: Payer: Self-pay | Admitting: Adult Health

## 2019-01-22 ENCOUNTER — Ambulatory Visit (INDEPENDENT_AMBULATORY_CARE_PROVIDER_SITE_OTHER): Payer: PPO | Admitting: Adult Health

## 2019-01-22 VITALS — BP 140/80 | Temp 98.6°F | Ht 70.0 in | Wt 259.0 lb

## 2019-01-22 DIAGNOSIS — Z0000 Encounter for general adult medical examination without abnormal findings: Secondary | ICD-10-CM

## 2019-01-22 DIAGNOSIS — I10 Essential (primary) hypertension: Secondary | ICD-10-CM | POA: Diagnosis not present

## 2019-01-22 DIAGNOSIS — I2510 Atherosclerotic heart disease of native coronary artery without angina pectoris: Secondary | ICD-10-CM | POA: Diagnosis not present

## 2019-01-22 DIAGNOSIS — Z8249 Family history of ischemic heart disease and other diseases of the circulatory system: Secondary | ICD-10-CM

## 2019-01-22 DIAGNOSIS — G4730 Sleep apnea, unspecified: Secondary | ICD-10-CM

## 2019-01-22 DIAGNOSIS — K219 Gastro-esophageal reflux disease without esophagitis: Secondary | ICD-10-CM | POA: Diagnosis not present

## 2019-01-22 DIAGNOSIS — Z125 Encounter for screening for malignant neoplasm of prostate: Secondary | ICD-10-CM

## 2019-01-22 DIAGNOSIS — H8113 Benign paroxysmal vertigo, bilateral: Secondary | ICD-10-CM

## 2019-01-22 DIAGNOSIS — Z23 Encounter for immunization: Secondary | ICD-10-CM | POA: Diagnosis not present

## 2019-01-22 LAB — COMPREHENSIVE METABOLIC PANEL
ALT: 14 U/L (ref 0–53)
AST: 18 U/L (ref 0–37)
Albumin: 4.5 g/dL (ref 3.5–5.2)
Alkaline Phosphatase: 89 U/L (ref 39–117)
BUN: 21 mg/dL (ref 6–23)
CO2: 28 mEq/L (ref 19–32)
Calcium: 9.8 mg/dL (ref 8.4–10.5)
Chloride: 104 mEq/L (ref 96–112)
Creatinine, Ser: 0.96 mg/dL (ref 0.40–1.50)
GFR: 77.87 mL/min (ref >60.00)
Glucose, Bld: 90 mg/dL (ref 70–99)
Potassium: 4.7 mEq/L (ref 3.5–5.1)
Sodium: 140 mEq/L (ref 135–145)
Total Bilirubin: 0.8 mg/dL (ref 0.2–1.2)
Total Protein: 6.8 g/dL (ref 6.0–8.3)

## 2019-01-22 LAB — CBC WITH DIFFERENTIAL/PLATELET
Basophils Absolute: 0 10*3/uL (ref 0.0–0.1)
Basophils Relative: 0.7 % (ref 0.0–3.0)
Eosinophils Absolute: 0.4 10*3/uL (ref 0.0–0.7)
Eosinophils Relative: 5.4 % — ABNORMAL HIGH (ref 0.0–5.0)
HCT: 41.9 % (ref 39.0–52.0)
Hemoglobin: 14.4 g/dL (ref 13.0–17.0)
Lymphocytes Relative: 33.6 % (ref 12.0–46.0)
Lymphs Abs: 2.3 10*3/uL (ref 0.7–4.0)
MCHC: 34.3 g/dL (ref 30.0–36.0)
MCV: 95.2 fl (ref 78.0–100.0)
Monocytes Absolute: 0.6 10*3/uL (ref 0.1–1.0)
Monocytes Relative: 8.3 % (ref 3.0–12.0)
Neutro Abs: 3.5 10*3/uL (ref 1.4–7.7)
Neutrophils Relative %: 52 % (ref 43.0–77.0)
Platelets: 248 10*3/uL (ref 150.0–400.0)
RBC: 4.41 Mil/uL (ref 4.22–5.81)
RDW: 13.4 % (ref 11.5–15.5)
WBC: 6.8 10*3/uL (ref 4.0–10.5)

## 2019-01-22 LAB — LIPID PANEL
Cholesterol: 97 mg/dL (ref 0–200)
HDL: 34.4 mg/dL — ABNORMAL LOW (ref >39.00)
LDL Cholesterol: 40 mg/dL (ref 0–99)
NonHDL: 62.76
Total CHOL/HDL Ratio: 3
Triglycerides: 115 mg/dL (ref 0.0–149.0)
VLDL: 23 mg/dL (ref 0.0–40.0)

## 2019-01-22 LAB — PSA: PSA: 0.48 ng/mL (ref 0.10–4.00)

## 2019-01-22 LAB — TSH: TSH: 1.04 u[IU]/mL (ref 0.35–4.50)

## 2019-01-22 MED ORDER — OMEPRAZOLE 20 MG PO CPDR: 20.0000 mg | DELAYED_RELEASE_CAPSULE | ORAL | 3 refills | Status: DC

## 2019-01-22 NOTE — Telephone Encounter (Signed)
Updated patient on his labs  

## 2019-01-22 NOTE — Progress Notes (Signed)
Subjective:    Patient ID: David Vincent, male    DOB: November 25, 1950, 68 y.o.   MRN: [id number]  HPI Patient presents for yearly preventative medicine examination. He is a pleasant 68 year old male who  has a past medical history of BACK PAIN (03/10/2007), CAD (coronary artery disease), ED (erectile dysfunction), GERD (gastroesophageal reflux disease), History of mumps orchitis, HYPERTENSION (11/15/2006), INGUINAL HERNIA, RIGHT (12/05/2009), LEG CRAMPS (12/03/2008), SLEEP APNEA (11/16/2008), Unstable angina (HCC), and VERTIGO, POSITIONAL (08/08/2009).  Hypertension-well controlled with Cozaar 100 mg daily.  He denies chest pain, shortness of breath, headache, dizziness, lightheadedness, or syncopal episodes BP Readings from Last 3 Encounters:  01/22/19 140/80  04/03/18 (!) 150/78  12/31/17 120/80    Hyperlipidemia/CAD-he is prescribed Lipitor 20 mg daily.  He denies myalgia or fatigue Lab Results  Component Value Date   CHOL 112 09/30/2017   HDL 37.60 (L) 09/30/2017   LDLCALC 43 09/30/2017   TRIG 158.0 (H) 09/30/2017   CHOLHDL 3 09/30/2017    GERD-controlled with Prilosec 20 mg  Vertigo -takes meclizine as needed.  Last refill was 12/31/2017  Sleep Apnea -refuses CPAP  Family history of brain aneurysm - he has had three relatives ( brother and uncles) die suddenly from a brain aneurysm. He would like to get checked out. He does not get headaches frequently but does have blurred vision doe to poor vision and having to wear glasses.    All immunizations and health maintenance protocols were reviewed with the patient and needed orders were placed. He is due for influenza vaccination   Appropriate screening laboratory values were ordered for the patient including screening of hyperlipidemia, renal function and hepatic function. If indicated by BPH, a PSA was ordered.  Medication reconciliation,  past medical history, social history, problem list and allergies were reviewed in detail with the  patient  Goals were established with regard to weight loss, exercise, and  diet in compliance with medications. He is actively working on weight loss through diet and exercise  Wt Readings from Last 3 Encounters:  01/22/19 259 lb (117.5 kg)  04/03/18 262 lb 12.8 oz (119.2 kg)  12/31/17 270 lb 3.2 oz (122.6 kg)   He is up to date on routine screening colonoscopy,   Review of Systems  Constitutional: Negative.   HENT: Negative.   Eyes: Negative.   Respiratory: Negative.   Cardiovascular: Negative.   Gastrointestinal: Negative.   Endocrine: Negative.   Genitourinary: Negative.   Musculoskeletal: Negative.   Skin: Negative.   Allergic/Immunologic: Negative.   Neurological: Negative.   Hematological: Negative.   Psychiatric/Behavioral: Negative.   All other systems reviewed and are negative.  Past Medical History:  Diagnosis Date  . BACK PAIN 03/10/2007  . CAD (coronary artery disease)    A.  01/05/2002 Cath - nonobs dzs (LAD 20prox, RI 30-40 prox)  . ED (erectile dysfunction)   . GERD (gastroesophageal reflux disease)   . History of mumps orchitis   . HYPERTENSION 11/15/2006  . INGUINAL HERNIA, RIGHT 12/05/2009  . LEG CRAMPS 12/03/2008  . SLEEP APNEA 11/16/2008  . Unstable angina (HCC)   . VERTIGO, POSITIONAL 08/08/2009    Social History   Socioeconomic History  . Marital status: Married    Spouse name: Not on file  . Number of children: Not on file  . Years of education: Not on file  . Highest education level: Not on file  Occupational History  . Not on file  Social Needs  . Physicist, medical  strain: Not on file  . Food insecurity    Worry: Not on file    Inability: Not on file  . Transportation needs    Medical: Not on file    Non-medical: Not on file  Tobacco Use  . Smoking status: Never Smoker  . Smokeless tobacco: Never Used  Substance and Sexual Activity  . Alcohol use: No  . Drug use: No  . Sexual activity: Not on file  Lifestyle  . Physical activity     Days per week: Not on file    Minutes per session: Not on file  . Stress: Not on file  Relationships  . Social Musician on phone: Not on file    Gets together: Not on file    Attends religious service: Not on file    Active member of club or organization: Not on file    Attends meetings of clubs or organizations: Not on file    Relationship status: Not on file  . Intimate partner violence    Fear of current or ex partner: Not on file    Emotionally abused: Not on file    Physically abused: Not on file    Forced sexual activity: Not on file  Other Topics Concern  . Not on file  Social History Narrative   05/02/2011:  Currently unemployed.  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  Does not exercise or adhere to any specific diet.    Past Surgical History:  Procedure Laterality Date  . CARDIAC CATHETERIZATION    . FOOT SURGERY Right 2019  . KNEE ARTHROSCOPY     Right    Family History  Problem Relation Age of Onset  . Pneumonia Mother        died 60 - hip fx/pna  . Heart attack Father        died 34  . Hypertension Sister        alive  . Cancer Brother        died - agent orange exposure  . Hypertension Brother        alive    Allergies  Allergen Reactions  . Bee Venom Anaphylaxis  . Codeine Phosphate Itching  . Penicillins Other (See Comments)    Has patient had a PCN reaction causing immediate rash, facial/tongue/throat swelling, SOB or lightheadedness with hypotension: Unknown, childhood reaction Has patient had a PCN reaction causing severe rash involving mucus membranes or skin necrosis: No Has patient had a PCN reaction that required hospitalization No Has patient had a PCN reaction occurring within the last 10 years: No If all of the above answers are "NO", then may proceed with Cephalosporin use.   . Prednisone Itching    Current Outpatient Medications on File Prior to Visit  Medication Sig Dispense Refill  . atorvastatin  (LIPITOR) 20 MG tablet Take 1 tablet by mouth once daily 90 tablet 0  . EPINEPHrine 0.3 mg/0.3 mL IJ SOAJ injection     . losartan (COZAAR) 100 MG tablet Take 1 tablet by mouth once daily 90 tablet 0  . meclizine (ANTIVERT) 25 MG tablet Take 1 tablet (25 mg total) by mouth 3 (three) times daily as needed for dizziness. 30 tablet 1  . omeprazole (PRILOSEC) 20 MG capsule Take 20 mg by mouth every morning.     No current facility-administered medications on file prior to visit.     BP 140/80   Temp 98.6 F (37  C) (Oral)   Ht 5\' 10"  (1.778 m)   Wt 259 lb (117.5 kg)   BMI 37.16 kg/m       Objective:   Physical Exam Vitals signs and nursing note reviewed.  Constitutional:      General: He is not in acute distress.    Appearance: Normal appearance. He is well-developed. He is not diaphoretic.  HENT:     Head: Normocephalic and atraumatic.     Right Ear: Tympanic membrane, ear canal and external ear normal. There is no impacted cerumen.     Left Ear: Tympanic membrane, ear canal and external ear normal. There is no impacted cerumen.     Nose: Nose normal. No congestion or rhinorrhea.     Mouth/Throat:     Mouth: Mucous membranes are moist.     Pharynx: Oropharynx is clear. No oropharyngeal exudate.  Eyes:     General:        Right eye: No discharge.        Left eye: No discharge.     Conjunctiva/sclera: Conjunctivae normal.     Pupils: Pupils are equal, round, and reactive to light.  Neck:     Thyroid: No thyromegaly.     Trachea: No tracheal deviation.  Cardiovascular:     Rate and Rhythm: Normal rate and regular rhythm.     Heart sounds: Normal heart sounds. No murmur. No friction rub. No gallop.   Pulmonary:     Effort: Pulmonary effort is normal. No respiratory distress.     Breath sounds: Normal breath sounds. No stridor. No wheezing, rhonchi or rales.  Chest:     Chest wall: No tenderness.  Abdominal:     General: Bowel sounds are normal. There is no distension.      Palpations: Abdomen is soft. There is no mass.     Tenderness: There is no abdominal tenderness. There is no right CVA tenderness, left CVA tenderness, guarding or rebound.     Hernia: No hernia is present.  Musculoskeletal: Normal range of motion.        General: No swelling, tenderness, deformity or signs of injury.     Right lower leg: No edema.     Left lower leg: No edema.  Lymphadenopathy:     Cervical: No cervical adenopathy.  Skin:    General: Skin is warm and dry.     Coloration: Skin is not jaundiced or pale.     Findings: No bruising, erythema, lesion or rash.  Neurological:     General: No focal deficit present.     Mental Status: He is alert and oriented to person, place, and time. Mental status is at baseline.     Cranial Nerves: No cranial nerve deficit.     Sensory: No sensory deficit.     Motor: No weakness.     Coordination: Coordination normal.     Gait: Gait normal.     Deep Tendon Reflexes: Reflexes normal.  Psychiatric:        Mood and Affect: Mood normal.        Behavior: Behavior normal.        Thought Content: Thought content normal.        Judgment: Judgment normal.       Assessment & Plan:  1. Routine general medical examination at a health care facility - Congratulated on weight loss through diet and exercise - Follow up in one year or or sooner if needed - CBC with Differential/Platelet - Comprehensive  metabolic panel - Lipid panel - TSH  2. Essential hypertension - Well controlled. No change in therapy  - CBC with Differential/Platelet - Comprehensive metabolic panel - Lipid panel - TSH  3. Coronary artery disease involving native coronary artery of native heart without angina pectoris - Continue with statin and lifestyle modifications  - CBC with Differential/Platelet - Comprehensive metabolic panel - Lipid panel - TSH  4. Sleep apnea, unspecified type - refuses cpap  - CBC with Differential/Platelet - Comprehensive metabolic  panel - Lipid panel - TSH  5. Gastroesophageal reflux disease, unspecified whether esophagitis present - Continue with Prilosec  - CBC with Differential/Platelet - Comprehensive metabolic panel - Lipid panel - TSH  6. Benign paroxysmal positional vertigo due to bilateral vestibular disorder - Continue with Meclizine PRN  - CBC with Differential/Platelet - Comprehensive metabolic panel - Lipid panel - TSH  7. Prostate cancer screening  - PSA  8. Family history of brain aneurysm  - CT ANGIO HEAD W OR WO CONTRAST; Future  Shirline Freesory Zander Ingham, NP

## 2019-01-22 NOTE — Addendum Note (Signed)
Addended by: Miles Costain T on: 01/22/2019 11:52 AM   Modules accepted: Orders

## 2019-01-28 ENCOUNTER — Telehealth: Payer: Self-pay | Admitting: Adult Health

## 2019-01-28 ENCOUNTER — Telehealth (INDEPENDENT_AMBULATORY_CARE_PROVIDER_SITE_OTHER): Payer: PPO | Admitting: Adult Health

## 2019-01-28 ENCOUNTER — Encounter: Payer: Self-pay | Admitting: Adult Health

## 2019-01-28 ENCOUNTER — Other Ambulatory Visit: Payer: Self-pay

## 2019-01-28 DIAGNOSIS — B027 Disseminated zoster: Secondary | ICD-10-CM

## 2019-01-28 DIAGNOSIS — Z8249 Family history of ischemic heart disease and other diseases of the circulatory system: Secondary | ICD-10-CM | POA: Diagnosis not present

## 2019-01-28 MED ORDER — PREDNISONE 20 MG PO TABS: 20.0000 mg | ORAL_TABLET | Freq: Every day | ORAL | 0 refills | Status: DC

## 2019-01-28 MED ORDER — VALACYCLOVIR HCL 1 G PO TABS: 1000.0000 mg | ORAL_TABLET | Freq: Three times a day (TID) | ORAL | 0 refills | Status: AC

## 2019-01-28 NOTE — Progress Notes (Signed)
Virtual Visit via Video Note  I connected with David Vincent on 01/28/19 at 10:30 AM EDT by a video enabled telemedicine application and verified that I am speaking with the correct person using two identifiers.  Location patient: home Location provider:work or home office Persons participating in the virtual visit: patient, provider  I discussed the limitations of evaluation and management by telemedicine and the availability of in person appointments. The patient expressed understanding and agreed to proceed.   HPI: 68 year old male who is being evaluated today for concern of an allergic reaction from his flu shot.  He received his flu vaccination 6 days ago.  The day after his flu vaccination he started developing a rash under his left breast around to his back.  The rash is described as painful and itching. He has " red bumps that look like blisters".   During his physical he also reported that he had a family history of brain aneurysms were multiple uncles had died.  Since that time he has talked to his sister and reports that it was not brain aneurysm it was aortic aneurysm.  He continues to want to be checked for this   ROS: See pertinent positives and negatives per HPI.  Past Medical History:  Diagnosis Date  . BACK PAIN 03/10/2007  . CAD (coronary artery disease)    A.  01/05/2002 Cath - nonobs dzs (LAD 20prox, RI 30-40 prox)  . ED (erectile dysfunction)   . GERD (gastroesophageal reflux disease)   . History of mumps orchitis   . HYPERTENSION 11/15/2006  . INGUINAL HERNIA, RIGHT 12/05/2009  . LEG CRAMPS 12/03/2008  . SLEEP APNEA 11/16/2008  . Unstable angina ([facility])   . VERTIGO, POSITIONAL 08/08/2009    Past Surgical History:  Procedure Laterality Date  . CARDIAC CATHETERIZATION    . FOOT SURGERY Right 2019  . KNEE ARTHROSCOPY     Right    Family History  Problem Relation Age of Onset  . Pneumonia Mother        died 76 - hip fx/pna  . Heart attack Father        died 16   . Hypertension Sister        alive  . Cancer Brother        died - agent orange exposure  . Hypertension Brother        alive      Current Outpatient Medications:  .  atorvastatin (LIPITOR) 20 MG tablet, Take 1 tablet by mouth once daily, Disp: 90 tablet, Rfl: 0 .  EPINEPHrine 0.3 mg/0.3 mL IJ SOAJ injection, , Disp: , Rfl:  .  losartan (COZAAR) 100 MG tablet, Take 1 tablet by mouth once daily, Disp: 90 tablet, Rfl: 0 .  meclizine (ANTIVERT) 25 MG tablet, Take 1 tablet (25 mg total) by mouth 3 (three) times daily as needed for dizziness., Disp: 30 tablet, Rfl: 1 .  omeprazole (PRILOSEC) 20 MG capsule, Take 1 capsule (20 mg total) by mouth every morning., Disp: 90 capsule, Rfl: 3  EXAM:  VITALS per patient if applicable:  GENERAL: alert, oriented, appears well and in no acute distress  HEENT: atraumatic, conjunttiva clear, no obvious abnormalities on inspection of external nose and ears  NECK: normal movements of the head and neck  LUNGS: on inspection no signs of respiratory distress, breathing rate appears normal, no obvious gross SOB, gasping or wheezing  CV: no obvious cyanosis  MS: moves all visible extremities without noticeable abnormality  PSYCH/NEURO: pleasant and cooperative,  no obvious depression or anxiety, speech and thought processing grossly intact  Skin: Red vascular rash under left breast that radiates to left upper back. Rash does not present past midline.   ASSESSMENT AND PLAN:  Discussed the following assessment and plan:  1. Disseminated herpes zoster - Advised to stay away from people who have not had chicken pox as well as anyone who is pregnant.  - Can take Motrin for pain relief  - Watch for signs of infection and follow up as needed  - valACYclovir (VALTREX) 1000 MG tablet; Take 1 tablet (1,000 mg total) by mouth 3 (three) times daily for 7 days.  Dispense: 21 tablet; Refill: 0 - predniSONE (DELTASONE) 20 MG tablet; Take 1 tablet (20 mg total)  by mouth daily with breakfast.  Dispense: 7 tablet; Refill: 0  2. Family history of abdominal aortic aneurysm (AAA)  - VAS [place] AAA DUPLEX; Future    I discussed the assessment and treatment plan with the patient. The patient was provided an opportunity to ask questions and all were answered. The patient agreed with the plan and demonstrated an understanding of the instructions.   The patient was advised to call back or seek an in-person evaluation if the symptoms worsen or if the condition fails to improve as anticipated.   [name], NP

## 2019-01-28 NOTE — Telephone Encounter (Signed)
Patient is having a CT Scan done of his brain the beginning of Nov for family history of aneurysms of the brain.  His sister has informed him that the family history is aneurysms of the stomach.  He is wanted to know what he needs to do?adkins

## 2019-02-02 ENCOUNTER — Ambulatory Visit: Payer: Self-pay | Admitting: *Deleted

## 2019-02-02 NOTE — Telephone Encounter (Signed)
Summary: Shingles   Pt stated he was diagnosed with shingles and would like to know how long it normally lasts and how will he know it's gone. Please advise.      Patient is presently under treatment for shingles. Patient states his pain is waking him at night. He has itching on his back- but can not reach to scratch it. Patient would like advisement on how to best treat the pain- he is using Tylenol without much relief. Discussed action of the virus and normal course- 2-6 weeks depending on severity. Advised not to work until blisters are healing and scabbed over. Will send not to see what PCP advised for itch and pain relief.  Reason for Disposition . [1] Shingles rash already diagnosed and [2] taking antiviral medication  Answer Assessment - Initial Assessment Questions 1. APPEARANCE of RASH: "Describe the rash."      Red spots- on front of chest it looks like a blister- and the back is very painful 2. LOCATION: "Where is the rash located?"      Chest to back 3. ONSET: "When did the rash start?"      Symptoms started a couple weeks ago 4. ITCHING: "Does the rash itch?" If so, ask: "How bad is the itch?"  (Scale 1-10; or mild, moderate, severe)     Itches- mild/moderate 5. PAIN: "Does the rash hurt?" If so, ask: "How bad is the pain?"  (Scale 1-10; or mild, moderate, severe)     Pain- 7 6. OTHER SYMPTOMS: "Do you have any other symptoms?" (e.g., fever)     no 7. PREGNANCY: "Is there any chance you are pregnant?" "When was your last menstrual period?"     n/a  Protocols used: [facility]

## 2019-02-03 ENCOUNTER — Telehealth: Payer: Self-pay | Admitting: Adult Health

## 2019-02-03 ENCOUNTER — Other Ambulatory Visit: Payer: Self-pay | Admitting: Adult Health

## 2019-02-03 MED ORDER — GABAPENTIN 300 MG PO CAPS: 300.0000 mg | ORAL_CAPSULE | Freq: Two times a day (BID) | ORAL | 0 refills | Status: DC

## 2019-02-03 NOTE — Telephone Encounter (Signed)
Spoke to the pt and advised that medication has been sent to the pharmacy.  Nothing further needed.

## 2019-02-03 NOTE — Telephone Encounter (Signed)
Patient spoke with Triage Nurse yesterday about shingles and wanted to know what he could do for the pain .patient is having back pain     Please advise

## 2019-02-03 NOTE — Progress Notes (Signed)
Encounter for gabapentin for Postherpetic neuralgia

## 2019-02-03 NOTE — Telephone Encounter (Signed)
I have sent in Gabapentin 300 mg. Take twice a day. Try at night first as this medication can cause some people to be drowsy

## 2019-02-03 NOTE — Telephone Encounter (Signed)
Duplicate message.  Medication has been sent in.

## 2019-02-04 ENCOUNTER — Other Ambulatory Visit (HOSPITAL_COMMUNITY): Payer: PPO

## 2019-02-09 ENCOUNTER — Encounter (HOSPITAL_COMMUNITY): Payer: Self-pay

## 2019-02-09 ENCOUNTER — Emergency Department (HOSPITAL_COMMUNITY): Payer: PPO

## 2019-02-09 ENCOUNTER — Other Ambulatory Visit: Payer: Self-pay

## 2019-02-09 ENCOUNTER — Emergency Department (HOSPITAL_COMMUNITY)
Admission: EM | Admit: 2019-02-09 | Discharge: 2019-02-10 | Disposition: A | Discharge status: Home / Self Care | Payer: PPO | Attending: Emergency Medicine | Admitting: Emergency Medicine

## 2019-02-09 DIAGNOSIS — R0789 Other chest pain: Secondary | ICD-10-CM | POA: Diagnosis not present

## 2019-02-09 DIAGNOSIS — I259 Chronic ischemic heart disease, unspecified: Secondary | ICD-10-CM | POA: Diagnosis not present

## 2019-02-09 DIAGNOSIS — Z79899 Other long term (current) drug therapy: Secondary | ICD-10-CM | POA: Diagnosis not present

## 2019-02-09 DIAGNOSIS — I10 Essential (primary) hypertension: Secondary | ICD-10-CM | POA: Insufficient documentation

## 2019-02-09 DIAGNOSIS — R079 Chest pain, unspecified: Secondary | ICD-10-CM | POA: Diagnosis not present

## 2019-02-09 LAB — CBC
HCT: 43.6 % (ref 39.0–52.0)
Hemoglobin: 14.7 g/dL (ref 13.0–17.0)
MCH: 32.6 pg (ref 26.0–34.0)
MCHC: 33.7 g/dL (ref 30.0–36.0)
MCV: 96.7 fL (ref 80.0–100.0)
Platelets: 262 10*3/uL (ref 150–400)
RBC: 4.51 MIL/uL (ref 4.22–5.81)
RDW: 14.4 % (ref 11.5–15.5)
WBC: 8.9 10*3/uL (ref 4.0–10.5)
nRBC: 0 % (ref 0.0–0.2)

## 2019-02-09 LAB — BASIC METABOLIC PANEL
Anion gap: 10 (ref 5–15)
BUN: 17 mg/dL (ref 8–23)
CO2: 27 mmol/L (ref 22–32)
Calcium: 9.3 mg/dL (ref 8.9–10.3)
Chloride: 103 mmol/L (ref 98–111)
Creatinine, Ser: 1.21 mg/dL (ref 0.61–1.24)
GFR calc Af Amer: >60 mL/min (ref >60)
GFR calc non Af Amer: >60 mL/min (ref >60)
Glucose, Bld: 83 mg/dL (ref 70–99)
Potassium: 4 mmol/L (ref 3.5–5.1)
Sodium: 140 mmol/L (ref 135–145)

## 2019-02-09 LAB — TROPONIN I (HIGH SENSITIVITY): Troponin I (High Sensitivity): 4 ng/L (ref <18)

## 2019-02-09 MED ORDER — SODIUM CHLORIDE 0.9% FLUSH: 3.0000 mL | Freq: Once | INTRAVENOUS | Status: DC

## 2019-02-09 NOTE — ED Triage Notes (Signed)
Pt presents with Right side chest pain radiating through to his back and indigestion x1 week. Pt dx with shingles 01/22/2019 and felt the pain was coming from that. Presents today due to no relief

## 2019-02-10 ENCOUNTER — Telehealth: Payer: Self-pay | Admitting: Adult Health

## 2019-02-10 ENCOUNTER — Other Ambulatory Visit: Payer: PPO

## 2019-02-10 LAB — HEPATIC FUNCTION PANEL
ALT: 20 U/L (ref 0–44)
AST: 22 U/L (ref 15–41)
Albumin: 4 g/dL (ref 3.5–5.0)
Alkaline Phosphatase: 82 U/L (ref 38–126)
Bilirubin, Direct: <0.1 mg/dL (ref 0.0–0.2)
Total Bilirubin: 0.8 mg/dL (ref 0.3–1.2)
Total Protein: 6.9 g/dL (ref 6.5–8.1)

## 2019-02-10 LAB — LIPASE, BLOOD: Lipase: 35 U/L (ref 11–51)

## 2019-02-10 LAB — TROPONIN I (HIGH SENSITIVITY): Troponin I (High Sensitivity): 5 ng/L (ref <18)

## 2019-02-10 MED ORDER — HYDROCODONE-ACETAMINOPHEN 5-325 MG PO TABS: 1.0000 | ORAL_TABLET | Freq: Four times a day (QID) | ORAL | 0 refills | Status: DC | PRN

## 2019-02-10 NOTE — Discharge Instructions (Addendum)
You were seen today for chest pain.  Your heart testing today is reassuring.  Continue your omeprazole for GERD.  You will be given a short course of Norco for any possible lingering shingles pain.  Follow-up with cardiology for stress testing and further work-up.

## 2019-02-10 NOTE — ED Provider Notes (Signed)
[facility] EMERGENCY DEPARTMENT Provider Note   CSN: [id number] Arrival date & time: 02/09/19  2147     History   Chief Complaint Chief Complaint  Patient presents with  . Chest Pain    HPI David Vincent is a 68 y.o. male.     HPI  This is a 68 year old male with a history of reflux, hypertension, recent history of shingles who presents with chest pain.  Patient reports that he was diagnosed with shingles and has been dealing with some left-sided chest pain over the last several weeks.  However today he began having centralized chest pain that radiated to the right side.  This was different than the pain he had been experiencing with his shingles.  Denies any shortness of breath or fevers.  He reports that he was watching TV at that time.  Pain started around 9 PM.  He reports that it comes and goes and lasts for seconds at a time.  Currently he is pain free.  He did not identify any exacerbating or alleviating factors.  Denies any leg swelling or history of blood clots.  Of note, patient was treated with a week of valacyclovir and prednisone for shingles.  He was also started on gabapentin but states that he does not tolerate this medication because of side effects.  Is not otherwise taking any pain medication.  Past Medical History:  Diagnosis Date  . BACK PAIN 03/10/2007  . CAD (coronary artery disease)    A.  01/05/2002 Cath - nonobs dzs (LAD 20prox, RI 30-40 prox)  . ED (erectile dysfunction)   . GERD (gastroesophageal reflux disease)   . History of mumps orchitis   . HYPERTENSION 11/15/2006  . INGUINAL HERNIA, RIGHT 12/05/2009  . LEG CRAMPS 12/03/2008  . SLEEP APNEA 11/16/2008  . Unstable angina ([place])   . VERTIGO, POSITIONAL 08/08/2009    Patient Active Problem List   Diagnosis Date Noted  . Low testosterone 08/24/2014  . Erectile dysfunction due to diseases classified elsewhere 08/24/2014  . Multiple fractures of ribs of left side 01/01/2012  . GERD  (gastroesophageal reflux disease) 05/03/2011  . CAD (coronary artery disease)   . Unstable angina ([facility])   . INGUINAL HERNIA, RIGHT 12/05/2009  . VERTIGO, POSITIONAL 08/08/2009  . Sleep apnea 11/16/2008  . Essential hypertension 11/15/2006    Past Surgical History:  Procedure Laterality Date  . CARDIAC CATHETERIZATION    . FOOT SURGERY Right 2019  . KNEE ARTHROSCOPY     Right        Home Medications    Prior to Admission medications   Medication Sig Start Date End Date Taking? Authorizing Provider  atorvastatin (LIPITOR) 20 MG tablet Take 1 tablet by mouth once daily Patient taking differently: Take 20 mg by mouth daily at 6 PM.  11/27/18  Yes Nafziger, Tommi Rumps, NP  EPINEPHrine 0.3 mg/0.3 mL IJ SOAJ injection Inject 0.3 mg into the muscle as needed for anaphylaxis.  10/30/17  Yes [provider]  gabapentin (NEURONTIN) 300 MG capsule Take 1 capsule (300 mg total) by mouth 2 (two) times daily. Patient taking differently: Take 300 mg by mouth at bedtime.  02/03/19 03/05/19 Yes Nafziger, Tommi Rumps, NP  losartan (COZAAR) 100 MG tablet Take 1 tablet by mouth once daily Patient taking differently: Take 100 mg by mouth daily.  01/13/19  Yes Nafziger, Tommi Rumps, NP  meclizine (ANTIVERT) 25 MG tablet Take 1 tablet (25 mg total) by mouth 3 (three) times daily as needed for  dizziness. 12/31/17  Yes Nafziger, Kandee Keen, NP  omeprazole (PRILOSEC) 20 MG capsule Take 1 capsule (20 mg total) by mouth every morning. Patient taking differently: Take 20 mg by mouth daily.  01/22/19  Yes Nafziger, Kandee Keen, NP  HYDROcodone-acetaminophen (NORCO/VICODIN) 5-325 MG tablet Take 1 tablet by mouth every 6 (six) hours as needed. 02/10/19   , Mayer Masker, MD  predniSONE (DELTASONE) 20 MG tablet Take 1 tablet (20 mg total) by mouth daily with breakfast. Patient not taking: Reported on 02/10/2019 01/28/19   [name], NP    Family History Family History  Problem Relation Age of Onset  . Pneumonia Mother         died 85 - hip fx/pna  . Heart attack Father        died 53  . Hypertension Sister        alive  . Cancer Brother        died - agent orange exposure  . Hypertension Brother        alive    Social History Social History   Tobacco Use  . Smoking status: Never Smoker  . Smokeless tobacco: Never Used  Substance Use Topics  . Alcohol use: No  . Drug use: No     Allergies   Bee venom, Codeine phosphate, and Penicillins   Review of Systems Review of Systems  Constitutional: Negative for fever.  Respiratory: Negative for shortness of breath.   Cardiovascular: Positive for chest pain. Negative for leg swelling.  Gastrointestinal: Negative for abdominal pain, nausea and vomiting.  Genitourinary: Negative for dysuria.  Skin: Positive for rash.  All other systems reviewed and are negative.    Physical Exam Updated Vital Signs BP 134/70   Pulse 63   Temp 98.4 F (36.9 C) (Oral)   Resp 17   SpO2 98%   Physical Exam Vitals signs and nursing note reviewed.  Constitutional:      Appearance: He is well-developed. He is not ill-appearing.  HENT:     Head: Normocephalic and atraumatic.  Eyes:     Pupils: Pupils are equal, round, and reactive to light.  Neck:     Musculoskeletal: Neck supple.  Cardiovascular:     Rate and Rhythm: Normal rate and regular rhythm.     Heart sounds: Normal heart sounds. No murmur.  Pulmonary:     Effort: Pulmonary effort is normal. No respiratory distress.     Breath sounds: Normal breath sounds. No wheezing.  Abdominal:     General: Bowel sounds are normal.     Palpations: Abdomen is soft.     Tenderness: There is no abdominal tenderness. There is no rebound.  Musculoskeletal:     Right lower leg: He exhibits no tenderness. No edema.     Left lower leg: He exhibits no tenderness. No edema.  Skin:    General: Skin is warm and dry.  Neurological:     Mental Status: He is alert and oriented to person, place, and time.  Psychiatric:         Mood and Affect: Mood normal.      ED Treatments / Results  Labs (all labs ordered are listed, but only abnormal results are displayed) Labs Reviewed  BASIC METABOLIC PANEL  CBC  HEPATIC FUNCTION PANEL  LIPASE, BLOOD  TROPONIN I (HIGH SENSITIVITY)  TROPONIN I (HIGH SENSITIVITY)    EKG EKG Interpretation  Date/Time:  Monday February 09 2019 21:57:08 EST Ventricular Rate:  72 PR Interval:  130 QRS Duration:  84 QT Interval:  388 QTC Calculation: 424 R Axis:   68 Text Interpretation: Normal sinus rhythm Cannot rule out Anterior infarct , age undetermined Abnormal ECG Confirmed by Ross MarcusHorton,  [phone]([id number]) on 02/10/2019 12:33:42 AM   Radiology Dg Chest 2 View  Result Date: 02/09/2019 CLINICAL DATA:  Chest pain EXAM: CHEST - 2 VIEW COMPARISON:  04/03/2018 FINDINGS: Low lung volumes. Heart and mediastinal contours are within normal limits. No focal opacities or effusions. No acute bony abnormality. IMPRESSION: Low volumes.  No active cardiopulmonary disease. Electronically Signed   By: Charlett NoseKevin  Dover M.D.   On: 02/09/2019 22:35    Procedures Procedures (including critical care time)  Medications Ordered in ED Medications  sodium chloride flush (NS) 0.9 % injection 3 mL (has no administration in time range)     Initial Impression / Assessment and Plan / ED Course  I have reviewed the triage vital signs and the nursing notes.  Pertinent labs & imaging results that were available during my care of the patient were reviewed by me and considered in my medical decision making (see chart for details).        Patient presents with chest pain.  Overall nontoxic-appearing vital signs are reassuring.  EKG without ischemic changes.  History is very atypical.  Recent shingles and also history of reflux.  Pain is very short-lived and currently he is pain-free.  Troponin x2 is negative which is reassuring.  Patient previously had a stress test in 2013 which was normal.  He has not had  any further cardiac evaluation since that time.  He also has a documented cath from 2003 which is nonobstructive.  His risk factors include hypertension and age.  Feel is reasonable for him to follow-up as an outpatient for definitive functional testing.  On recheck, he remains pain-free.  Pain could be because of residual shingles versus reflux.  Recommend continuing omeprazole.  He was given a short course of Norco as he is not tolerating gabapentin to see if this helps.  After history, exam, and medical workup I feel the patient has been appropriately medically screened and is safe for discharge home. Pertinent diagnoses were discussed with the patient. Patient was given return precautions.   Final Clinical Impressions(s) / ED Diagnoses   Final diagnoses:  Atypical chest pain    ED Discharge Orders         Ordered    HYDROcodone-acetaminophen (NORCO/VICODIN) 5-325 MG tablet  Every 6 hours PRN     02/10/19 0238           Shon BatonHorton,  F, MD 02/10/19 [phone]

## 2019-02-10 NOTE — Telephone Encounter (Signed)
He can come in to the office.

## 2019-02-10 NOTE — Telephone Encounter (Signed)
Pt has been scheduled.  °

## 2019-02-10 NOTE — Telephone Encounter (Signed)
The patient went to the ED for chest pain last night and needs to follow up with his PCP.   Can I schedule him to come in office or is it okay to schedule him for a virtual visit?  Please Advise

## 2019-02-11 ENCOUNTER — Ambulatory Visit (INDEPENDENT_AMBULATORY_CARE_PROVIDER_SITE_OTHER): Payer: PPO | Admitting: Adult Health

## 2019-02-11 ENCOUNTER — Encounter: Payer: Self-pay | Admitting: Adult Health

## 2019-02-11 ENCOUNTER — Other Ambulatory Visit: Payer: Self-pay

## 2019-02-11 VITALS — BP 124/82 | HR 68 | Temp 98.2°F | Wt 263.4 lb

## 2019-02-11 DIAGNOSIS — B027 Disseminated zoster: Secondary | ICD-10-CM

## 2019-02-11 DIAGNOSIS — R0789 Other chest pain: Secondary | ICD-10-CM

## 2019-02-11 NOTE — Progress Notes (Signed)
   Subjective:    Patient ID: David Vincent, male    DOB: April 01, 1951, 68 y.o.   MRN: [id number]  HPI    Review of Systems     Objective:   Physical Exam        Assessment & Plan:

## 2019-02-11 NOTE — Progress Notes (Signed)
Subjective:    Patient ID: David Vincent, male    DOB: March 18, 1951, 68 y.o.   MRN: [id number]  HPI 68 year old male who  has a past medical history of BACK PAIN (03/10/2007), CAD (coronary artery disease), ED (erectile dysfunction), GERD (gastroesophageal reflux disease), History of mumps orchitis, HYPERTENSION (11/15/2006), INGUINAL HERNIA, RIGHT (12/05/2009), LEG CRAMPS (12/03/2008), SLEEP APNEA (11/16/2008), Unstable angina ([facility]), and VERTIGO, POSITIONAL (08/08/2009).  He was seen in the emergency room on 02/09/2019 with a complaint of centralized chest pain that radiated to the right side.  He felt that this was a different pain that he had been experiencing with his recent shingles outbreak.  At this time he denies shortness of breath or fevers.  He was watching TV at the time of the pain.  Pain would come and go and only last a few seconds at a time.  He denied any exacerbating or relieving factors.  In the ER his EKG did not show any ischemic changes.  Troponin x2 was negative.  Chest x-ray showed no active cardiopulmonary disease.  Thought that his pain was likely from shingles or GERD.  He is taking Prilosec.  Today he reports that he has only had one episode of right-sided chest pain since he was discharged from the emergency room 2 days ago.  Reports that his shingle rash has pretty much cleared up but he continues to have few scabbed over blisters on his chest and on his back.  He does get pain from these areas intermittently as well as severe itching.  Review of Systems See HPI   Past Medical History:  Diagnosis Date  . BACK PAIN 03/10/2007  . CAD (coronary artery disease)    A.  01/05/2002 Cath - nonobs dzs (LAD 20prox, RI 30-40 prox)  . ED (erectile dysfunction)   . GERD (gastroesophageal reflux disease)   . History of mumps orchitis   . HYPERTENSION 11/15/2006  . INGUINAL HERNIA, RIGHT 12/05/2009  . LEG CRAMPS 12/03/2008  . SLEEP APNEA 11/16/2008  . Unstable angina ([facility])   .  VERTIGO, POSITIONAL 08/08/2009    Social History   Socioeconomic History  . Marital status: Married    Spouse name: Not on file  . Number of children: Not on file  . Years of education: Not on file  . Highest education level: Not on file  Occupational History  . Not on file  Social Needs  . Financial resource strain: Not on file  . Food insecurity    Worry: Not on file    Inability: Not on file  . Transportation needs    Medical: Not on file    Non-medical: Not on file  Tobacco Use  . Smoking status: Never Smoker  . Smokeless tobacco: Never Used  Substance and Sexual Activity  . Alcohol use: No  . Drug use: No  . Sexual activity: Not on file  Lifestyle  . Physical activity    Days per week: Not on file    Minutes per session: Not on file  . Stress: Not on file  Relationships  . Social Musician on phone: Not on file    Gets together: Not on file    Attends religious service: Not on file    Active member of club or organization: Not on file    Attends meetings of clubs or organizations: Not on file    Relationship status: Not on file  . Intimate partner violence  Fear of current or ex partner: Not on file    Emotionally abused: Not on file    Physically abused: Not on file    Forced sexual activity: Not on file  Other Topics Concern  . Not on file  Social History Narrative   05/02/2011:  Currently unemployed.  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  Does not exercise or adhere to any specific diet.    Past Surgical History:  Procedure Laterality Date  . CARDIAC CATHETERIZATION    . FOOT SURGERY Right 2019  . KNEE ARTHROSCOPY     Right    Family History  Problem Relation Age of Onset  . Pneumonia Mother        died 1088 - hip fx/pna  . Heart attack Father        died 6790  . Hypertension Sister        alive  . Cancer Brother        died - agent orange exposure  . Hypertension Brother        alive    Allergies  Allergen  Reactions  . Bee Venom Anaphylaxis  . Gabapentin Other (See Comments)    Hallucinations   . Codeine Phosphate Itching  . Penicillins Other (See Comments)    Has patient had a PCN reaction causing immediate rash, facial/tongue/throat swelling, SOB or lightheadedness with hypotension: Unknown, childhood reaction Has patient had a PCN reaction causing severe rash involving mucus membranes or skin necrosis: No Has patient had a PCN reaction that required hospitalization No Has patient had a PCN reaction occurring within the last 10 years: No If all of the above answers are "NO", then may proceed with Cephalosporin use.     Current Outpatient Medications on File Prior to Visit  Medication Sig Dispense Refill  . atorvastatin (LIPITOR) 20 MG tablet Take 1 tablet by mouth once daily (Patient taking differently: Take 20 mg by mouth daily at 6 PM. ) 90 tablet 0  . EPINEPHrine 0.3 mg/0.3 mL IJ SOAJ injection Inject 0.3 mg into the muscle as needed for anaphylaxis.     Marland Kitchen. gabapentin (NEURONTIN) 300 MG capsule Take 1 capsule (300 mg total) by mouth 2 (two) times daily. (Patient taking differently: Take 300 mg by mouth at bedtime. ) 60 capsule 0  . HYDROcodone-acetaminophen (NORCO/VICODIN) 5-325 MG tablet Take 1 tablet by mouth every 6 (six) hours as needed. 10 tablet 0  . losartan (COZAAR) 100 MG tablet Take 1 tablet by mouth once daily (Patient taking differently: Take 100 mg by mouth daily. ) 90 tablet 0  . meclizine (ANTIVERT) 25 MG tablet Take 1 tablet (25 mg total) by mouth 3 (three) times daily as needed for dizziness. 30 tablet 1  . omeprazole (PRILOSEC) 20 MG capsule Take 1 capsule (20 mg total) by mouth every morning. (Patient taking differently: Take 20 mg by mouth daily. ) 90 capsule 3  . predniSONE (DELTASONE) 20 MG tablet Take 1 tablet (20 mg total) by mouth daily with breakfast. (Patient not taking: Reported on 02/10/2019) 7 tablet 0   No current facility-administered medications on file  prior to visit.     BP 124/82 (BP Location: Left Arm, Patient Position: Sitting, Cuff Size: Large)   Pulse 68   Temp 98.2 F (36.8 C) (Temporal)   Wt 263 lb 6.4 oz (119.5 kg)   SpO2 98%   BMI 37.79 kg/m       Objective:   Physical  Exam Vitals signs and nursing note reviewed.  Constitutional:      Appearance: He is obese.  Cardiovascular:     Pulses: Normal pulses.     Heart sounds: Normal heart sounds.  Pulmonary:     Effort: Pulmonary effort is normal.     Breath sounds: Normal breath sounds.  Abdominal:     General: Abdomen is flat. Bowel sounds are normal. There is no distension.     Palpations: Abdomen is soft. There is no mass.     Tenderness: There is no abdominal tenderness.  Musculoskeletal: Normal range of motion.  Skin:    General: Skin is warm and dry.     Capillary Refill: Capillary refill takes less than 2 seconds.     Findings: Rash present.     Comments: Is a small group of 4 scabbed over blisters right mid back as well as a small group of 2 scabbed over blisters on right breast.  No active infection noted  Neurological:     General: No focal deficit present.     Mental Status: He is alert and oriented to person, place, and time.  Psychiatric:        Mood and Affect: Mood normal.        Behavior: Behavior normal.        Thought Content: Thought content normal.        Judgment: Judgment normal.       Assessment & Plan:   1. Atypical chest pain -Likely due to referred pain from shingles outbreak.  Encouraged to follow-up as needed.  Pain should resolve completely in the next few weeks  2. Disseminated herpes zoster -Can apply over-the-counter Benadryl cream to help with itching.  Continue to monitor for signs of infection.  I would expect shingles rash to resolve within the next few weeks.  Follow-up as needed  Dorothyann Peng, NP

## 2019-02-25 ENCOUNTER — Ambulatory Visit (HOSPITAL_COMMUNITY)
Admission: RE | Admit: 2019-02-25 | Discharge: 2019-02-25 | Disposition: A | Discharge status: Home / Self Care | Payer: PPO | Source: Ambulatory Visit | Attending: Cardiology | Admitting: Cardiology

## 2019-02-25 ENCOUNTER — Other Ambulatory Visit: Payer: Self-pay

## 2019-02-25 DIAGNOSIS — I10 Essential (primary) hypertension: Secondary | ICD-10-CM | POA: Insufficient documentation

## 2019-02-25 DIAGNOSIS — Z136 Encounter for screening for cardiovascular disorders: Secondary | ICD-10-CM | POA: Diagnosis not present

## 2019-02-25 DIAGNOSIS — Z8489 Family history of other specified conditions: Secondary | ICD-10-CM | POA: Diagnosis not present

## 2019-02-25 DIAGNOSIS — Z8249 Family history of ischemic heart disease and other diseases of the circulatory system: Secondary | ICD-10-CM | POA: Diagnosis not present

## 2019-02-28 ENCOUNTER — Other Ambulatory Visit: Payer: Self-pay | Admitting: Adult Health

## 2019-03-02 NOTE — Telephone Encounter (Signed)
SENT TO THE PHARMACY BY E-SCRIBE. 

## 2019-03-04 ENCOUNTER — Other Ambulatory Visit: Payer: Self-pay

## 2019-04-11 ENCOUNTER — Other Ambulatory Visit: Payer: Self-pay | Admitting: Adult Health

## 2019-05-13 ENCOUNTER — Ambulatory Visit (HOSPITAL_COMMUNITY)
Admission: EM | Admit: 2019-05-13 | Discharge: 2019-05-13 | Disposition: A | Discharge status: Home / Self Care | Payer: PPO | Attending: Family Medicine | Admitting: Family Medicine

## 2019-05-13 ENCOUNTER — Other Ambulatory Visit: Payer: Self-pay

## 2019-05-13 ENCOUNTER — Ambulatory Visit (INDEPENDENT_AMBULATORY_CARE_PROVIDER_SITE_OTHER): Payer: PPO

## 2019-05-13 ENCOUNTER — Encounter (HOSPITAL_COMMUNITY): Payer: Self-pay | Admitting: Emergency Medicine

## 2019-05-13 DIAGNOSIS — S6991XA Unspecified injury of right wrist, hand and finger(s), initial encounter: Secondary | ICD-10-CM | POA: Diagnosis not present

## 2019-05-13 DIAGNOSIS — M79641 Pain in right hand: Secondary | ICD-10-CM

## 2019-05-13 DIAGNOSIS — I10 Essential (primary) hypertension: Secondary | ICD-10-CM

## 2019-05-13 NOTE — Discharge Instructions (Signed)
Your blood pressure was noted to be elevated during your visit today. You may return here within the next few days to recheck if unable to see your primary care doctor.  BP (!) 165/93 (BP Location: Left Arm)   Pulse 69   Temp 97.8 F (36.6 C) (Oral)   Resp 20   SpO2 98%

## 2019-05-13 NOTE — ED Triage Notes (Signed)
Yesterday, right hand was pulled into a sander. Base of right little finger has a superficial sore, redness around this sore.  Able to bend fingers.  On edge of palm, abrasion noted.  On back of hand on edge with little finger, has swelling and bruising.

## 2019-05-13 NOTE — ED Notes (Signed)
Bed: UC02 Expected date: 05/13/19 Expected time:  Means of arrival:  Comments: App [place] Guitar

## 2019-05-14 NOTE — ED Provider Notes (Signed)
[facility]   [id number] 05/13/19 Arrival Time: 1522  ASSESSMENT & PLAN:  1. Injury of right hand, initial encounter   2. Elevated blood pressure reading in office with diagnosis of hypertension     I have personally viewed the imaging studies ordered this visit. No fractures appreciated.  Prefers OTC analgesics. Activities as tolerated.  Orders Placed This Encounter  Procedures  . DG Hand Complete Right    Recommend: Follow-up Information    [facility].   Specialty: Urgent Care Why: If worsening or failing to improve as anticipated. Contact information: [address] [place] [address] [phone]            Discharge Instructions     Your blood pressure was noted to be elevated during your visit today. You may return here within the next few days to recheck if unable to see your primary care doctor.  BP (!) 165/93 (BP Location: Left Arm)   Pulse 69   Temp 97.8 F (36.6 C) (Oral)   Resp 20   SpO2 98%       Reviewed expectations re: course of current medical issues. Questions answered. Outlined signs and symptoms indicating need for more acute intervention. Patient verbalized understanding. After Visit Summary given.  SUBJECTIVE: History from: patient. David Vincent is a 69 y.o. male who reports fairly persistent mild pain of his right hand. Reports his hand was pulled into a belt sander. Quick shut off; able to pull hand free without complication. No open wounds. No extremity sensation changes or weakness. Reports being able to move and bend fingers normally. Mild swelling round fifth finger. No home treatment.  Increased blood pressure noted today. Reports that he is treated for HTN.  He reports taking medications as instructed, no chest pain on exertion, no dyspnea on exertion, no swelling of ankles, no orthostatic dizziness or lightheadedness, no orthopnea or paroxysmal nocturnal  dyspnea, no palpitations and no intermittent claudication symptoms.   Past Surgical History:  Procedure Laterality Date  . CARDIAC CATHETERIZATION    . FOOT SURGERY Right 2019  . KNEE ARTHROSCOPY     Right      OBJECTIVE:  Vitals:   05/13/19 1610  BP: (!) 165/93  Pulse: 69  Resp: 20  Temp: 97.8 F (36.6 C)  TempSrc: Oral  SpO2: 98%    General appearance: alert; no distress HEENT: [place]; AT Neck: supple with FROM Resp: unlabored respirations Extremities: . RUE: warm with well perfused appearance; poorly localized mild tenderness over right ulnar hand with skin abrasions; without gross deformities; swelling: minimal; bruising: none; wrist and finger with FROM CV: brisk extremity capillary refill of RUE; 2+ radial pulse of RUE. Skin: warm and dry; no visible rashes Neurologic: gait normal; normal sensation and strength of RUE Psychological: alert and cooperative; normal mood and affect  Imaging: DG Hand Complete Right  Result Date: 05/13/2019 CLINICAL DATA:  Right hand pain since an injury using a sander yesterday. Initial encounter. EXAM: RIGHT HAND - COMPLETE 3+ VIEW COMPARISON:  None. FINDINGS: There is no evidence of fracture or dislocation. There is no evidence of arthropathy or other focal bone abnormality. Soft tissues are unremarkable. IMPRESSION: Normal exam. Electronically Signed   By: Drusilla Kanner M.D.   On: 05/13/2019 16:36      Allergies  Allergen Reactions  . Bee Venom Anaphylaxis  . Gabapentin Other (See Comments)    Hallucinations   . Codeine Phosphate Itching  . Penicillins  Other (See Comments)    Has patient had a PCN reaction causing immediate rash, facial/tongue/throat swelling, SOB or lightheadedness with hypotension: Unknown, childhood reaction Has patient had a PCN reaction causing severe rash involving mucus membranes or skin necrosis: No Has patient had a PCN reaction that required hospitalization No Has patient had a PCN reaction occurring  within the last 10 years: No If all of the above answers are "NO", then may proceed with Cephalosporin use.     Past Medical History:  Diagnosis Date  . BACK PAIN 03/10/2007  . CAD (coronary artery disease)    A.  01/05/2002 Cath - nonobs dzs (LAD 20prox, RI 30-40 prox)  . ED (erectile dysfunction)   . GERD (gastroesophageal reflux disease)   . History of mumps orchitis   . HYPERTENSION 11/15/2006  . INGUINAL HERNIA, RIGHT 12/05/2009  . LEG CRAMPS 12/03/2008  . SLEEP APNEA 11/16/2008  . Unstable angina ([place])   . VERTIGO, POSITIONAL 08/08/2009   Social History   Socioeconomic History  . Marital status: Married    Spouse name: Not on file  . Number of children: Not on file  . Years of education: Not on file  . Highest education level: Not on file  Occupational History  . Not on file  Tobacco Use  . Smoking status: Never Smoker  . Smokeless tobacco: Never Used  Substance and Sexual Activity  . Alcohol use: No  . Drug use: No  . Sexual activity: Not on file  Other Topics Concern  . Not on file  Social History Narrative   05/02/2011:  Currently unemployed.  Prev worked in a Hotel manager.  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  Does not exercise or adhere to any specific diet.   Social Determinants of Health   Financial Resource Strain:   . Difficulty of Paying Living Expenses: Not on file  Food Insecurity:   . Worried About Charity fundraiser in the Last Year: Not on file  . Ran Out of Food in the Last Year: Not on file  Transportation Needs:   . Lack of Transportation (Medical): Not on file  . Lack of Transportation (Non-Medical): Not on file  Physical Activity:   . Days of Exercise per Week: Not on file  . Minutes of Exercise per Session: Not on file  Stress:   . Feeling of Stress : Not on file  Social Connections:   . Frequency of Communication with Friends and Family: Not on file  . Frequency of Social Gatherings with Friends and Family: Not on file  . Attends Religious  Services: Not on file  . Active Member of Clubs or Organizations: Not on file  . Attends Archivist Meetings: Not on file  . Marital Status: Not on file   Family History  Problem Relation Age of Onset  . Pneumonia Mother        died 71 - hip fx/pna  . Heart attack Father        died 22  . Hypertension Sister        alive  . Cancer Brother        died - agent orange exposure  . Hypertension Brother        alive   Past Surgical History:  Procedure Laterality Date  . CARDIAC CATHETERIZATION    . FOOT SURGERY Right 2019  . KNEE ARTHROSCOPY     Right      Vanessa Kick, MD 05/14/19 1002

## 2019-06-19 ENCOUNTER — Ambulatory Visit: Payer: PPO | Attending: Internal Medicine

## 2019-06-19 DIAGNOSIS — Z23 Encounter for immunization: Secondary | ICD-10-CM

## 2019-06-19 NOTE — Progress Notes (Signed)
   Covid-19 Vaccination Clinic  Name:  David Vincent    MRN: [id number] DOB: [birth year]  06/19/2019  [name] was observed post Covid-19 immunization for 15 minutes without incident. He was provided with Vaccine Information Sheet and instruction to access the V-Safe system.   [name] was instructed to call 911 with any severe reactions post vaccine: Marland Kitchen Difficulty breathing  . Swelling of face and throat  . A fast heartbeat  . A bad rash all over body  . Dizziness and weakness   Immunizations Administered    Name Date Dose VIS Date Route   Pfizer COVID-19 Vaccine 06/19/2019  3:50 PM 0.3 mL 03/20/2019 Intramuscular   Manufacturer: [employer], [employer]   Lot: [id number]   NDC: [address]-3

## 2019-07-12 ENCOUNTER — Other Ambulatory Visit: Payer: Self-pay | Admitting: Adult Health

## 2019-07-13 ENCOUNTER — Ambulatory Visit: Payer: PPO | Attending: Internal Medicine

## 2019-07-13 DIAGNOSIS — Z23 Encounter for immunization: Secondary | ICD-10-CM

## 2019-07-13 NOTE — Progress Notes (Signed)
   Covid-19 Vaccination Clinic  Name:  David Vincent    MRN: [id number] DOB: 1950/05/17  07/13/2019  David Vincent was observed post Covid-19 immunization for 30 minutes based on pre-vaccination screening without incident. He was provided with Vaccine Information Sheet and instruction to access the V-Safe system.   David Vincent was instructed to call 911 with any severe reactions post vaccine: Marland Kitchen Difficulty breathing  . Swelling of face and throat  . A fast heartbeat  . A bad rash all over body  . Dizziness and weakness   Immunizations Administered    Name Date Dose VIS Date Route   Pfizer COVID-19 Vaccine 07/13/2019  5:07 PM 0.3 mL 03/20/2019 Intramuscular   Manufacturer: [employer], [employer]   Lot: [id number]   NDC: [address]-9

## 2019-07-14 NOTE — Telephone Encounter (Signed)
Sent to the pharmacy by e-scribe. 

## 2019-08-16 ENCOUNTER — Encounter (HOSPITAL_COMMUNITY): Payer: Self-pay

## 2019-08-16 ENCOUNTER — Ambulatory Visit (HOSPITAL_COMMUNITY)
Admission: EM | Admit: 2019-08-16 | Discharge: 2019-08-16 | Disposition: A | Discharge status: Home / Self Care | Payer: PPO | Attending: Emergency Medicine | Admitting: Emergency Medicine

## 2019-08-16 DIAGNOSIS — T7840XA Allergy, unspecified, initial encounter: Secondary | ICD-10-CM | POA: Diagnosis not present

## 2019-08-16 DIAGNOSIS — T63481A Toxic effect of venom of other arthropod, accidental (unintentional), initial encounter: Secondary | ICD-10-CM

## 2019-08-16 MED ORDER — EPINEPHRINE 0.3 MG/0.3ML IJ SOAJ: 0.3000 mg | Freq: Once | INTRAMUSCULAR | 0 refills | Status: AC

## 2019-08-16 MED ORDER — MUPIROCIN 2 % EX OINT: 1.0000 "application " | TOPICAL_OINTMENT | Freq: Three times a day (TID) | CUTANEOUS | 0 refills | Status: DC

## 2019-08-16 MED ORDER — IBUPROFEN 600 MG PO TABS: 600.0000 mg | ORAL_TABLET | Freq: Four times a day (QID) | ORAL | 0 refills | Status: DC | PRN

## 2019-08-16 NOTE — ED Provider Notes (Signed)
HPI  SUBJECTIVE:  David Vincent is a 69 y.o. male who presents for possible allergic reaction.  States that he was stung on his right hand 30 minutes prior to arrival.  He is not sure what stung him, but is concerned that it may have been a bee.  He has anaphylaxis with bee stings.  He denies tongue or lip swelling, airway swelling, shortness of breath, wheezing, abdominal pain, diarrhea, syncope.  He reports localized swelling over the dorsum of his hand where he was stung.  He reports mild throbbing pain and pruritus at the sting site.  No aggravating or alleviating factors.  He has not tried anything for this.  He came for observation as his EpiPen expired in January 21 and is concerned that he may go into anaphylaxis.  Past medical history also includes coronary artery disease, hypertension.  No history of diabetes.  IOX:[id number], Kandee Keen, NP     Past Medical History:  Diagnosis Date  . BACK PAIN 03/10/2007  . CAD (coronary artery disease)    A.  01/05/2002 Cath - nonobs dzs (LAD 20prox, RI 30-40 prox)  . ED (erectile dysfunction)   . GERD (gastroesophageal reflux disease)   . History of mumps orchitis   . HYPERTENSION 11/15/2006  . INGUINAL HERNIA, RIGHT 12/05/2009  . LEG CRAMPS 12/03/2008  . SLEEP APNEA 11/16/2008  . Unstable angina ([facility])   . VERTIGO, POSITIONAL 08/08/2009    Past Surgical History:  Procedure Laterality Date  . CARDIAC CATHETERIZATION    . FOOT SURGERY Right 2019  . KNEE ARTHROSCOPY     Right    Family History  Problem Relation Age of Onset  . Pneumonia Mother        died 52 - hip fx/pna  . Heart attack Father        died 50  . Hypertension Sister        alive  . Cancer Brother        died - agent orange exposure  . Hypertension Brother        alive    Social History   Tobacco Use  . Smoking status: Never Smoker  . Smokeless tobacco: Never Used  Substance Use Topics  . Alcohol use: No  . Drug use: No    No current facility-administered  medications for this encounter.  Current Outpatient Medications:  .  atorvastatin (LIPITOR) 20 MG tablet, Take 1 tablet by mouth once daily, Disp: 90 tablet, Rfl: 3 .  EPINEPHrine 0.3 mg/0.3 mL IJ SOAJ injection, Inject 0.3 mLs (0.3 mg total) into the muscle once for 1 dose., Disp: 0.3 mL, Rfl: 0 .  gabapentin (NEURONTIN) 300 MG capsule, Take 1 capsule (300 mg total) by mouth 2 (two) times daily. (Patient taking differently: Take 300 mg by mouth at bedtime. ), Disp: 60 capsule, Rfl: 0 .  ibuprofen (ADVIL) 600 MG tablet, Take 1 tablet (600 mg total) by mouth every 6 (six) hours as needed., Disp: 30 tablet, Rfl: 0 .  losartan (COZAAR) 100 MG tablet, Take 1 tablet by mouth once daily, Disp: 90 tablet, Rfl: 1 .  mupirocin ointment (BACTROBAN) 2 %, Apply 1 application topically 3 (three) times daily., Disp: 22 g, Rfl: 0 .  omeprazole (PRILOSEC) 20 MG capsule, Take 1 capsule (20 mg total) by mouth every morning. (Patient taking differently: Take 20 mg by mouth daily. ), Disp: 90 capsule, Rfl: 3  Allergies  Allergen Reactions  . Bee Venom Anaphylaxis  . Gabapentin Other (See Comments)  Hallucinations   . Codeine Phosphate Itching  . Penicillins Other (See Comments)    Has patient had a PCN reaction causing immediate rash, facial/tongue/throat swelling, SOB or lightheadedness with hypotension: Unknown, childhood reaction Has patient had a PCN reaction causing severe rash involving mucus membranes or skin necrosis: No Has patient had a PCN reaction that required hospitalization No Has patient had a PCN reaction occurring within the last 10 years: No If all of the above answers are "NO", then may proceed with Cephalosporin use.      ROS  As noted in HPI.   Physical Exam  BP (!) 158/90   Pulse 77   Temp 97.9 F (36.6 C) (Oral)   Resp 16   Ht 5\' 8"  (1.727 m)   Wt 113.4 kg   SpO2 95%   BMI 38.01 kg/m   Constitutional: Well developed, well nourished, no acute distress Eyes:  EOMI,  conjunctiva normal bilaterally HENT: Normocephalic, atraumatic,mucus membranes moist.  No angioedema of the lips or tongue.  Airway widely patent.  No drooling, stridor. Respiratory: Normal inspiratory effort lungs clear bilaterally Cardiovascular: Normal rate regular rhythm no murmurs GI: nondistended skin: Nontender area of erythema, mild edema dorsum of the right hand where patient was stung. Musculoskeletal: no deformities Neurologic: Alert & oriented x 3, no focal neuro deficits Psychiatric: Speech and behavior appropriate   ED Course   Medications - No data to display  Orders Placed This Encounter  Procedures  . Apply ice to affected area    Standing Status:   Standing    Number of Occurrences:   1    No results found for this or any previous visit (from the past 24 hour(s)). No results found.  ED Clinical Impression  1. Allergic reaction, initial encounter   2. Insect stings, accidental or unintentional, initial encounter      ED Assessment/Plan  1345 initial evaluation.  Will place ice on the hand.  There is no evidence of anaphylaxis.  Do not think that additional measures are needed at this time.  Will reevaluate.  1400 reevaluation.  No shortness of breath, angioedema.  Reports mild headache.  Declined pain medication.  Ice placed on hand  1441.  Reevaluation.  No shortness of breath, angioedema.  Lungs clear.  Hand still slightly swollen, but nontender.  If he was to develop anaphylaxis I feel he would have developed it by now.  Will send home with an EpiPen, Claritin or Zyrtec, ibuprofen for the hand to take for a day or 2, and some Bactroban or bacitracin.  Follow-up with PMD as needed.  discussed with patient to use the EpiPen, take 50 mg of Benadryl and go to the ER for any signs of anaphylaxis.  Discussed MDM, treatment plan, and plan for follow-up with patient. Discussed sn/sx that should prompt return to the ED. patient agrees with plan.   Meds ordered  this encounter  Medications  . EPINEPHrine 0.3 mg/0.3 mL IJ SOAJ injection    Sig: Inject 0.3 mLs (0.3 mg total) into the muscle once for 1 dose.    Dispense:  0.3 mL    Refill:  0  . mupirocin ointment (BACTROBAN) 2 %    Sig: Apply 1 application topically 3 (three) times daily.    Dispense:  22 g    Refill:  0  . ibuprofen (ADVIL) 600 MG tablet    Sig: Take 1 tablet (600 mg total) by mouth every 6 (six) hours as needed.  Dispense:  30 tablet    Refill:  0    *This clinic note was created using Scientist, clinical (histocompatibility and immunogenetics). Therefore, there may be occasional mistakes despite careful proofreading.   ?    Domenick Gong, MD 08/17/19 [phone]

## 2019-08-16 NOTE — Discharge Instructions (Addendum)
Bacitracin or Bactroban to the area where you were stung.  Continue icing it.  You may take 600 mg of ibuprofen and some Claritin or Zyrtec for the next day or so to help with the swelling and itching.  I have refilled her EpiPen.  If you had any signs of anaphylaxis, such as your throat swelling shut, lip swelling tongue swelling wheezing, etc., use the EpiPen immediately, take 50 mg of Benadryl and go to the ER.

## 2019-08-16 NOTE — ED Triage Notes (Signed)
Pt states he was stung by a bee on his right hand and he's allergic to bees. Pt states his epi pen expired so he did not have an epi injection. Pt denies SOB at this time. Pt has non labored breathing. Skin color WNL.

## 2019-08-20 IMAGING — DX DG CHEST 2V
2 series · 2 of 2 positions shown · non-contrast
Comparison: December 31, 2011

CLINICAL DATA: Chest pain

EXAM:
CHEST - 2 VIEW

[chest pa]
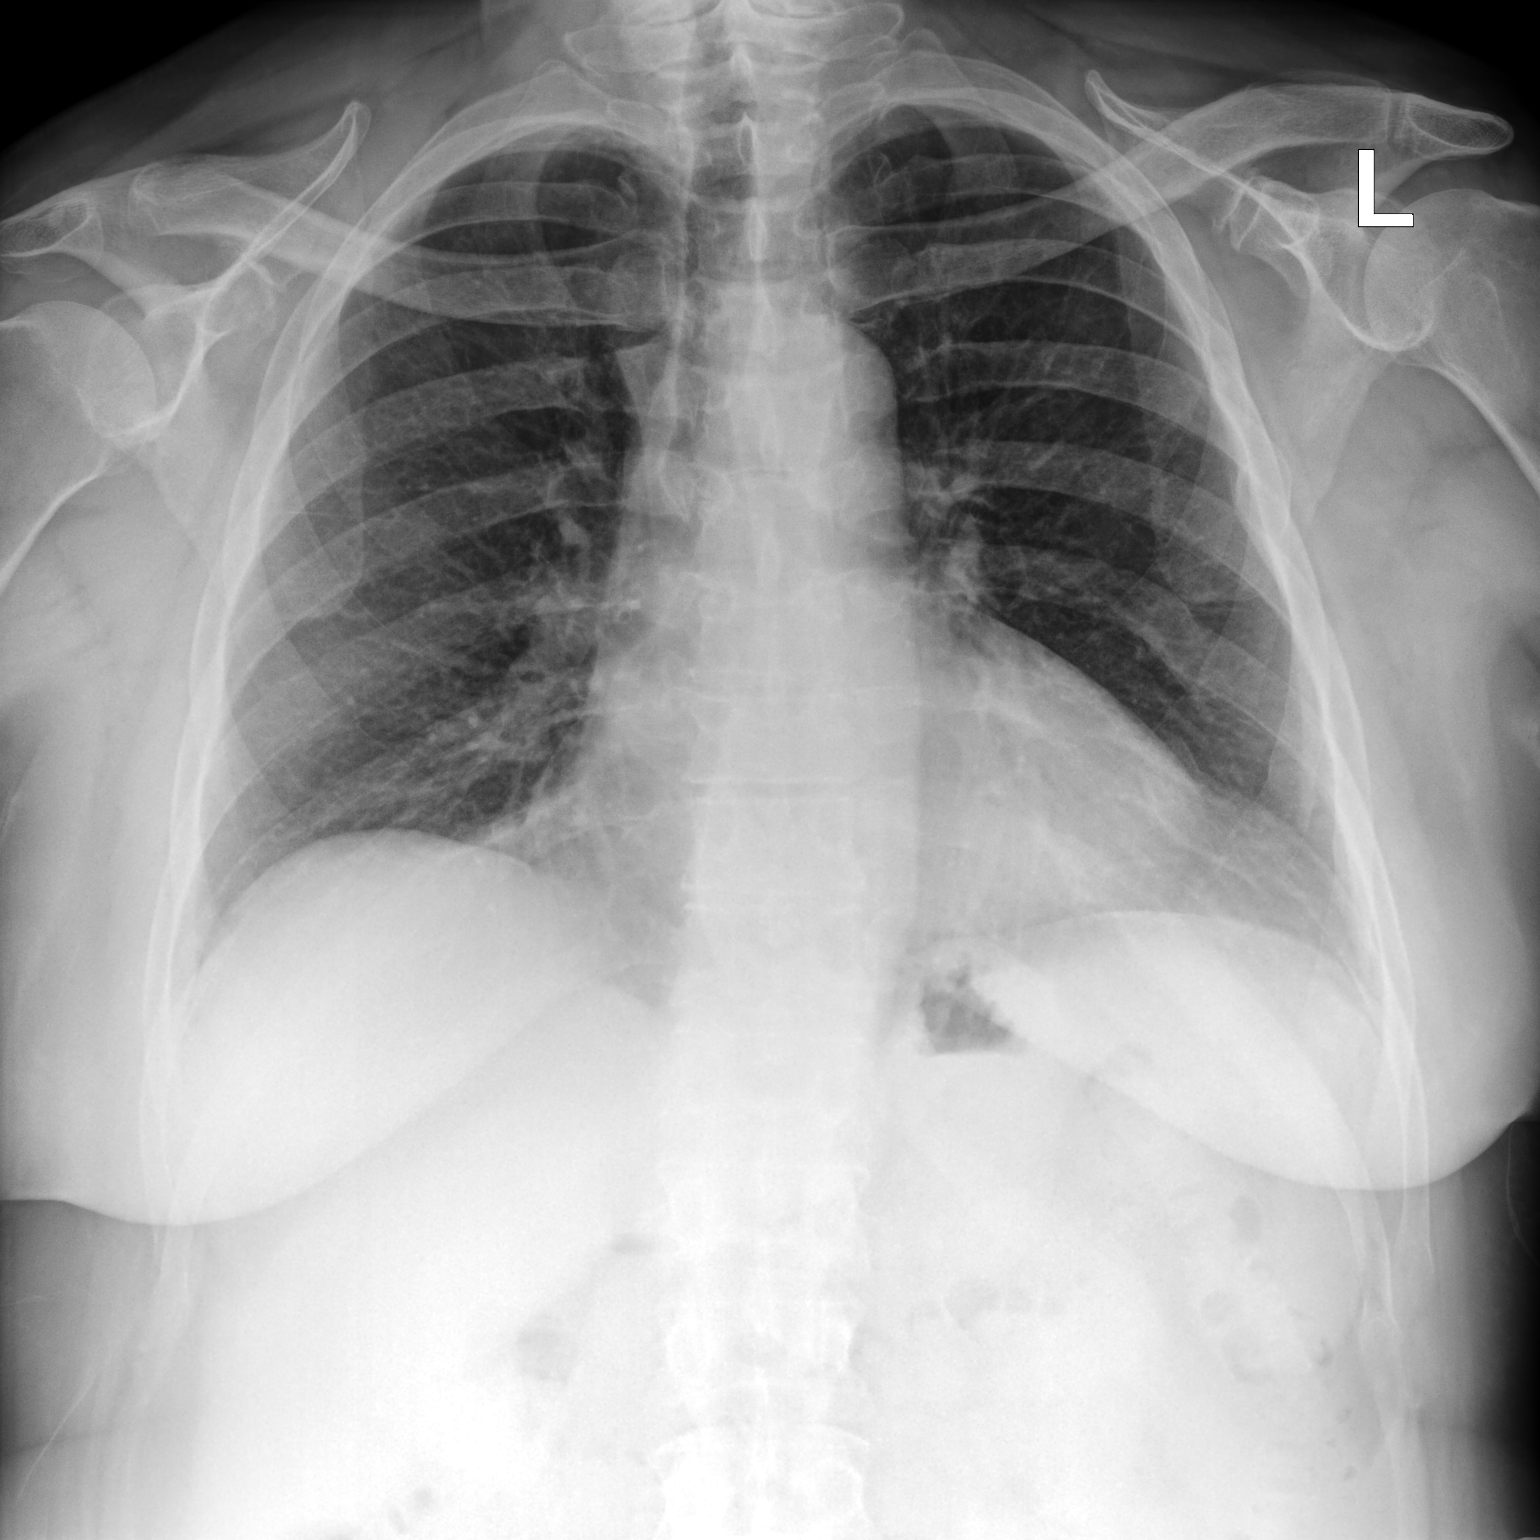

[chest lat]
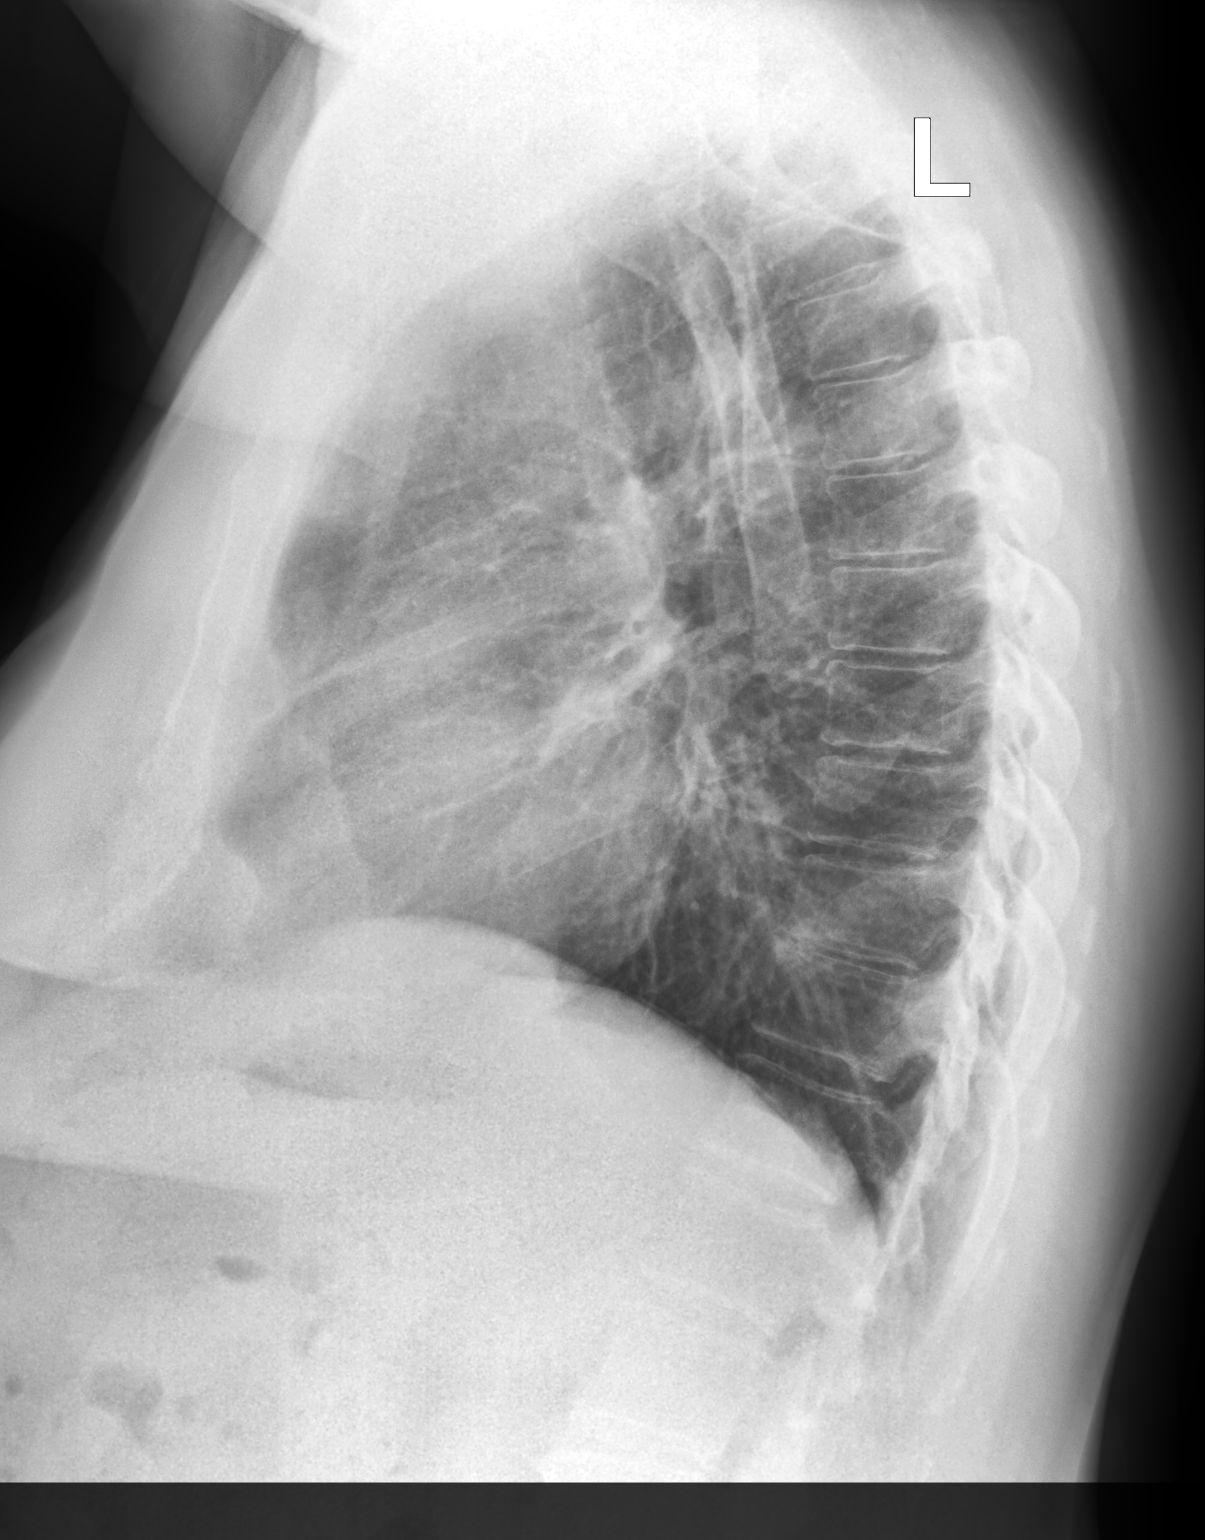

[2 of 2 positions shown; findings below may reference images not displayed]

FINDINGS: Healed left rib fractures. No nodule, mass, or focal infiltrate. The
cardiomediastinal silhouette is unremarkable.
IMPRESSION: No active cardiopulmonary disease.

## 2019-12-24 DIAGNOSIS — M9905 Segmental and somatic dysfunction of pelvic region: Secondary | ICD-10-CM | POA: Diagnosis not present

## 2019-12-24 DIAGNOSIS — M5137 Other intervertebral disc degeneration, lumbosacral region: Secondary | ICD-10-CM | POA: Diagnosis not present

## 2019-12-24 DIAGNOSIS — M9904 Segmental and somatic dysfunction of sacral region: Secondary | ICD-10-CM | POA: Diagnosis not present

## 2019-12-24 DIAGNOSIS — Q72891 Other reduction defects of right lower limb: Secondary | ICD-10-CM | POA: Diagnosis not present

## 2019-12-24 DIAGNOSIS — M9903 Segmental and somatic dysfunction of lumbar region: Secondary | ICD-10-CM | POA: Diagnosis not present

## 2019-12-25 DIAGNOSIS — M9903 Segmental and somatic dysfunction of lumbar region: Secondary | ICD-10-CM | POA: Diagnosis not present

## 2019-12-25 DIAGNOSIS — Q72891 Other reduction defects of right lower limb: Secondary | ICD-10-CM | POA: Diagnosis not present

## 2019-12-25 DIAGNOSIS — M5137 Other intervertebral disc degeneration, lumbosacral region: Secondary | ICD-10-CM | POA: Diagnosis not present

## 2019-12-25 DIAGNOSIS — M9905 Segmental and somatic dysfunction of pelvic region: Secondary | ICD-10-CM | POA: Diagnosis not present

## 2019-12-25 DIAGNOSIS — M9904 Segmental and somatic dysfunction of sacral region: Secondary | ICD-10-CM | POA: Diagnosis not present

## 2019-12-28 DIAGNOSIS — M9903 Segmental and somatic dysfunction of lumbar region: Secondary | ICD-10-CM | POA: Diagnosis not present

## 2019-12-28 DIAGNOSIS — M9905 Segmental and somatic dysfunction of pelvic region: Secondary | ICD-10-CM | POA: Diagnosis not present

## 2019-12-28 DIAGNOSIS — M5137 Other intervertebral disc degeneration, lumbosacral region: Secondary | ICD-10-CM | POA: Diagnosis not present

## 2019-12-28 DIAGNOSIS — M9904 Segmental and somatic dysfunction of sacral region: Secondary | ICD-10-CM | POA: Diagnosis not present

## 2019-12-28 DIAGNOSIS — Q72891 Other reduction defects of right lower limb: Secondary | ICD-10-CM | POA: Diagnosis not present

## 2019-12-29 DIAGNOSIS — M9904 Segmental and somatic dysfunction of sacral region: Secondary | ICD-10-CM | POA: Diagnosis not present

## 2019-12-29 DIAGNOSIS — M9903 Segmental and somatic dysfunction of lumbar region: Secondary | ICD-10-CM | POA: Diagnosis not present

## 2019-12-29 DIAGNOSIS — Q72891 Other reduction defects of right lower limb: Secondary | ICD-10-CM | POA: Diagnosis not present

## 2019-12-29 DIAGNOSIS — M5137 Other intervertebral disc degeneration, lumbosacral region: Secondary | ICD-10-CM | POA: Diagnosis not present

## 2019-12-29 DIAGNOSIS — M9905 Segmental and somatic dysfunction of pelvic region: Secondary | ICD-10-CM | POA: Diagnosis not present

## 2019-12-31 DIAGNOSIS — M9905 Segmental and somatic dysfunction of pelvic region: Secondary | ICD-10-CM | POA: Diagnosis not present

## 2019-12-31 DIAGNOSIS — M9904 Segmental and somatic dysfunction of sacral region: Secondary | ICD-10-CM | POA: Diagnosis not present

## 2019-12-31 DIAGNOSIS — M9903 Segmental and somatic dysfunction of lumbar region: Secondary | ICD-10-CM | POA: Diagnosis not present

## 2019-12-31 DIAGNOSIS — Q72891 Other reduction defects of right lower limb: Secondary | ICD-10-CM | POA: Diagnosis not present

## 2019-12-31 DIAGNOSIS — M5137 Other intervertebral disc degeneration, lumbosacral region: Secondary | ICD-10-CM | POA: Diagnosis not present

## 2020-01-01 DIAGNOSIS — M9904 Segmental and somatic dysfunction of sacral region: Secondary | ICD-10-CM | POA: Diagnosis not present

## 2020-01-01 DIAGNOSIS — M5137 Other intervertebral disc degeneration, lumbosacral region: Secondary | ICD-10-CM | POA: Diagnosis not present

## 2020-01-01 DIAGNOSIS — M9903 Segmental and somatic dysfunction of lumbar region: Secondary | ICD-10-CM | POA: Diagnosis not present

## 2020-01-01 DIAGNOSIS — Q72891 Other reduction defects of right lower limb: Secondary | ICD-10-CM | POA: Diagnosis not present

## 2020-01-01 DIAGNOSIS — M9905 Segmental and somatic dysfunction of pelvic region: Secondary | ICD-10-CM | POA: Diagnosis not present

## 2020-01-04 DIAGNOSIS — M9904 Segmental and somatic dysfunction of sacral region: Secondary | ICD-10-CM | POA: Diagnosis not present

## 2020-01-04 DIAGNOSIS — M5137 Other intervertebral disc degeneration, lumbosacral region: Secondary | ICD-10-CM | POA: Diagnosis not present

## 2020-01-04 DIAGNOSIS — Q72891 Other reduction defects of right lower limb: Secondary | ICD-10-CM | POA: Diagnosis not present

## 2020-01-04 DIAGNOSIS — M9903 Segmental and somatic dysfunction of lumbar region: Secondary | ICD-10-CM | POA: Diagnosis not present

## 2020-01-04 DIAGNOSIS — M9905 Segmental and somatic dysfunction of pelvic region: Secondary | ICD-10-CM | POA: Diagnosis not present

## 2020-01-05 DIAGNOSIS — M9904 Segmental and somatic dysfunction of sacral region: Secondary | ICD-10-CM | POA: Diagnosis not present

## 2020-01-05 DIAGNOSIS — M545 Low back pain: Secondary | ICD-10-CM | POA: Diagnosis not present

## 2020-01-05 DIAGNOSIS — M9903 Segmental and somatic dysfunction of lumbar region: Secondary | ICD-10-CM | POA: Diagnosis not present

## 2020-01-05 DIAGNOSIS — M25552 Pain in left hip: Secondary | ICD-10-CM | POA: Diagnosis not present

## 2020-01-05 DIAGNOSIS — M9905 Segmental and somatic dysfunction of pelvic region: Secondary | ICD-10-CM | POA: Diagnosis not present

## 2020-01-05 DIAGNOSIS — Q72891 Other reduction defects of right lower limb: Secondary | ICD-10-CM | POA: Diagnosis not present

## 2020-01-05 DIAGNOSIS — M5137 Other intervertebral disc degeneration, lumbosacral region: Secondary | ICD-10-CM | POA: Diagnosis not present

## 2020-01-07 DIAGNOSIS — Q72891 Other reduction defects of right lower limb: Secondary | ICD-10-CM | POA: Diagnosis not present

## 2020-01-07 DIAGNOSIS — M9903 Segmental and somatic dysfunction of lumbar region: Secondary | ICD-10-CM | POA: Diagnosis not present

## 2020-01-07 DIAGNOSIS — M9904 Segmental and somatic dysfunction of sacral region: Secondary | ICD-10-CM | POA: Diagnosis not present

## 2020-01-07 DIAGNOSIS — M9905 Segmental and somatic dysfunction of pelvic region: Secondary | ICD-10-CM | POA: Diagnosis not present

## 2020-01-07 DIAGNOSIS — M5137 Other intervertebral disc degeneration, lumbosacral region: Secondary | ICD-10-CM | POA: Diagnosis not present

## 2020-01-11 DIAGNOSIS — M1612 Unilateral primary osteoarthritis, left hip: Secondary | ICD-10-CM | POA: Diagnosis not present

## 2020-01-11 DIAGNOSIS — M5136 Other intervertebral disc degeneration, lumbar region: Secondary | ICD-10-CM | POA: Diagnosis not present

## 2020-01-12 DIAGNOSIS — M5137 Other intervertebral disc degeneration, lumbosacral region: Secondary | ICD-10-CM | POA: Diagnosis not present

## 2020-01-12 DIAGNOSIS — M9904 Segmental and somatic dysfunction of sacral region: Secondary | ICD-10-CM | POA: Diagnosis not present

## 2020-01-12 DIAGNOSIS — M9905 Segmental and somatic dysfunction of pelvic region: Secondary | ICD-10-CM | POA: Diagnosis not present

## 2020-01-12 DIAGNOSIS — Q72891 Other reduction defects of right lower limb: Secondary | ICD-10-CM | POA: Diagnosis not present

## 2020-01-12 DIAGNOSIS — M9903 Segmental and somatic dysfunction of lumbar region: Secondary | ICD-10-CM | POA: Diagnosis not present

## 2020-01-13 ENCOUNTER — Other Ambulatory Visit: Payer: Self-pay

## 2020-01-13 ENCOUNTER — Ambulatory Visit (INDEPENDENT_AMBULATORY_CARE_PROVIDER_SITE_OTHER): Payer: PPO

## 2020-01-13 DIAGNOSIS — M1612 Unilateral primary osteoarthritis, left hip: Secondary | ICD-10-CM | POA: Diagnosis not present

## 2020-01-13 DIAGNOSIS — Z1211 Encounter for screening for malignant neoplasm of colon: Secondary | ICD-10-CM

## 2020-01-13 DIAGNOSIS — Z0000 Encounter for general adult medical examination without abnormal findings: Secondary | ICD-10-CM

## 2020-01-13 DIAGNOSIS — M5136 Other intervertebral disc degeneration, lumbar region: Secondary | ICD-10-CM | POA: Diagnosis not present

## 2020-01-13 NOTE — Patient Instructions (Signed)
David Vincent , Thank you for taking time to come for your Medicare Wellness Visit. I appreciate your ongoing commitment to your health goals. Please review the following plan we discussed and let me know if I can assist you in the future.   Screening recommendations/referrals: Colonoscopy: Currently due, orders placed to Gastroenterology this visit Recommended yearly ophthalmology/optometry visit for glaucoma screening and checkup Recommended yearly dental visit for hygiene and checkup  Vaccinations: Influenza vaccine: Currently due, you may receive in office or at your next office visit Pneumococcal vaccine: completed series Tdap vaccine: Up to date, next due 03/07/2027 Shingles vaccine: Currently due for shingrix, if you wish to receive you may obtain at your local pharmacist     Advanced directives: Advance directive discussed with you today. Even though you declined this today please call our office should you change your mind and we can give you the proper paperwork for you to fill out.   Conditions/risks identified: None   Next appointment: 01/27/2020 @ 8:30 am with David Vincent  Preventive Care 65 Years and Older, Male Preventive care refers to lifestyle choices and visits with your health care provider that can promote health and wellness. What does preventive care include?  A yearly physical exam. This is also called an annual well check.  Dental exams once or twice a year.  Routine eye exams. Ask your health care provider how often you should have your eyes checked.  Personal lifestyle choices, including:  Daily care of your teeth and gums.  Regular physical activity.  Eating a healthy diet.  Avoiding tobacco and drug use.  Limiting alcohol use.  Practicing safe sex.  Taking low doses of aspirin every day.  Taking vitamin and mineral supplements as recommended by your health care provider. What happens during an annual well check? The services and screenings  done by your health care provider during your annual well check will depend on your age, overall health, lifestyle risk factors, and family history of disease. Counseling  Your health care provider may ask you questions about your:  Alcohol use.  Tobacco use.  Drug use.  Emotional well-being.  Home and relationship well-being.  Sexual activity.  Eating habits.  History of falls.  Memory and ability to understand (cognition).  Work and work Astronomer. Screening  You may have the following tests or measurements:  Height, weight, and BMI.  Blood pressure.  Lipid and cholesterol levels. These may be checked every 5 years, or more frequently if you are over 25 years old.  Skin check.  Lung cancer screening. You may have this screening every year starting at age 14 if you have a 30-pack-year history of smoking and currently smoke or have quit within the past 15 years.  Fecal occult blood test (FOBT) of the stool. You may have this test every year starting at age 68.  Flexible sigmoidoscopy or colonoscopy. You may have a sigmoidoscopy every 5 years or a colonoscopy every 10 years starting at age 62.  Prostate cancer screening. Recommendations will vary depending on your family history and other risks.  Hepatitis C blood test.  Hepatitis B blood test.  Sexually transmitted disease (STD) testing.  Diabetes screening. This is done by checking your blood sugar (glucose) after you have not eaten for a while (fasting). You may have this done every 1-3 years.  Abdominal aortic aneurysm (AAA) screening. You may need this if you are a current or former smoker.  Osteoporosis. You may be screened starting at age 96  if you are at high risk. Talk with your health care provider about your test results, treatment options, and if necessary, the need for more tests. Vaccines  Your health care provider may recommend certain vaccines, such as:  Influenza vaccine. This is recommended  every year.  Tetanus, diphtheria, and acellular pertussis (Tdap, Td) vaccine. You may need a Td booster every 10 years.  Zoster vaccine. You may need this after age 56.  Pneumococcal 13-valent conjugate (PCV13) vaccine. One dose is recommended after age 15.  Pneumococcal polysaccharide (PPSV23) vaccine. One dose is recommended after age 44. Talk to your health care provider about which screenings and vaccines you need and how often you need them. This information is not intended to replace advice given to you by your health care provider. Make sure you discuss any questions you have with your health care provider. Document Released: 04/22/2015 Document Revised: 12/14/2015 Document Reviewed: 01/25/2015 Elsevier Interactive Patient Education  2017 [place] Prevention in the Home Falls can cause injuries. They can happen to people of all ages. There are many things you can do to make your home safe and to help prevent falls. What can I do on the outside of my home?  Regularly fix the edges of walkways and driveways and fix any cracks.  Remove anything that might make you trip as you walk through a door, such as a raised step or threshold.  Trim any bushes or trees on the path to your home.  Use bright outdoor lighting.  Clear any walking paths of anything that might make someone trip, such as rocks or tools.  Regularly check to see if handrails are loose or broken. Make sure that both sides of any steps have handrails.  Any raised decks and porches should have guardrails on the edges.  Have any leaves, snow, or ice cleared regularly.  Use sand or salt on walking paths during winter.  Clean up any spills in your garage right away. This includes oil or grease spills. What can I do in the bathroom?  Use night lights.  Install grab bars by the toilet and in the tub and shower. Do not use towel bars as grab bars.  Use non-skid mats or decals in the tub or shower.  If  you need to sit down in the shower, use a plastic, non-slip stool.  Keep the floor dry. Clean up any water that spills on the floor as soon as it happens.  Remove soap buildup in the tub or shower regularly.  Attach bath mats securely with double-sided non-slip rug tape.  Do not have throw rugs and other things on the floor that can make you trip. What can I do in the bedroom?  Use night lights.  Make sure that you have a light by your bed that is easy to reach.  Do not use any sheets or blankets that are too big for your bed. They should not hang down onto the floor.  Have a firm chair that has side arms. You can use this for support while you get dressed.  Do not have throw rugs and other things on the floor that can make you trip. What can I do in the kitchen?  Clean up any spills right away.  Avoid walking on wet floors.  Keep items that you use a lot in easy-to-reach places.  If you need to reach something above you, use a strong step stool that has a grab bar.  Keep electrical  cords out of the way.  Do not use floor polish or wax that makes floors slippery. If you must use wax, use non-skid floor wax.  Do not have throw rugs and other things on the floor that can make you trip. What can I do with my stairs?  Do not leave any items on the stairs.  Make sure that there are handrails on both sides of the stairs and use them. Fix handrails that are broken or loose. Make sure that handrails are as long as the stairways.  Check any carpeting to make sure that it is firmly attached to the stairs. Fix any carpet that is loose or worn.  Avoid having throw rugs at the top or bottom of the stairs. If you do have throw rugs, attach them to the floor with carpet tape.  Make sure that you have a light switch at the top of the stairs and the bottom of the stairs. If you do not have them, ask someone to add them for you. What else can I do to help prevent falls?  Wear shoes  that:  Do not have high heels.  Have rubber bottoms.  Are comfortable and fit you well.  Are closed at the toe. Do not wear sandals.  If you use a stepladder:  Make sure that it is fully opened. Do not climb a closed stepladder.  Make sure that both sides of the stepladder are locked into place.  Ask someone to hold it for you, if possible.  Clearly mark and make sure that you can see:  Any grab bars or handrails.  First and last steps.  Where the edge of each step is.  Use tools that help you move around (mobility aids) if they are needed. These include:  Canes.  Walkers.  Scooters.  Crutches.  Turn on the lights when you go into a dark area. Replace any light bulbs as soon as they burn out.  Set up your furniture so you have a clear path. Avoid moving your furniture around.  If any of your floors are uneven, fix them.  If there are any pets around you, be aware of where they are.  Review your medicines with your doctor. Some medicines can make you feel dizzy. This can increase your chance of falling. Ask your doctor what other things that you can do to help prevent falls. This information is not intended to replace advice given to you by your health care provider. Make sure you discuss any questions you have with your health care provider. Document Released: 01/20/2009 Document Revised: 09/01/2015 Document Reviewed: 04/30/2014 Elsevier Interactive Patient Education  2017 [employer].

## 2020-01-13 NOTE — Progress Notes (Signed)
Subjective:   David Vincent is a 69 y.o. male who presents for Medicare Annual/Subsequent preventive examination.  I connected with David Vincent today by telephone and verified that I am speaking with the correct person using two identifiers. Location patient: home Location provider: work Persons participating in the virtual visit: patient, provider.   I discussed the limitations, risks, security and privacy concerns of performing an evaluation and management service by telephone and the availability of in person appointments. I also discussed with the patient that there may be a patient responsible charge related to this service. The patient expressed understanding and verbally consented to this telephonic visit.    Interactive audio and video telecommunications were attempted between this provider and patient, however failed, due to patient having technical difficulties OR patient did not have access to video capability.  We continued and completed visit with audio only.      Review of Systems    N/A  Cardiac Risk Factors include: advanced age (>38men, >70 women);male gender;hypertension     Objective:    Today's Vitals   01/13/20 1317  PainSc: 5    There is no height or weight on file to calculate BMI.  Advanced Directives 01/13/2020 08/16/2019 02/09/2019 11/17/2015 09/08/2015 12/29/2011 05/02/2011  Does Patient Have a Medical Advance Directive? No No No No No Patient does not have advance directive;Patient would not like information Patient does not have advance directive  Does patient want to make changes to medical advance directive? No - Patient declined - - - - - -  Would patient like information on creating a medical advance directive? - No - Patient declined No - Patient declined No - patient declined information - - -  Pre-existing out of facility DNR order (yellow form or pink MOST form) - - - - - - No    Current Medications (verified) Outpatient Encounter Medications  as of 01/13/2020  Medication Sig  . acetaminophen (TYLENOL) 500 MG tablet Take 500 mg by mouth every 6 (six) hours as needed.  Marland Kitchen atorvastatin (LIPITOR) 20 MG tablet Take 1 tablet by mouth once daily  . losartan (COZAAR) 100 MG tablet Take 1 tablet by mouth once daily  . omeprazole (PRILOSEC) 20 MG capsule Take 1 capsule (20 mg total) by mouth every morning. (Patient taking differently: Take 20 mg by mouth daily. )  . gabapentin (NEURONTIN) 300 MG capsule Take 1 capsule (300 mg total) by mouth 2 (two) times daily. (Patient taking differently: Take 300 mg by mouth at bedtime. )  . ibuprofen (ADVIL) 600 MG tablet Take 1 tablet (600 mg total) by mouth every 6 (six) hours as needed. (Patient not taking: Reported on 01/13/2020)  . meloxicam (MOBIC) 15 MG tablet Take 15 mg by mouth daily.  . methocarbamol (ROBAXIN) 500 MG tablet Take 500 mg by mouth 3 (three) times daily as needed.  . mupirocin ointment (BACTROBAN) 2 % Apply 1 application topically 3 (three) times daily. (Patient not taking: Reported on 01/13/2020)   No facility-administered encounter medications on file as of 01/13/2020.    Allergies (verified) Bee venom, Gabapentin, Codeine phosphate, and Penicillins   History: Past Medical History:  Diagnosis Date  . BACK PAIN 03/10/2007  . CAD (coronary artery disease)    A.  01/05/2002 Cath - nonobs dzs (LAD 20prox, RI 30-40 prox)  . ED (erectile dysfunction)   . GERD (gastroesophageal reflux disease)   . History of mumps orchitis   . HYPERTENSION 11/15/2006  . INGUINAL HERNIA, RIGHT 12/05/2009  .  LEG CRAMPS 12/03/2008  . SLEEP APNEA 11/16/2008  . Unstable angina (HCC)   . VERTIGO, POSITIONAL 08/08/2009   Past Surgical History:  Procedure Laterality Date  . CARDIAC CATHETERIZATION    . FOOT SURGERY Right 2019  . KNEE ARTHROSCOPY     Right   Family History  Problem Relation Age of Onset  . Pneumonia Mother        died 3488 - hip fx/pna  . Heart attack Father        died 5890  .  Hypertension Sister        alive  . Cancer Brother        died - agent orange exposure  . Hypertension Brother        alive   Social History   Socioeconomic History  . Marital status: Married    Spouse name: Not on file  . Number of children: Not on file  . Years of education: Not on file  . Highest education level: Not on file  Occupational History  . Not on file  Tobacco Use  . Smoking status: Never Smoker  . Smokeless tobacco: Never Used  Substance and Sexual Activity  . Alcohol use: No  . Drug use: No  . Sexual activity: Not on file  Other Topics Concern  . Not on file  Social History Narrative   05/02/2011:  Currently unemployed.  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  Does not exercise or adhere to any specific diet.   Social Determinants of Health   Financial Resource Strain: Low Risk   . Difficulty of Paying Living Expenses: Not hard at all  Food Insecurity: No Food Insecurity  . Worried About Programme researcher, broadcasting/film/videounning Out of Food in the Last Year: Never true  . Ran Out of Food in the Last Year: Never true  Transportation Needs: No Transportation Needs  . Lack of Transportation (Medical): No  . Lack of Transportation (Non-Medical): No  Physical Activity: Inactive  . Days of Exercise per Week: 0 days  . Minutes of Exercise per Session: 0 min  Stress: No Stress Concern Present  . Feeling of Stress : Not at all  Social Connections: Moderately Isolated  . Frequency of Communication with Friends and Family: More than three times a week  . Frequency of Social Gatherings with Friends and Family: More than three times a week  . Attends Religious Services: Never  . Active Member of Clubs or Organizations: No  . Attends BankerClub or Organization Meetings: Never  . Marital Status: Married    Tobacco Counseling Counseling given: Not Answered   Clinical Intake:  Pre-visit preparation completed: Yes  Pain : 0-10 Pain Score: 5  Pain Type: Chronic pain Pain  Location: Back Pain Orientation: Lower Pain Descriptors / Indicators: Throbbing Pain Onset: More than a month ago Pain Frequency: Intermittent Pain Relieving Factors: Heat, therapy  Pain Relieving Factors: Heat, therapy  Nutritional Risks: None Diabetes: No  How often do you need to have someone help you when you read instructions, pamphlets, or other written materials from your doctor or pharmacy?: 1 - Never What is the last grade level you completed in school?: 12th grade  Diabetic?No  Interpreter Needed?: No  Information entered by :: SCrews,LPN   Activities of Daily Living In your present state of health, do you have any difficulty performing the following activities: 01/13/2020  Hearing? N  Vision? N  Difficulty concentrating or making decisions? N  Walking or  climbing stairs? Y  Comment Has issues with walking downstairs due to back pain and hip pain  Dressing or bathing? N  Doing errands, shopping? N  Preparing Food and eating ? N  Using the Toilet? N  In the past six months, have you accidently leaked urine? N  Do you have problems with loss of bowel control? N  Managing your Medications? N  Managing your Finances? N  Housekeeping or managing your Housekeeping? N  Some recent data might be hidden    Patient Care Team: [name], NP as PCP - General (Family Medicine)  Indicate any recent Medical Services you may have received from other than [facility] providers in the past year (date may be approximate).     Assessment:   This is a routine wellness examination for Ilija.  Hearing/Vision screen  Hearing Screening   125Hz  250Hz  500Hz  1000Hz  2000Hz  3000Hz  4000Hz  6000Hz  8000Hz   Right ear:           Left ear:           Vision Screening Comments: Patient states gets eyes examined yearly  Dietary issues and exercise activities discussed: Current Exercise Habits: The patient has a physically strenuous job, but has no regular exercise apart from work.  Goals     .  Weight (lb) < 200 lb (90.7 kg) (pt-stated)      Depression Screen PHQ 2/9 Scores 01/13/2020 02/11/2019 09/30/2017 09/25/2016 09/23/2015 03/09/2014  PHQ - 2 Score 0 0 0 0 0 0  PHQ- 9 Score 0 - - - - -    Fall Risk Fall Risk  01/13/2020 03/04/2019 02/11/2019 09/30/2017 09/25/2016  Falls in the past year? 0 0 0 No No  Comment - Emmi Telephone Survey: data to providers prior to load - - -  Number falls in past yr: 0 - 0 - -  Injury with Fall? 0 - 0 - -  Risk for fall due to : No Fall Risks - - - -  Follow up Falls evaluation completed;Falls prevention discussed - Falls evaluation completed - -    Any stairs in or around the home? No  If so, are there any without handrails? No  Home free of loose throw rugs in walkways, pet beds, electrical cords, etc? Yes  Adequate lighting in your home to reduce risk of falls? Yes   ASSISTIVE DEVICES UTILIZED TO PREVENT FALLS:  Life alert? No  Use of a cane, walker or w/c? No  Grab bars in the bathroom? Yes  Shower chair or bench in shower? No  Elevated toilet seat or a handicapped toilet? No     Cognitive Function:  Cognitive screening not indicated based on direct observation      Immunizations Immunization History  Administered Date(s) Administered  . Fluad Quad(high Dose 65+) 01/22/2019  . Influenza Split 12/14/2011  . Influenza,inj,Quad PF,6+ Mos 03/09/2014  . PFIZER SARS-COV-2 Vaccination 06/19/2019, 07/13/2019  . Pneumococcal Conjugate-13 04/16/2016  . Pneumococcal Polysaccharide-23 09/30/2017  . Tdap 03/07/2015  . Zoster 10/25/2015    TDAP status: Up to date Flu Vaccine status: Up to date Pneumococcal vaccine status: Up to date Covid-19 vaccine status: Completed vaccines  Qualifies for Shingles Vaccine? Yes   Zostavax completed Yes   Shingrix Completed?: No.    Education has been provided regarding the importance of this vaccine. Patient has been advised to call insurance company to determine out of pocket expense if they  have not yet received this vaccine. Advised may also receive vaccine at  local pharmacy or Health Dept. Verbalized acceptance and understanding.  Screening Tests Health Maintenance  Topic Date Due  . INFLUENZA VACCINE  11/08/2019  . COLONOSCOPY  01/10/2020  . TETANUS/TDAP  03/06/2025  . COVID-19 Vaccine  Completed  . Hepatitis C Screening  Completed  . PNA vac Low Risk Adult  Completed    Health Maintenance  Health Maintenance Due  Topic Date Due  . INFLUENZA VACCINE  11/08/2019  . COLONOSCOPY  01/10/2020    Colorectal cancer screening: Referral to GI placed 01/13/2020. Pt aware the office will call re: appt.  Lung Cancer Screening: (Low Dose CT Chest recommended if Age 81-80 years, 30 pack-year currently smoking OR have quit w/in 15years.) does not qualify.   Lung Cancer Screening Referral:N/A   Additional Screening:  Hepatitis C Screening: does qualify; Completed 09/30/2017  Vision Screening: Recommended annual ophthalmology exams for early detection of glaucoma and other disorders of the eye. Is the patient up to date with their annual eye exam?  Yes  Who is the provider or what is the name of the office in which the patient attends annual eye exams? Patient unsure of name of the office  If pt is not established with a provider, would they like to be referred to a provider to establish care? No .   Dental Screening: Recommended annual dental exams for proper oral hygiene  Community Resource Referral / Chronic Care Management: CRR required this visit?  No   CCM required this visit?  No      Plan:     I have personally reviewed and noted the following in the patient's chart:   . Medical and social history . Use of alcohol, tobacco or illicit drugs  . Current medications and supplements . Functional ability and status . Nutritional status . Physical activity . Advanced directives . List of other physicians . Hospitalizations, surgeries, and ER visits in  previous 12 months . Vitals . Screenings to include cognitive, depression, and falls . Referrals and appointments  In addition, I have reviewed and discussed with patient certain preventive protocols, quality metrics, and best practice recommendations. A written personalized care plan for preventive services as well as general preventive health recommendations were provided to patient.     Theodora Blow, LPN   [id number]   Nurse Notes: None

## 2020-01-17 ENCOUNTER — Other Ambulatory Visit: Payer: Self-pay | Admitting: Adult Health

## 2020-01-18 DIAGNOSIS — M1612 Unilateral primary osteoarthritis, left hip: Secondary | ICD-10-CM | POA: Diagnosis not present

## 2020-01-18 DIAGNOSIS — M5136 Other intervertebral disc degeneration, lumbar region: Secondary | ICD-10-CM | POA: Diagnosis not present

## 2020-01-20 DIAGNOSIS — M1612 Unilateral primary osteoarthritis, left hip: Secondary | ICD-10-CM | POA: Diagnosis not present

## 2020-01-20 DIAGNOSIS — M5136 Other intervertebral disc degeneration, lumbar region: Secondary | ICD-10-CM | POA: Diagnosis not present

## 2020-01-25 DIAGNOSIS — M1612 Unilateral primary osteoarthritis, left hip: Secondary | ICD-10-CM | POA: Diagnosis not present

## 2020-01-25 DIAGNOSIS — M5136 Other intervertebral disc degeneration, lumbar region: Secondary | ICD-10-CM | POA: Diagnosis not present

## 2020-01-26 DIAGNOSIS — M1711 Unilateral primary osteoarthritis, right knee: Secondary | ICD-10-CM | POA: Diagnosis not present

## 2020-01-26 DIAGNOSIS — M25561 Pain in right knee: Secondary | ICD-10-CM | POA: Diagnosis not present

## 2020-01-27 ENCOUNTER — Other Ambulatory Visit: Payer: Self-pay

## 2020-01-27 ENCOUNTER — Encounter: Payer: Self-pay | Admitting: Adult Health

## 2020-01-27 ENCOUNTER — Ambulatory Visit (INDEPENDENT_AMBULATORY_CARE_PROVIDER_SITE_OTHER): Payer: PPO | Admitting: Adult Health

## 2020-01-27 VITALS — BP 150/88 | HR 71 | Temp 97.9°F | Ht 68.0 in | Wt 253.0 lb

## 2020-01-27 DIAGNOSIS — G4730 Sleep apnea, unspecified: Secondary | ICD-10-CM

## 2020-01-27 DIAGNOSIS — Z125 Encounter for screening for malignant neoplasm of prostate: Secondary | ICD-10-CM | POA: Diagnosis not present

## 2020-01-27 DIAGNOSIS — Z23 Encounter for immunization: Secondary | ICD-10-CM

## 2020-01-27 DIAGNOSIS — Z0000 Encounter for general adult medical examination without abnormal findings: Secondary | ICD-10-CM

## 2020-01-27 DIAGNOSIS — I10 Essential (primary) hypertension: Secondary | ICD-10-CM

## 2020-01-27 DIAGNOSIS — I2510 Atherosclerotic heart disease of native coronary artery without angina pectoris: Secondary | ICD-10-CM | POA: Diagnosis not present

## 2020-01-27 DIAGNOSIS — K219 Gastro-esophageal reflux disease without esophagitis: Secondary | ICD-10-CM

## 2020-01-27 MED ORDER — AMLODIPINE BESYLATE 5 MG PO TABS: 5.0000 mg | ORAL_TABLET | Freq: Every day | ORAL | 3 refills | Status: DC

## 2020-01-27 MED ORDER — MECLIZINE HCL 25 MG PO TABS: 25.0000 mg | ORAL_TABLET | Freq: Three times a day (TID) | ORAL | 2 refills | Status: DC | PRN

## 2020-01-27 NOTE — Patient Instructions (Signed)
It as great seeing you today   I am going to add an additional blood pressure medication called Norvasc. Take this daily and follow up with me in a month to see how you are doing   We will follow up with you regarding your labs   Please continue to work on weight loss   Call and schedule your colonoscopy

## 2020-01-27 NOTE — Progress Notes (Signed)
Subjective:    Patient ID: David Vincent, male    DOB: Aug 26, 1950, 69 y.o.   MRN: [id number]  HPI Patient presents for yearly preventative medicine examination. He is a pleasant 69 year old male who  has a past medical history of BACK PAIN (03/10/2007), CAD (coronary artery disease), ED (erectile dysfunction), GERD (gastroesophageal reflux disease), History of mumps orchitis, HYPERTENSION (11/15/2006), INGUINAL HERNIA, RIGHT (12/05/2009), LEG CRAMPS (12/03/2008), SLEEP APNEA (11/16/2008), Unstable angina ([place]), and VERTIGO, POSITIONAL (08/08/2009).  Hypertension -currently prescribed Losartan 100 mg daily, he does report taking this daily. He does not monitor at home. When he was at orthopedics  He denies dizziness, lightheadedness, chest pain, shortness of breath or syncopal episodes BP Readings from Last 3 Encounters:  01/27/20 (!) 150/88  08/16/19 (!) 158/90  05/13/19 (!) 165/93    Hyperlipidemia/CAD-currently prescribed Lipitor 20 mg daily he denies myalgia, fatigue, or chest pain Lab Results  Component Value Date   CHOL 97 01/22/2019   HDL 34.40 (L) 01/22/2019   LDLCALC 40 01/22/2019   TRIG 115.0 01/22/2019   CHOLHDL 3 01/22/2019    GERD-controlled with Prilosec 20 mg  OSA - refuses to wear CPAP   Osteoarthritis of multiple joints - is seen by Orthopedics on a regular basis    All immunizations and health maintenance protocols were reviewed with the patient and needed orders were placed.  Appropriate screening laboratory values were ordered for the patient including screening of hyperlipidemia, renal function and hepatic function.  Medication reconciliation,  past medical history, social history, problem list and allergies were reviewed in detail with the patient  Goals were established with regard to weight loss, exercise, and  diet in compliance with medications. He tries to stay active but does not exercise routinely due to arthritic pain. He does try to eat healthy.  Wt  Readings from Last 3 Encounters:  01/27/20 253 lb (114.8 kg)  08/16/19 250 lb (113.4 kg)  02/11/19 263 lb 6.4 oz (119.5 kg)    He is due for a colonoscopy but has not called to schedule yet    Review of Systems  Constitutional: Negative.   HENT: Negative.   Eyes: Negative.   Respiratory: Negative.   Cardiovascular: Negative.   Gastrointestinal: Negative.   Endocrine: Negative.   Genitourinary: Negative.   Musculoskeletal: Positive for arthralgias and back pain.  Skin: Negative.   Allergic/Immunologic: Negative.   Neurological: Negative.   Hematological: Negative.   Psychiatric/Behavioral: Negative.   All other systems reviewed and are negative.  Past Medical History:  Diagnosis Date   BACK PAIN 03/10/2007   CAD (coronary artery disease)    A.  01/05/2002 Cath - nonobs dzs (LAD 20prox, RI 30-40 prox)   ED (erectile dysfunction)    GERD (gastroesophageal reflux disease)    History of mumps orchitis    HYPERTENSION 11/15/2006   INGUINAL HERNIA, RIGHT 12/05/2009   LEG CRAMPS 12/03/2008   SLEEP APNEA 11/16/2008   Unstable angina ([place])    VERTIGO, POSITIONAL 08/08/2009    Social History   Socioeconomic History   Marital status: Married    Spouse name: Not on file   Number of children: Not on file   Years of education: Not on file   Highest education level: Not on file  Occupational History   Not on file  Tobacco Use   Smoking status: Never Smoker   Smokeless tobacco: Never Used  Substance and Sexual Activity   Alcohol use: No   Drug use: No  Sexual activity: Not on file  Other Topics Concern   Not on file  Social History Narrative   05/02/2011:  Currently unemployed.  Prev worked in a Hotel manager.  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  Does not exercise or adhere to any specific diet.   Social Determinants of Health   Financial Resource Strain: Low Risk    Difficulty of Paying Living Expenses: Not hard at all  Food Insecurity: No Food Insecurity    Worried About Charity fundraiser in the Last Year: Never true   [place] in the Last Year: Never true  Transportation Needs: No Transportation Needs   Lack of Transportation (Medical): No   Lack of Transportation (Non-Medical): No  Physical Activity: Inactive   Days of Exercise per Week: 0 days   Minutes of Exercise per Session: 0 min  Stress: No Stress Concern Present   Feeling of Stress : Not at all  Social Connections: Moderately Isolated   Frequency of Communication with Friends and Family: More than three times a week   Frequency of Social Gatherings with Friends and Family: More than three times a week   Attends Religious Services: Never   Marine scientist or Organizations: No   Attends Music therapist: Never   Marital Status: Married  Human resources officer Violence: Not At Risk   Fear of Current or Ex-Partner: No   Emotionally Abused: No   Physically Abused: No   Sexually Abused: No    Past Surgical History:  Procedure Laterality Date   CARDIAC CATHETERIZATION     FOOT SURGERY Right 2019   KNEE ARTHROSCOPY     Right    Family History  Problem Relation Age of Onset   Pneumonia Mother        died 52 - hip fx/pna   Heart attack Father        died 9   Hypertension Sister        alive   Cancer Brother        died - agent orange exposure   Hypertension Brother        alive    Allergies  Allergen Reactions   Bee Venom Anaphylaxis   Gabapentin Other (See Comments)    Hallucinations    Codeine Phosphate Itching   Penicillins Other (See Comments)    Has patient had a PCN reaction causing immediate rash, facial/tongue/throat swelling, SOB or lightheadedness with hypotension: Unknown, childhood reaction Has patient had a PCN reaction causing severe rash involving mucus membranes or skin necrosis: No Has patient had a PCN reaction that required hospitalization No Has patient had a PCN reaction occurring within the  last 10 years: No If all of the above answers are "NO", then may proceed with Cephalosporin use.     Current Outpatient Medications on File Prior to Visit  Medication Sig Dispense Refill   acetaminophen (TYLENOL) 500 MG tablet Take 500 mg by mouth every 6 (six) hours as needed.     atorvastatin (LIPITOR) 20 MG tablet Take 1 tablet by mouth once daily 90 tablet 3   ibuprofen (ADVIL) 600 MG tablet Take 1 tablet (600 mg total) by mouth every 6 (six) hours as needed. 30 tablet 0   losartan (COZAAR) 100 MG tablet Take 1 tablet by mouth once daily 90 tablet 0   meloxicam (MOBIC) 15 MG tablet Take 15 mg by mouth daily.     methocarbamol (ROBAXIN) 500 MG tablet Take 500 mg by  mouth 3 (three) times daily as needed.     mupirocin ointment (BACTROBAN) 2 % Apply 1 application topically 3 (three) times daily. 22 g 0   omeprazole (PRILOSEC) 20 MG capsule Take 1 capsule (20 mg total) by mouth every morning. (Patient taking differently: Take 20 mg by mouth daily. ) 90 capsule 3   EPINEPHrine 0.3 mg/0.3 mL IJ SOAJ injection Inject into the muscle. (Patient not taking: Reported on 01/27/2020)     gabapentin (NEURONTIN) 300 MG capsule Take 1 capsule (300 mg total) by mouth 2 (two) times daily. (Patient taking differently: Take 300 mg by mouth at bedtime. ) 60 capsule 0   No current facility-administered medications on file prior to visit.    BP (!) 150/88 (BP Location: Left Arm, Patient Position: Sitting, Cuff Size: Large)    Pulse 71    Temp 97.9 F (36.6 C) (Oral)    Ht 5' 8"  (1.727 m)    Wt 253 lb (114.8 kg)    SpO2 98%    BMI 38.47 kg/m       Objective:   Physical Exam Vitals and nursing note reviewed.  Constitutional:      General: He is not in acute distress.    Appearance: Normal appearance. He is well-developed. He is obese.  HENT:     Head: Normocephalic and atraumatic.     Right Ear: Tympanic membrane, ear canal and external ear normal. There is no impacted cerumen.     Left Ear:  Tympanic membrane, ear canal and external ear normal. There is no impacted cerumen.     Nose: Nose normal. No congestion or rhinorrhea.     Mouth/Throat:     Mouth: Mucous membranes are moist.     Pharynx: Oropharynx is clear. No oropharyngeal exudate or posterior oropharyngeal erythema.  Eyes:     General:        Right eye: No discharge.        Left eye: No discharge.     Extraocular Movements: Extraocular movements intact.     Conjunctiva/sclera: Conjunctivae normal.     Pupils: Pupils are equal, round, and reactive to light.  Neck:     Vascular: No carotid bruit.     Trachea: No tracheal deviation.  Cardiovascular:     Rate and Rhythm: Normal rate and regular rhythm.     Pulses: Normal pulses.     Heart sounds: Normal heart sounds. No murmur heard.  No friction rub. No gallop.   Pulmonary:     Effort: Pulmonary effort is normal. No respiratory distress.     Breath sounds: Normal breath sounds. No stridor. No wheezing, rhonchi or rales.  Chest:     Chest wall: No tenderness.  Abdominal:     General: Bowel sounds are normal. There is no distension.     Palpations: Abdomen is soft. There is no mass.     Tenderness: There is no abdominal tenderness. There is no right CVA tenderness, left CVA tenderness, guarding or rebound.     Hernia: No hernia is present.  Musculoskeletal:        General: No swelling, tenderness, deformity or signs of injury. Normal range of motion.     Right lower leg: No edema.     Left lower leg: No edema.  Lymphadenopathy:     Cervical: No cervical adenopathy.  Skin:    General: Skin is warm and dry.     Capillary Refill: Capillary refill takes less than 2 seconds.  Coloration: Skin is not jaundiced or pale.     Findings: No bruising, erythema, lesion or rash.  Neurological:     General: No focal deficit present.     Mental Status: He is alert and oriented to person, place, and time.     Cranial Nerves: No cranial nerve deficit.     Sensory: No  sensory deficit.     Motor: No weakness.     Coordination: Coordination normal.     Gait: Gait normal.     Deep Tendon Reflexes: Reflexes normal.  Psychiatric:        Mood and Affect: Mood normal.        Behavior: Behavior normal.        Thought Content: Thought content normal.        Judgment: Judgment normal.       Assessment & Plan:  1. Routine general medical examination at a health care facility - Continue to work on weight loss through diet and exercise - Follow up in one year or sooner if needed - CBC with Differential/Platelet; Future - Hemoglobin A1c; Future - Lipid panel; Future - TSH; Future - CMP with eGFR([facility]); Future  2. Essential hypertension - Will add Norvasc to regimen.  - Follow up in one month or sooner if needed - CBC with Differential/Platelet; Future - Hemoglobin A1c; Future - Lipid panel; Future - TSH; Future - CMP with eGFR([facility]); Future - amLODipine (NORVASC) 5 MG tablet; Take 1 tablet (5 mg total) by mouth daily.  Dispense: 90 tablet; Refill: 3  3. Coronary artery disease involving native coronary artery of native heart without angina pectoris - Consider increase in statin  - Work on weight loss  - CBC with Differential/Platelet; Future - Hemoglobin A1c; Future - Lipid panel; Future - TSH; Future - CMP with eGFR([facility]); Future  4. Sleep apnea, unspecified type - Refuses CPAP   5. Gastroesophageal reflux disease, unspecified whether esophagitis present - Continue with Prilosec  - CBC with Differential/Platelet; Future - Hemoglobin A1c; Future - Lipid panel; Future - TSH; Future - CMP with eGFR([facility]); Future  6. Prostate cancer screening  - PSA; Future  7. Need for influenza vaccination  - Flu Vaccine QUAD High Dose([facility])  Dorothyann Peng

## 2020-01-28 ENCOUNTER — Telehealth: Payer: Self-pay | Admitting: *Deleted

## 2020-01-28 ENCOUNTER — Other Ambulatory Visit: Payer: Self-pay | Admitting: Adult Health

## 2020-01-28 DIAGNOSIS — R7989 Other specified abnormal findings of blood chemistry: Secondary | ICD-10-CM

## 2020-01-28 LAB — CBC WITH DIFFERENTIAL/PLATELET
Absolute Monocytes: 545 cells/uL (ref 200–950)
Basophils Absolute: 11 cells/uL (ref 0–200)
Basophils Relative: 0.1 %
Eosinophils Absolute: 0 cells/uL — ABNORMAL LOW (ref 15–500)
Eosinophils Relative: 0 %
HCT: 44.1 % (ref 38.5–50.0)
Hemoglobin: 15.2 g/dL (ref 13.2–17.1)
Lymphs Abs: 1232 cells/uL (ref 850–3900)
MCH: 32.8 pg (ref 27.0–33.0)
MCHC: 34.5 g/dL (ref 32.0–36.0)
MCV: 95 fL (ref 80.0–100.0)
MPV: 11.1 fL (ref 7.5–12.5)
Monocytes Relative: 5 %
Neutro Abs: 9112 cells/uL — ABNORMAL HIGH (ref 1500–7800)
Neutrophils Relative %: 83.6 %
Platelets: 280 10*3/uL (ref 140–400)
RBC: 4.64 10*6/uL (ref 4.20–5.80)
RDW: 12.4 % (ref 11.0–15.0)
Total Lymphocyte: 11.3 %
WBC: 10.9 10*3/uL — ABNORMAL HIGH (ref 3.8–10.8)

## 2020-01-28 LAB — LIPID PANEL
Cholesterol: 139 mg/dL (ref <200)
HDL: 51 mg/dL (ref >=40)
LDL Cholesterol (Calc): 71 mg/dL (calc)
Non-HDL Cholesterol (Calc): 88 mg/dL (calc) (ref <130)
Total CHOL/HDL Ratio: 2.7 (calc) (ref <5.0)
Triglycerides: 90 mg/dL (ref <150)

## 2020-01-28 LAB — COMPLETE METABOLIC PANEL WITH GFR
AG Ratio: 1.9 (calc) (ref 1.0–2.5)
ALT: 17 U/L (ref 9–46)
AST: 16 U/L (ref 10–35)
Albumin: 5 g/dL (ref 3.6–5.1)
Alkaline phosphatase (APISO): 95 U/L (ref 35–144)
BUN: 19 mg/dL (ref 7–25)
CO2: 27 mmol/L (ref 20–32)
Calcium: 10.6 mg/dL — ABNORMAL HIGH (ref 8.6–10.3)
Chloride: 104 mmol/L (ref 98–110)
Creat: 0.84 mg/dL (ref 0.70–1.25)
GFR, Est African American: 104 mL/min/{1.73_m2} (ref >=60)
GFR, Est Non African American: 89 mL/min/{1.73_m2} (ref >=60)
Globulin: 2.7 g/dL (calc) (ref 1.9–3.7)
Glucose, Bld: 121 mg/dL — ABNORMAL HIGH (ref 65–99)
Potassium: 4.8 mmol/L (ref 3.5–5.3)
Sodium: 141 mmol/L (ref 135–146)
Total Bilirubin: 0.6 mg/dL (ref 0.2–1.2)
Total Protein: 7.7 g/dL (ref 6.1–8.1)

## 2020-01-28 LAB — HEMOGLOBIN A1C
Hgb A1c MFr Bld: 5 % of total Hgb (ref <5.7)
Mean Plasma Glucose: 97 (calc)
eAG (mmol/L): 5.4 (calc)

## 2020-01-28 LAB — TSH: TSH: 0.3 mIU/L — ABNORMAL LOW (ref 0.40–4.50)

## 2020-01-28 LAB — PSA: PSA: 0.36 ng/mL (ref <=4.0)

## 2020-01-28 NOTE — Telephone Encounter (Signed)
Patient called stating he is returning a call.  

## 2020-01-29 ENCOUNTER — Telehealth: Payer: Self-pay | Admitting: Adult Health

## 2020-01-29 NOTE — Telephone Encounter (Signed)
Pt returned call for lab results. Please call pt on cell [phone] .

## 2020-01-29 NOTE — Telephone Encounter (Signed)
See results note. 

## 2020-02-02 ENCOUNTER — Other Ambulatory Visit: Payer: Self-pay | Admitting: Adult Health

## 2020-02-12 ENCOUNTER — Other Ambulatory Visit (INDEPENDENT_AMBULATORY_CARE_PROVIDER_SITE_OTHER): Payer: PPO

## 2020-02-12 ENCOUNTER — Other Ambulatory Visit: Payer: Self-pay

## 2020-02-12 DIAGNOSIS — R7989 Other specified abnormal findings of blood chemistry: Secondary | ICD-10-CM

## 2020-02-12 LAB — TSH: TSH: 1.27 u[IU]/mL (ref 0.35–4.50)

## 2020-02-12 NOTE — Addendum Note (Signed)
Addended by: Lerry Liner on: 02/12/2020 07:06 AM   Modules accepted: Orders

## 2020-02-18 DIAGNOSIS — M9905 Segmental and somatic dysfunction of pelvic region: Secondary | ICD-10-CM | POA: Diagnosis not present

## 2020-02-18 DIAGNOSIS — Q72891 Other reduction defects of right lower limb: Secondary | ICD-10-CM | POA: Diagnosis not present

## 2020-02-18 DIAGNOSIS — M5137 Other intervertebral disc degeneration, lumbosacral region: Secondary | ICD-10-CM | POA: Diagnosis not present

## 2020-02-18 DIAGNOSIS — M9904 Segmental and somatic dysfunction of sacral region: Secondary | ICD-10-CM | POA: Diagnosis not present

## 2020-02-18 DIAGNOSIS — M9903 Segmental and somatic dysfunction of lumbar region: Secondary | ICD-10-CM | POA: Diagnosis not present

## 2020-02-19 ENCOUNTER — Encounter: Payer: Self-pay | Admitting: Internal Medicine

## 2020-02-24 ENCOUNTER — Other Ambulatory Visit: Payer: Self-pay

## 2020-02-24 ENCOUNTER — Ambulatory Visit (INDEPENDENT_AMBULATORY_CARE_PROVIDER_SITE_OTHER): Payer: PPO | Admitting: Adult Health

## 2020-02-24 ENCOUNTER — Encounter: Payer: Self-pay | Admitting: Adult Health

## 2020-02-24 VITALS — BP 120/82 | HR 70 | Temp 98.8°F | Ht 67.99 in | Wt 262.6 lb

## 2020-02-24 DIAGNOSIS — I10 Essential (primary) hypertension: Secondary | ICD-10-CM

## 2020-02-24 NOTE — Progress Notes (Signed)
Subjective:    Patient ID: David Vincent, male    DOB: 17-Aug-1950, 69 y.o.   MRN: [id number]  HPI   69 year old male who  has a past medical history of BACK PAIN (03/10/2007), CAD (coronary artery disease), ED (erectile dysfunction), GERD (gastroesophageal reflux disease), History of mumps orchitis, HYPERTENSION (11/15/2006), INGUINAL HERNIA, RIGHT (12/05/2009), LEG CRAMPS (12/03/2008), SLEEP APNEA (11/16/2008), Unstable angina ([facility]), and VERTIGO, POSITIONAL (08/08/2009).  He presents to the office today for one month follow up regarding essential hypertension.  During his physical exam it was noted that his blood pressure was elevated.  Norvasc 5 mg was added to his regimen of Cozaar 100 mg daily.  He has been monitoring his blood pressure at home and reports readings in the low 130s over 80s.  He denies side effects of the medication.  In the office his blood pressure is 120/82   Review of Systems See HPI   Past Medical History:  Diagnosis Date   BACK PAIN 03/10/2007   CAD (coronary artery disease)    A.  01/05/2002 Cath - nonobs dzs (LAD 20prox, RI 30-40 prox)   ED (erectile dysfunction)    GERD (gastroesophageal reflux disease)    History of mumps orchitis    HYPERTENSION 11/15/2006   INGUINAL HERNIA, RIGHT 12/05/2009   LEG CRAMPS 12/03/2008   SLEEP APNEA 11/16/2008   Unstable angina ([facility])    VERTIGO, POSITIONAL 08/08/2009    Social History   Socioeconomic History   Marital status: Married    Spouse name: Not on file   Number of children: Not on file   Years of education: Not on file   Highest education level: Not on file  Occupational History   Not on file  Tobacco Use   Smoking status: Never Smoker   Smokeless tobacco: Never Used  Substance and Sexual Activity   Alcohol use: No   Drug use: No   Sexual activity: Not on file  Other Topics Concern   Not on file  Social History Narrative   05/02/2011:  Currently unemployed.  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  Does not exercise or adhere to any specific diet.   Social Determinants of Health   Financial Resource Strain: Low Risk    Difficulty of Paying Living Expenses: Not hard at all  Food Insecurity: No Food Insecurity   Worried About Programme researcher, broadcasting/film/video in the Last Year: Never true   Ran Out of Food in the Last Year: Never true  Transportation Needs: No Transportation Needs   Lack of Transportation (Medical): No   Lack of Transportation (Non-Medical): No  Physical Activity: Inactive   Days of Exercise per Week: 0 days   Minutes of Exercise per Session: 0 min  Stress: No Stress Concern Present   Feeling of Stress : Not at all  Social Connections: Moderately Isolated   Frequency of Communication with Friends and Family: More than three times a week   Frequency of Social Gatherings with Friends and Family: More than three times a week   Attends Religious Services: Never   Database administrator or Organizations: No   Attends Banker Meetings: Never   Marital Status: Married  Catering manager Violence: Not At Risk   Fear of Current or Ex-Partner: No   Emotionally Abused: No   Physically Abused: No   Sexually Abused: No    Past Surgical History:  Procedure Laterality Date  CARDIAC CATHETERIZATION     FOOT SURGERY Right 2019   KNEE ARTHROSCOPY     Right    Family History  Problem Relation Age of Onset   Pneumonia Mother        died 68 - hip fx/pna   Heart attack Father        died 8   Hypertension Sister        alive   Cancer Brother        died - agent orange exposure   Hypertension Brother        alive    Allergies  Allergen Reactions   Bee Venom Anaphylaxis   Gabapentin Other (See Comments)    Hallucinations    Codeine Phosphate Itching   Penicillins Other (See Comments)    Has patient had a PCN reaction causing immediate rash, facial/tongue/throat swelling, SOB or lightheadedness with  hypotension: Unknown, childhood reaction Has patient had a PCN reaction causing severe rash involving mucus membranes or skin necrosis: No Has patient had a PCN reaction that required hospitalization No Has patient had a PCN reaction occurring within the last 10 years: No If all of the above answers are "NO", then may proceed with Cephalosporin use.     Current Outpatient Medications on File Prior to Visit  Medication Sig Dispense Refill   acetaminophen (TYLENOL) 500 MG tablet Take 500 mg by mouth every 6 (six) hours as needed.     amLODipine (NORVASC) 5 MG tablet Take 1 tablet (5 mg total) by mouth daily. 90 tablet 3   atorvastatin (LIPITOR) 20 MG tablet Take 1 tablet by mouth once daily 90 tablet 3   EPINEPHrine 0.3 mg/0.3 mL IJ SOAJ injection Inject into the muscle.      ibuprofen (ADVIL) 600 MG tablet Take 1 tablet (600 mg total) by mouth every 6 (six) hours as needed. 30 tablet 0   losartan (COZAAR) 100 MG tablet Take 1 tablet by mouth once daily 90 tablet 0   meclizine (ANTIVERT) 25 MG tablet Take 1 tablet (25 mg total) by mouth 3 (three) times daily as needed for dizziness. 30 tablet 2   meloxicam (MOBIC) 15 MG tablet Take 15 mg by mouth daily.     methocarbamol (ROBAXIN) 500 MG tablet Take 500 mg by mouth 3 (three) times daily as needed.     mupirocin ointment (BACTROBAN) 2 % Apply 1 application topically 3 (three) times daily. 22 g 0   omeprazole (PRILOSEC) 20 MG capsule Take 1 capsule by mouth once daily in the morning 90 capsule 0   gabapentin (NEURONTIN) 300 MG capsule Take 1 capsule (300 mg total) by mouth 2 (two) times daily. (Patient taking differently: Take 300 mg by mouth at bedtime. ) 60 capsule 0   No current facility-administered medications on file prior to visit.    BP 120/82 (BP Location: Left Arm, Patient Position: Sitting)    Pulse 70    Temp 98.8 F (37.1 C)    Ht 5' 7.99" (1.727 m)    Wt 262 lb 9.6 oz (119.1 kg)    SpO2 96%    BMI 39.94 kg/m         Objective:   Physical Exam Vitals and nursing note reviewed.  Constitutional:      Appearance: Normal appearance.  Skin:    General: Skin is warm and dry.     Capillary Refill: Capillary refill takes less than 2 seconds.  Neurological:     General: No focal deficit present.  Mental Status: He is alert and oriented to person, place, and time.  Psychiatric:        Mood and Affect: Mood normal.        Behavior: Behavior normal.        Thought Content: Thought content normal.        Judgment: Judgment normal.       Assessment & Plan:  1. Essential hypertension - BP at goal.  -No change in medication.  Continue with Norvasc 5 mg and Cozaar 100 mg daily.  Follow-up as needed  [name], NP

## 2020-03-09 ENCOUNTER — Other Ambulatory Visit: Payer: Self-pay | Admitting: Adult Health

## 2020-03-25 ENCOUNTER — Telehealth: Payer: Self-pay | Admitting: Adult Health

## 2020-03-25 NOTE — Telephone Encounter (Signed)
Left a message for the pt to return my call.  

## 2020-03-25 NOTE — Telephone Encounter (Signed)
Pt has diarrhea  want to know what he can take

## 2020-03-25 NOTE — Telephone Encounter (Signed)
Patient called back and I advised he try taking Pepto or Imodium for diarrhea.

## 2020-03-26 ENCOUNTER — Other Ambulatory Visit: Payer: Self-pay

## 2020-03-26 ENCOUNTER — Encounter (HOSPITAL_COMMUNITY): Payer: Self-pay

## 2020-03-26 ENCOUNTER — Ambulatory Visit (HOSPITAL_COMMUNITY)
Admission: EM | Admit: 2020-03-26 | Discharge: 2020-03-26 | Disposition: A | Discharge status: Home / Self Care | Payer: PPO | Attending: Family Medicine | Admitting: Family Medicine

## 2020-03-26 DIAGNOSIS — R197 Diarrhea, unspecified: Secondary | ICD-10-CM | POA: Insufficient documentation

## 2020-03-26 DIAGNOSIS — R1084 Generalized abdominal pain: Secondary | ICD-10-CM | POA: Insufficient documentation

## 2020-03-26 LAB — CBC WITH DIFFERENTIAL/PLATELET
Abs Immature Granulocytes: 0.02 10*3/uL (ref 0.00–0.07)
Basophils Absolute: 0.1 10*3/uL (ref 0.0–0.1)
Basophils Relative: 1 %
Eosinophils Absolute: 0.2 10*3/uL (ref 0.0–0.5)
Eosinophils Relative: 3 %
HCT: 38.2 % — ABNORMAL LOW (ref 39.0–52.0)
Hemoglobin: 13.7 g/dL (ref 13.0–17.0)
Immature Granulocytes: 0 %
Lymphocytes Relative: 33 %
Lymphs Abs: 2.7 10*3/uL (ref 0.7–4.0)
MCH: 33.1 pg (ref 26.0–34.0)
MCHC: 35.9 g/dL (ref 30.0–36.0)
MCV: 92.3 fL (ref 80.0–100.0)
Monocytes Absolute: 0.8 10*3/uL (ref 0.1–1.0)
Monocytes Relative: 10 %
Neutro Abs: 4.2 10*3/uL (ref 1.7–7.7)
Neutrophils Relative %: 53 %
Platelets: 243 10*3/uL (ref 150–400)
RBC: 4.14 MIL/uL — ABNORMAL LOW (ref 4.22–5.81)
RDW: 13.3 % (ref 11.5–15.5)
WBC: 8 10*3/uL (ref 4.0–10.5)
nRBC: 0 % (ref 0.0–0.2)

## 2020-03-26 LAB — COMPREHENSIVE METABOLIC PANEL
ALT: 27 U/L (ref 0–44)
AST: 25 U/L (ref 15–41)
Albumin: 3.9 g/dL (ref 3.5–5.0)
Alkaline Phosphatase: 76 U/L (ref 38–126)
Anion gap: 9 (ref 5–15)
BUN: 14 mg/dL (ref 8–23)
CO2: 24 mmol/L (ref 22–32)
Calcium: 9.4 mg/dL (ref 8.9–10.3)
Chloride: 107 mmol/L (ref 98–111)
Creatinine, Ser: 0.86 mg/dL (ref 0.61–1.24)
GFR, Estimated: >60 mL/min (ref >60)
Glucose, Bld: 85 mg/dL (ref 70–99)
Potassium: 4.2 mmol/L (ref 3.5–5.1)
Sodium: 140 mmol/L (ref 135–145)
Total Bilirubin: 0.9 mg/dL (ref 0.3–1.2)
Total Protein: 7 g/dL (ref 6.5–8.1)

## 2020-03-26 LAB — LIPASE, BLOOD: Lipase: 25 U/L (ref 11–51)

## 2020-03-26 MED ORDER — LOPERAMIDE HCL 2 MG PO CAPS: 2.0000 mg | ORAL_CAPSULE | Freq: Four times a day (QID) | ORAL | 0 refills | Status: DC | PRN

## 2020-03-26 NOTE — ED Provider Notes (Signed)
[facility]    CSN: [id number] Arrival date & time: 03/26/20  1423      History   Chief Complaint Chief Complaint  Patient presents with  . Abdominal Pain  . Diarrhea    HPI David Vincent is a 69 y.o. male.   Patient presenting today with 1 week of generalized abdominal pain and watery diarrhea up to 10 times per day. He denies known fever, N/V, dizziness (beyond baseline vertigo sxs), syncope, CP, SOB, cough, congestion. Has been tolerating PO well but has decreased intake as this seems to spark further diarrhea episodes. Not trying anything for sxs. No recent travel, new foods, sick contacts with similar sxs, hx of chronic GI issues.      Past Medical History:  Diagnosis Date  . BACK PAIN 03/10/2007  . CAD (coronary artery disease)    A.  01/05/2002 Cath - nonobs dzs (LAD 20prox, RI 30-40 prox)  . ED (erectile dysfunction)   . GERD (gastroesophageal reflux disease)   . History of mumps orchitis   . HYPERTENSION 11/15/2006  . INGUINAL HERNIA, RIGHT 12/05/2009  . LEG CRAMPS 12/03/2008  . SLEEP APNEA 11/16/2008  . Unstable angina (HCC)   . VERTIGO, POSITIONAL 08/08/2009    Patient Active Problem List   Diagnosis Date Noted  . Low testosterone 08/24/2014  . Erectile dysfunction due to diseases classified elsewhere 08/24/2014  . Multiple fractures of ribs of left side 01/01/2012  . GERD (gastroesophageal reflux disease) 05/03/2011  . CAD (coronary artery disease)   . Unstable angina (HCC)   . INGUINAL HERNIA, RIGHT 12/05/2009  . VERTIGO, POSITIONAL 08/08/2009  . Sleep apnea 11/16/2008  . Essential hypertension 11/15/2006    Past Surgical History:  Procedure Laterality Date  . CARDIAC CATHETERIZATION    . FOOT SURGERY Right 2019  . KNEE ARTHROSCOPY     Right       Home Medications    Prior to Admission medications   Medication Sig Start Date End Date Taking? Authorizing Provider  acetaminophen (TYLENOL) 500 MG tablet Take 500 mg by mouth every 6  (six) hours as needed.    [provider]  amLODipine (NORVASC) 5 MG tablet Take 1 tablet (5 mg total) by mouth daily. 01/27/20   Nafziger, Kandee Keen, NP  atorvastatin (LIPITOR) 20 MG tablet Take 1 tablet by mouth once daily 03/10/20   Nafziger, Kandee Keen, NP  EPINEPHrine 0.3 mg/0.3 mL IJ SOAJ injection Inject into the muscle.  08/16/19   [provider]  gabapentin (NEURONTIN) 300 MG capsule Take 1 capsule (300 mg total) by mouth 2 (two) times daily. Patient taking differently: Take 300 mg by mouth at bedtime.  02/03/19 03/05/19  Nafziger, Kandee Keen, NP  ibuprofen (ADVIL) 600 MG tablet Take 1 tablet (600 mg total) by mouth every 6 (six) hours as needed. 08/16/19   Domenick Gong, MD  loperamide (IMODIUM) 2 MG capsule Take 1 capsule (2 mg total) by mouth 4 (four) times daily as needed for diarrhea or loose stools. 03/26/20   Particia Nearing, PA-C  losartan (COZAAR) 100 MG tablet Take 1 tablet by mouth once daily 01/18/20   Nafziger, Kandee Keen, NP  meclizine (ANTIVERT) 25 MG tablet Take 1 tablet (25 mg total) by mouth 3 (three) times daily as needed for dizziness. 01/27/20   Nafziger, Kandee Keen, NP  meloxicam (MOBIC) 15 MG tablet Take 15 mg by mouth daily. 01/05/20   [provider]  methocarbamol (ROBAXIN) 500 MG tablet Take 500 mg by mouth 3 (three)  times daily as needed. 01/05/20   [provider]  mupirocin ointment (BACTROBAN) 2 % Apply 1 application topically 3 (three) times daily. 08/16/19   Domenick Gong, MD  omeprazole (PRILOSEC) 20 MG capsule Take 1 capsule by mouth once daily in the morning 02/03/20   [name], NP    Family History Family History  Problem Relation Age of Onset  . Pneumonia Mother        died 56 - hip fx/pna  . Heart attack Father        died 60  . Hypertension Sister        alive  . Cancer Brother        died - agent orange exposure  . Hypertension Brother        alive    Social History Social History   Tobacco Use  . Smoking status:  Never Smoker  . Smokeless tobacco: Never Used  Substance Use Topics  . Alcohol use: No  . Drug use: No     Allergies   Bee venom, Gabapentin, Codeine phosphate, and Penicillins   Review of Systems Review of Systems PER HPI   Physical Exam Triage Vital Signs ED Triage Vitals  Enc Vitals Group     BP 03/26/20 1435 (!) 106/56     Pulse Rate 03/26/20 1435 61     Resp 03/26/20 1435 (!) 25     Temp 03/26/20 1435 98.3 F (36.8 C)     Temp Source 03/26/20 1435 Oral     SpO2 03/26/20 1435 99 %     Weight 03/26/20 1433 256 lb 9.6 oz (116.4 kg)     Height --      Head Circumference --      Peak Flow --      Pain Score 03/26/20 1433 6     Pain Loc --      Pain Edu? --      Excl. in GC? --    Orthostatic VS for the past 24 hrs:  BP- Lying Pulse- Lying BP- Sitting Pulse- Sitting BP- Standing at 0 minutes Pulse- Standing at 0 minutes  03/26/20 1645 113/71 60 108/72 61 120/76 73    Updated Vital Signs BP (!) 106/56 (BP Location: Right Arm)   Pulse 61   Temp 98.3 F (36.8 C) (Oral)   Resp (!) 25   Wt 256 lb 9.6 oz (116.4 kg)   SpO2 99%   BMI 39.03 kg/m   Visual Acuity Right Eye Distance:   Left Eye Distance:   Bilateral Distance:    Right Eye Near:   Left Eye Near:    Bilateral Near:     Physical Exam Vitals and nursing note reviewed.  Constitutional:      Appearance: Normal appearance.  HENT:     Head: Atraumatic.     Mouth/Throat:     Mouth: Mucous membranes are moist.     Pharynx: Oropharynx is clear.  Eyes:     Extraocular Movements: Extraocular movements intact.     Conjunctiva/sclera: Conjunctivae normal.  Cardiovascular:     Rate and Rhythm: Normal rate and regular rhythm.     Heart sounds: Normal heart sounds.  Pulmonary:     Effort: Pulmonary effort is normal.     Breath sounds: Normal breath sounds.  Abdominal:     General: There is no distension.     Palpations: Abdomen is soft. There is no mass.     Tenderness: There is abdominal  tenderness (mild  LLQ ttp). There is no right CVA tenderness, left CVA tenderness, guarding or rebound.     Hernia: No hernia is present.     Comments: BS hyperactive   Musculoskeletal:        General: Normal range of motion.     Cervical back: Normal range of motion and neck supple.  Skin:    General: Skin is warm and dry.  Neurological:     General: No focal deficit present.     Mental Status: He is oriented to person, place, and time.     Motor: No weakness.     Gait: Gait normal.  Psychiatric:        Mood and Affect: Mood normal.        Thought Content: Thought content normal.        Judgment: Judgment normal.       UC Treatments / Results  Labs (all labs ordered are listed, but only abnormal results are displayed) Labs Reviewed  CBC WITH DIFFERENTIAL/PLATELET - Abnormal; Notable for the following components:      Result Value   RBC 4.14 (*)    HCT 38.2 (*)    All other components within normal limits  C DIFFICILE QUICK SCREEN W PCR REFLEX  GASTROINTESTINAL PANEL BY PCR, STOOL (REPLACES STOOL CULTURE)  COMPREHENSIVE METABOLIC PANEL  LIPASE, BLOOD    EKG   Radiology No results found.  Procedures Procedures (including critical care time)  Medications Ordered in UC Medications - No data to display  Initial Impression / Assessment and Plan / UC Course  I have reviewed the triage vital signs and the nursing notes.  Pertinent labs & imaging results that were available during my care of the patient were reviewed by me and considered in my medical decision making (see chart for details).     Mildly hypotensive in triage, but vitals improved on recheck/orthostatic VSs which were benign. He is overall well appearing with fairly reassuring exam aside from hyperactive BSs and mild LLQ ttp. Suspect viral illness, but cannot r/o bacterial, diverticulitis or other more serious condition at this time. Labs, stool studies pending, strict ED precautions given. He does not  wish to go to ED at this time but agreeable to going if sxs worsening. Imodium, fluids, brat diet. He will also f/u with PCP for recheck next week.   Final Clinical Impressions(s) / UC Diagnoses   Final diagnoses:  Diarrhea, unspecified type  Generalized abdominal pain   Discharge Instructions   None    ED Prescriptions    Medication Sig Dispense Auth. Provider   loperamide (IMODIUM) 2 MG capsule Take 1 capsule (2 mg total) by mouth 4 (four) times daily as needed for diarrhea or loose stools. 12 capsule Particia Nearing, [place]     PDMP not reviewed this encounter.   Particia Nearing, [place] 03/26/20 1707

## 2020-03-26 NOTE — ED Triage Notes (Signed)
Pt presents with abdominal pain and diarrhea x 1 week. States he has over 10 loose stools every day. Reports he called his  PCP yesterday and was instructed to take Imodium, pt states he did no started it as he is being taking OT to stopped the diarrhea and is not working. Denies fever, nausea, chest pain, sob.  Pt reports lost 15 pounds this week. 256.6 lbs today.

## 2020-04-11 ENCOUNTER — Other Ambulatory Visit: Payer: Self-pay | Admitting: Adult Health

## 2020-04-12 ENCOUNTER — Other Ambulatory Visit: Payer: Self-pay

## 2020-04-12 ENCOUNTER — Ambulatory Visit (AMBULATORY_SURGERY_CENTER): Payer: Self-pay | Admitting: *Deleted

## 2020-04-12 VITALS — Ht 68.0 in | Wt 256.0 lb

## 2020-04-12 DIAGNOSIS — Z1211 Encounter for screening for malignant neoplasm of colon: Secondary | ICD-10-CM

## 2020-04-12 MED ORDER — SUTAB 1479-225-188 MG PO TABS: 24.0000 | ORAL_TABLET | ORAL | 0 refills | Status: DC

## 2020-04-12 NOTE — Progress Notes (Signed)
No egg or soy allergy known to patient  No issues with past sedation with any surgeries or procedures No intubation problems in the past  No FH of Malignant Hyperthermia No diet pills per patient No home 02 use per patient  No blood thinners per patient  Pt denies issues with constipation  No A fib or A flutter  EMMI video to pt or via MyChart  COVID 19 guidelines implemented in PV today with Pt and RN  Pt is fully vaccinated  for [employer] given to pt in PV today , Code to Pharmacy   Due to the COVID-19 pandemic we are asking patients to follow certain guidelines.  Pt aware of COVID protocols and [facility] guidelines   Pt verified name, DOB, address and insurance during PV today. Pt mailed instruction packet to included paper to complete and mail back to [facility] with addressed and stamped envelope, Emmi video, copy of consent form to read and not return, and instructions. [employer]  coupon mailed in packet. PV completed over the phone. Pt encouraged to call with questions or issues

## 2020-04-26 ENCOUNTER — Encounter: Payer: PPO | Admitting: Internal Medicine

## 2020-04-30 ENCOUNTER — Other Ambulatory Visit: Payer: Self-pay | Admitting: Adult Health

## 2020-05-03 NOTE — Telephone Encounter (Signed)
Sent to the pharmacy by e-scribe. 

## 2020-05-17 ENCOUNTER — Encounter: Payer: Self-pay | Admitting: Internal Medicine

## 2020-06-27 IMAGING — DX DG CHEST 2V
2 series · 2 of 2 positions shown · non-contrast
Comparison: 04/03/2018

CLINICAL DATA: Chest pain

EXAM:
CHEST - 2 VIEW

[chest pa]
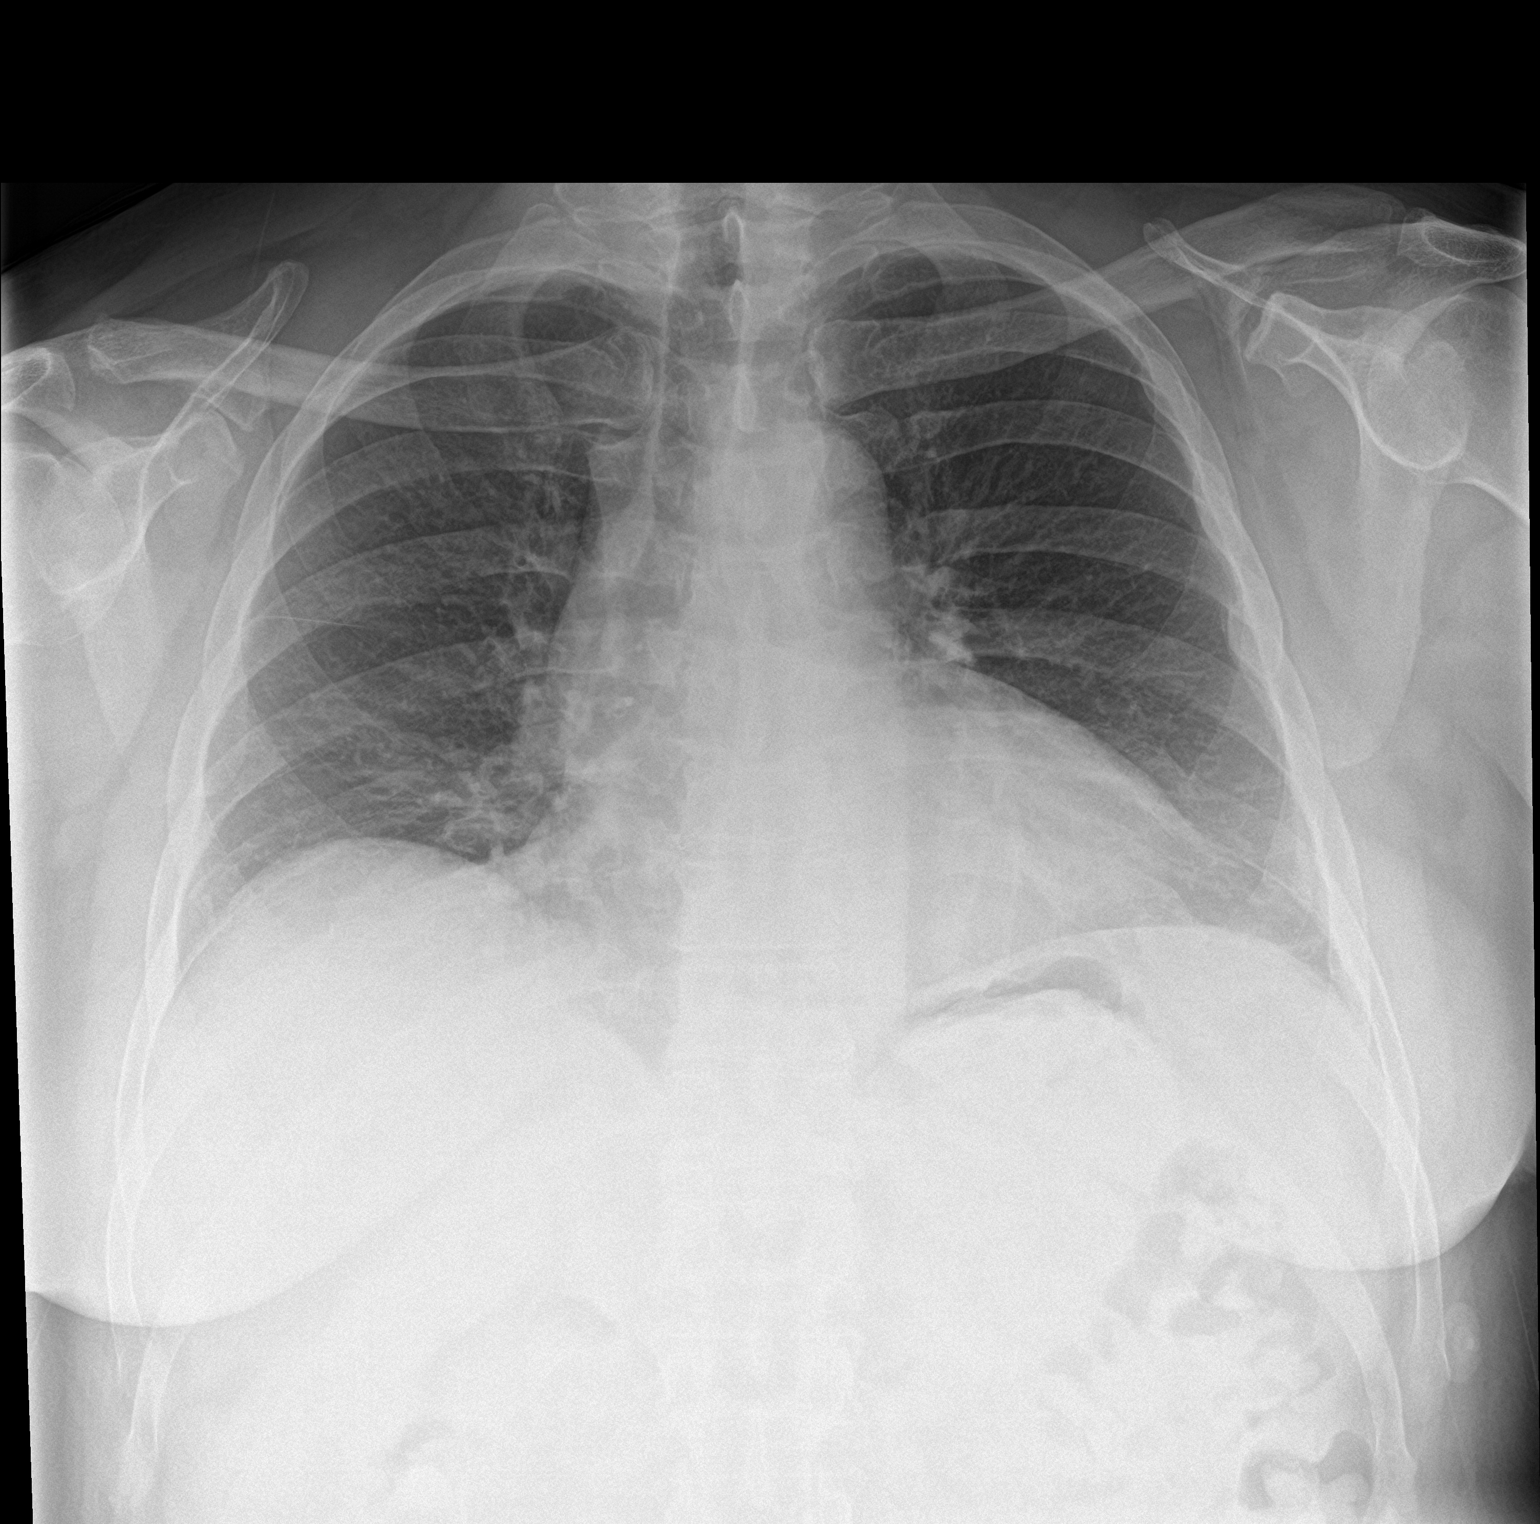

[chest lat]
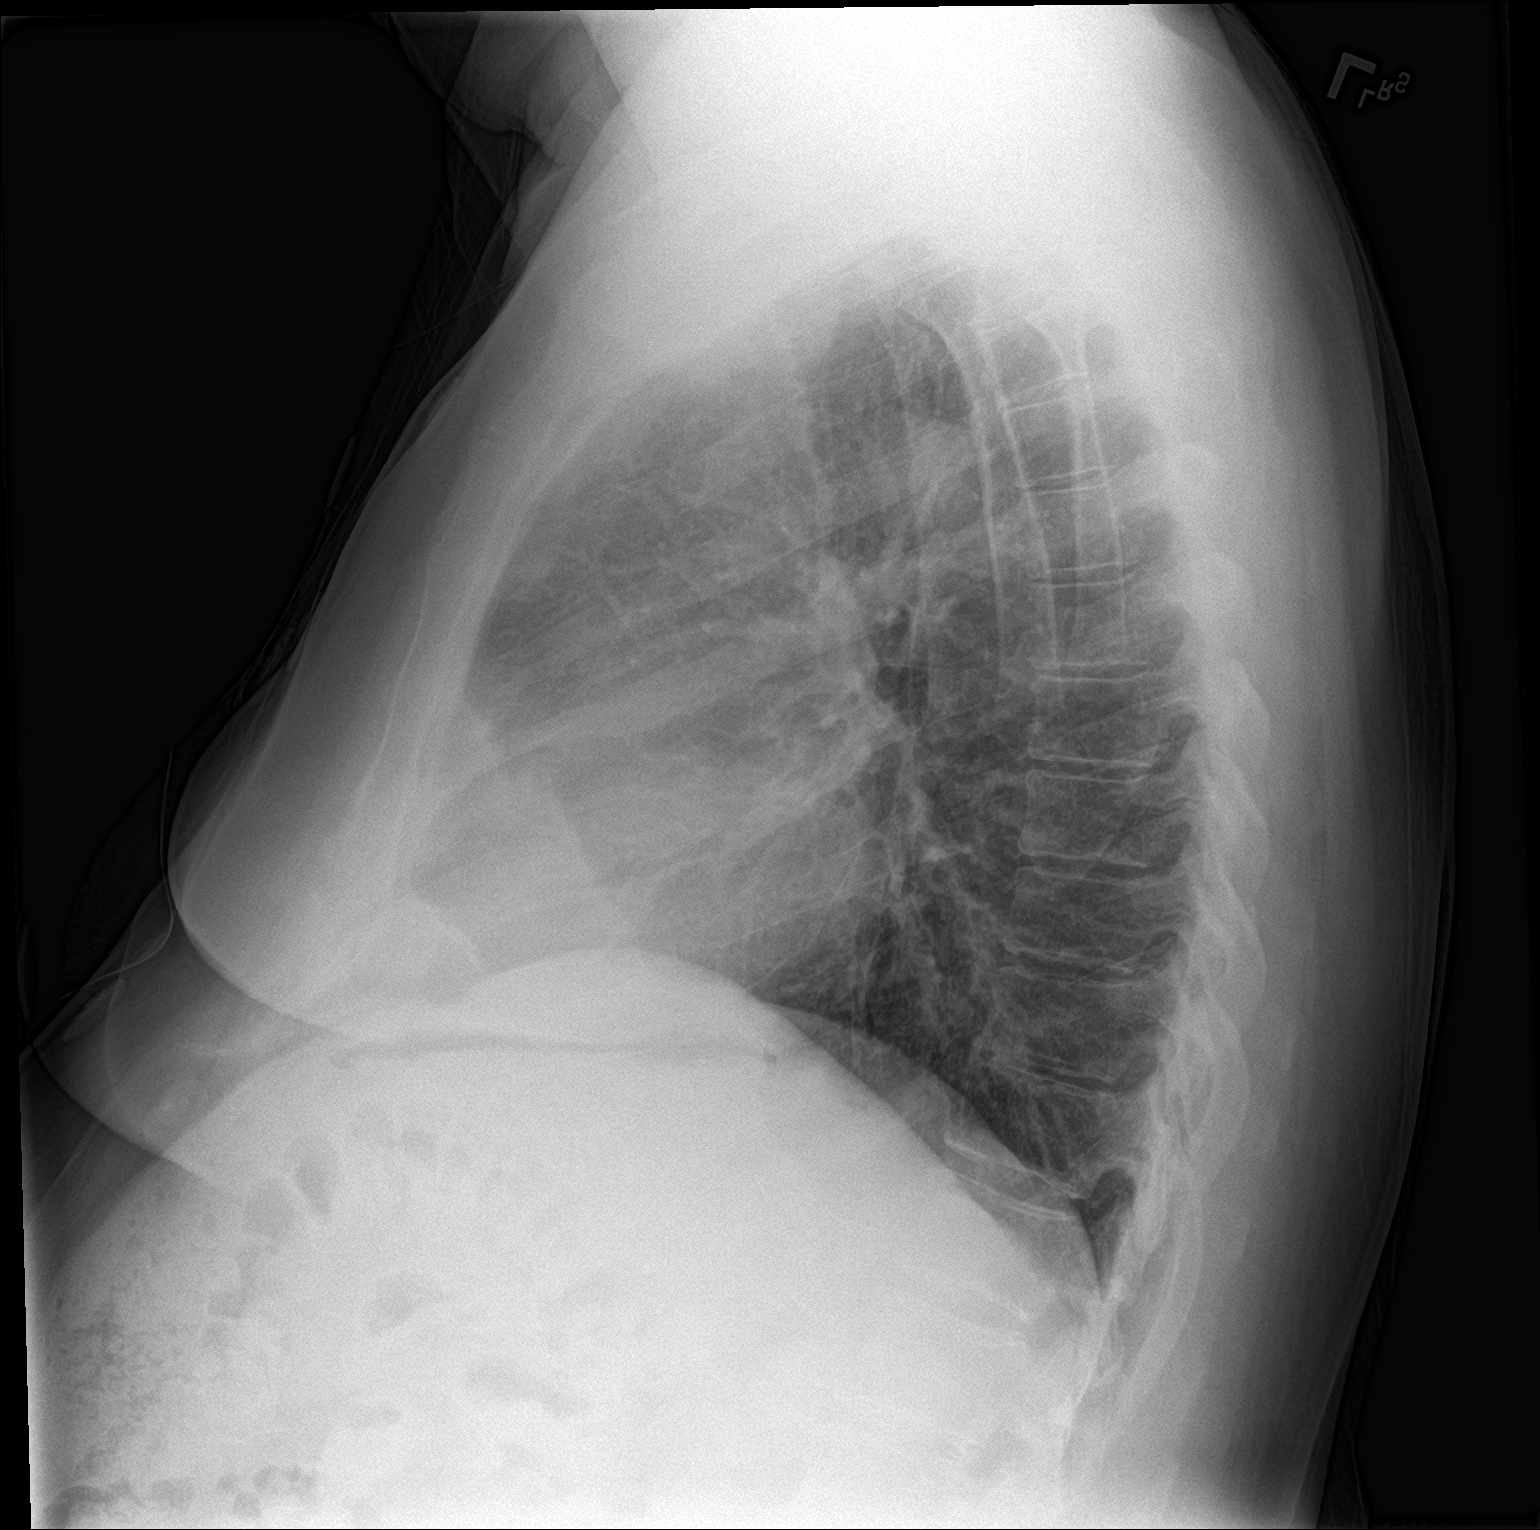

[2 of 2 positions shown; findings below may reference images not displayed]

FINDINGS: Low lung volumes. Heart and mediastinal contours are within normal
limits. No focal opacities or effusions. No acute bony abnormality.
IMPRESSION: Low volumes.  No active cardiopulmonary disease.

## 2020-09-04 ENCOUNTER — Other Ambulatory Visit: Payer: Self-pay | Admitting: Adult Health

## 2020-09-12 DIAGNOSIS — M25562 Pain in left knee: Secondary | ICD-10-CM | POA: Diagnosis not present

## 2020-09-12 DIAGNOSIS — M25561 Pain in right knee: Secondary | ICD-10-CM | POA: Diagnosis not present

## 2020-09-12 DIAGNOSIS — M7051 Other bursitis of knee, right knee: Secondary | ICD-10-CM | POA: Diagnosis not present

## 2020-09-30 ENCOUNTER — Other Ambulatory Visit: Payer: Self-pay

## 2020-09-30 ENCOUNTER — Encounter: Payer: Self-pay | Admitting: Family Medicine

## 2020-09-30 ENCOUNTER — Telehealth: Payer: Self-pay | Admitting: Adult Health

## 2020-09-30 ENCOUNTER — Ambulatory Visit (INDEPENDENT_AMBULATORY_CARE_PROVIDER_SITE_OTHER): Payer: PPO | Admitting: Family Medicine

## 2020-09-30 VITALS — BP 148/88 | HR 77 | Temp 97.6°F | Ht 68.0 in | Wt 260.0 lb

## 2020-09-30 DIAGNOSIS — Z1211 Encounter for screening for malignant neoplasm of colon: Secondary | ICD-10-CM

## 2020-09-30 DIAGNOSIS — M5442 Lumbago with sciatica, left side: Secondary | ICD-10-CM | POA: Diagnosis not present

## 2020-09-30 MED ORDER — PREDNISONE 20 MG PO TABS: ORAL_TABLET | ORAL | 0 refills | Status: DC

## 2020-09-30 MED ORDER — EPINEPHRINE 0.3 MG/0.3ML IJ SOAJ: 0.3000 mg | INTRAMUSCULAR | 5 refills | Status: DC | PRN

## 2020-09-30 MED ORDER — METHOCARBAMOL 500 MG PO TABS: 500.0000 mg | ORAL_TABLET | Freq: Every evening | ORAL | 0 refills | Status: DC | PRN

## 2020-09-30 NOTE — Telephone Encounter (Addendum)
Referral has been placed! Pt notified of update

## 2020-09-30 NOTE — Progress Notes (Signed)
Martha Clan DOB: 03/31/1951 Encounter date: 09/30/2020  This is a 70 y.o. male who presents with Chief Complaint  Patient presents with   Leg Pain    Shooting pain down left leg sx 1 week.  Pt fell a month ago and hit knee. Pt got Xray     History of present illness:  Not shocking pain in leg; worse in mornings and getting up at night.   1 month ago fell on concrete on knees. Saw [facility] and said that there was bruising but xrays were otherwise ok. Knees have been throbbing some. They did tell him they may have to pull out fluid. Tried to ice knees. Shooting pain is felt in left ankle and up back of legs; stinging.   Long time ago working in [employer] and stood for work on concrete - had other foot issues at that time (plantar fasciitis).   Does have back pain - fell a few years ago and broke a few ribs in back. Has had pain since then. Left  lower ribs bother him. Doesn't feel restricted with ROM.   Left leg doesn't bother as much with standing prolonged periods. No swelling in left leg.   Hasn't noted change in "puffiness" of knees.   Has not had imaging on lower back or hips  Allergies  Allergen Reactions   Bee Venom Anaphylaxis   Gabapentin Other (See Comments)    Hallucinations    Codeine Phosphate Itching   Penicillins Other (See Comments)    Has patient had a PCN reaction causing immediate rash, facial/tongue/throat swelling, SOB or lightheadedness with hypotension: Unknown, childhood reaction Has patient had a PCN reaction causing severe rash involving mucus membranes or skin necrosis: No Has patient had a PCN reaction that required hospitalization No Has patient had a PCN reaction occurring within the last 10 years: No If all of the above answers are "NO", then may proceed with Cephalosporin use.    Current Meds  Medication Sig   acetaminophen (TYLENOL) 500 MG tablet Take 500 mg by mouth every 6 (six) hours as needed.   amLODipine (NORVASC) 5 MG  tablet Take 1 tablet (5 mg total) by mouth daily.   atorvastatin (LIPITOR) 20 MG tablet Take 1 tablet by mouth once daily   EPINEPHrine 0.3 mg/0.3 mL IJ SOAJ injection Inject into the muscle.    ibuprofen (ADVIL) 600 MG tablet Take 1 tablet (600 mg total) by mouth every 6 (six) hours as needed.   loperamide (IMODIUM) 2 MG capsule Take 1 capsule (2 mg total) by mouth 4 (four) times daily as needed for diarrhea or loose stools.   losartan (COZAAR) 100 MG tablet TAKE 1 TABLET BY MOUTH ONCE DAILY   meclizine (ANTIVERT) 25 MG tablet Take 1 tablet (25 mg total) by mouth 3 (three) times daily as needed for dizziness.   meloxicam (MOBIC) 15 MG tablet Take 15 mg by mouth daily.   methocarbamol (ROBAXIN) 500 MG tablet Take 500 mg by mouth 3 (three) times daily as needed.   mupirocin ointment (BACTROBAN) 2 % Apply 1 application topically 3 (three) times daily.   omeprazole (PRILOSEC) 20 MG capsule Take 1 capsule by mouth once daily in the morning   Sodium Sulfate-Mag Sulfate-KCl (SUTAB) [phone] MG TABS Take 24 tablets by mouth as directed. [employer]!! BIN: [id number] PCN: CN GROUP: [id number] MEMBER ID: [id number];XKP AS SECONDARY INSURANCE ;NO PRIOR AUTHORIZATION    Review of Systems  Constitutional:  Negative for chills, fatigue and fever.  Respiratory:  Negative for cough, chest tightness, shortness of breath and wheezing.   Cardiovascular:  Negative for chest pain, palpitations and leg swelling.  Musculoskeletal:  Positive for back pain (with radicular leg pain).  Neurological:  Negative for weakness.   Objective:  BP (!) 148/88   Pulse 77   Temp 97.6 F (36.4 C) (Oral)   Ht 5\' 8"  (1.727 m)   Wt 260 lb (117.9 kg)   SpO2 97%   BMI 39.53 kg/m   Weight: 260 lb (117.9 kg)   BP Readings from Last 3 Encounters:  09/30/20 (!) 148/88  03/26/20 (!) 106/56  02/24/20 120/82   Wt Readings from Last 3 Encounters:  09/30/20 260 lb (117.9 kg)  04/12/20 256 lb (116.1 kg)  03/26/20 256 lb  9.6 oz (116.4 kg)    Physical Exam Constitutional:      General: He is not in acute distress.    Appearance: He is well-developed.  Cardiovascular:     Rate and Rhythm: Normal rate and regular rhythm.     Heart sounds: Normal heart sounds. No murmur heard.   No friction rub.  Pulmonary:     Effort: Pulmonary effort is normal. No respiratory distress.     Breath sounds: Normal breath sounds. No wheezing or rales.  Musculoskeletal:     Right lower leg: No edema.     Left lower leg: No edema.     Comments: Grinding left hip with extension/flexion back. Pain bilat with internal and external hip rotation. Pain with palpation lower lumbar spine - spasm/pain SI left. Normal bilat LE reflexes. Negative straight leg test. Mild tenderness plantar fascia left.   Neurological:     Mental Status: He is alert and oriented to person, place, and time.  Psychiatric:        Behavior: Behavior normal.    Assessment/Plan  1. Acute midline low back pain with left-sided sciatica Discussed stretches, prednisone burst for inflammation. Imaging hips/lower back if not improving. Does have ortho he can follow up with.   Stretching exercises given. Discussed stretching foot as well for fasciitis.  Interested in weight loss. Discussed my fitness pal with him and encouraged looking at calorie intake with follow up suggested with PCP for other weight loss ideas after this.   - methocarbamol (ROBAXIN) 500 MG tablet; Take 1 tablet (500 mg total) by mouth at bedtime as needed.  Dispense: 30 tablet; Refill: 0 - predniSONE (DELTASONE) 20 MG tablet; Take 3 tablets PO daily x 3 days, then 2 tablets PO daily x 4 days, then 1 tablet PO daily x 3 days, then 1/2 tablet PO daily x 2 days.  Dispense: 21 tablet; Refill: 0  Return if symptoms worsen or fail to improve.   40 minutes spent in exam, discussion of treatment plan, charting, follow up exercise plan.     03/28/20, MD

## 2020-09-30 NOTE — Patient Instructions (Addendum)
*  no "strengthening exercises" for now; just stretches that are on hand out.   *I would suggest "my fitness pal" app to track your calories. 1500-1800 calories/day would be weight loss goal level. DON'T add back in what you loose for exercise.   *let me know if your pain is not better after treatment. Take the prednisone as directed with food; work on daily stretches and muscle relaxer (robaxin at bedtime).

## 2020-09-30 NOTE — Telephone Encounter (Signed)
Patient had a referral placed for a colonoscopy but had to cancel appointment due to wife having surgery.  Patient would like another referral placed because now he is able to complete procedure.  Please advise.

## 2020-11-25 ENCOUNTER — Ambulatory Visit (HOSPITAL_COMMUNITY)
Admission: EM | Admit: 2020-11-25 | Discharge: 2020-11-25 | Disposition: A | Discharge status: Home / Self Care | Payer: PPO | Attending: Internal Medicine | Admitting: Internal Medicine

## 2020-11-25 ENCOUNTER — Other Ambulatory Visit: Payer: Self-pay

## 2020-11-25 ENCOUNTER — Encounter (HOSPITAL_COMMUNITY): Payer: Self-pay

## 2020-11-25 DIAGNOSIS — M549 Dorsalgia, unspecified: Secondary | ICD-10-CM

## 2020-11-25 DIAGNOSIS — M5442 Lumbago with sciatica, left side: Secondary | ICD-10-CM

## 2020-11-25 LAB — CBG MONITORING, ED: Glucose-Capillary: 105 mg/dL — ABNORMAL HIGH (ref 70–99)

## 2020-11-25 MED ORDER — METHOCARBAMOL 500 MG PO TABS: 500.0000 mg | ORAL_TABLET | Freq: Every evening | ORAL | 0 refills | Status: DC | PRN

## 2020-11-25 MED ORDER — IBUPROFEN 600 MG PO TABS: 600.0000 mg | ORAL_TABLET | Freq: Four times a day (QID) | ORAL | 0 refills | Status: DC | PRN

## 2020-11-25 NOTE — Discharge Instructions (Signed)
Gentle range of motion exercises Please take medications as prescribed Heating pad use on the 20 minutes on-20 minutes off cycle x 4 cycles daily No indication for x-rays Return to urgent care if you have any worsening symptoms.

## 2020-11-25 NOTE — ED Triage Notes (Addendum)
Pt states he was helping someone move and is having some rib pain and back pain, SOB at times. States he had broken ribs in the past and the pain feels the same.  Started: 6 days ago

## 2020-11-28 NOTE — ED Provider Notes (Signed)
[facility]    CSN: [id number] Arrival date & time: 11/25/20  1039      History   Chief Complaint Chief Complaint  Patient presents with   Back Pain   Rib cage pain    HPI David Vincent is a 70 y.o. male comes to the urgent care with 1 week history of mid to lower back pain and right rib pain which started a week ago.  Patient helped his son to move to another residence.  Patient endorses lifting several heavy objects.  He denies any falls or trauma to the back.  Back pain is currently of moderate severity, throbbing, aggravated by movement and no known relieving factors.  Patient also complains of right rib pain which is worse on taking deep inspiration.  It started around the same time as the back pain started.  No long distance travel.  No calf pain or tenderness.  No charley horses.Marland Kitchen   HPI  Past Medical History:  Diagnosis Date   BACK PAIN 03/10/2007   CAD (coronary artery disease)    A.  01/05/2002 Cath - nonobs dzs (LAD 20prox, RI 30-40 prox)   ED (erectile dysfunction)    GERD (gastroesophageal reflux disease)    History of mumps orchitis    Hyperlipidemia    HYPERTENSION 11/15/2006   INGUINAL HERNIA, RIGHT 12/05/2009   LEG CRAMPS 12/03/2008   SLEEP APNEA 11/16/2008   Sleep apnea    no cpap    Unstable angina (HCC)    VERTIGO, POSITIONAL 08/08/2009    Patient Active Problem List   Diagnosis Date Noted   Low testosterone 08/24/2014   Erectile dysfunction due to diseases classified elsewhere 08/24/2014   Multiple fractures of ribs of left side 01/01/2012   GERD (gastroesophageal reflux disease) 05/03/2011   CAD (coronary artery disease)    Unstable angina (HCC)    INGUINAL HERNIA, RIGHT 12/05/2009   VERTIGO, POSITIONAL 08/08/2009   Sleep apnea 11/16/2008   Essential hypertension 11/15/2006    Past Surgical History:  Procedure Laterality Date   CARDIAC CATHETERIZATION     COLONOSCOPY     FOOT SURGERY Right 2019   KNEE ARTHROSCOPY     Right    SHOULDER SURGERY Bilateral        Home Medications    Prior to Admission medications   Medication Sig Start Date End Date Taking? Authorizing Provider  amLODipine (NORVASC) 5 MG tablet Take 1 tablet (5 mg total) by mouth daily. 01/27/20  Yes Nafziger, Kandee Keen, NP  atorvastatin (LIPITOR) 20 MG tablet Take 1 tablet by mouth once daily 09/06/20  Yes Nafziger, Kandee Keen, NP  ibuprofen (ADVIL) 600 MG tablet Take 1 tablet (600 mg total) by mouth every 6 (six) hours as needed. 11/25/20  Yes Dmitriy Gair, Britta Mccreedy, MD  losartan (COZAAR) 100 MG tablet TAKE 1 TABLET BY MOUTH ONCE DAILY 04/12/20  Yes Nafziger, Kandee Keen, NP  acetaminophen (TYLENOL) 500 MG tablet Take 500 mg by mouth every 6 (six) hours as needed.    [provider]  EPINEPHrine 0.3 mg/0.3 mL IJ SOAJ injection Inject 0.3 mg into the muscle as needed for anaphylaxis. 09/30/20   Wynn Banker, MD  loperamide (IMODIUM) 2 MG capsule Take 1 capsule (2 mg total) by mouth 4 (four) times daily as needed for diarrhea or loose stools. 03/26/20   Particia Nearing, PA-C  meclizine (ANTIVERT) 25 MG tablet Take 1 tablet (25 mg total) by mouth 3 (three) times daily as needed for dizziness. 01/27/20  Nafziger, Kandee Keen, NP  methocarbamol (ROBAXIN) 500 MG tablet Take 1 tablet (500 mg total) by mouth at bedtime as needed. 11/25/20   Melburn Treiber, Britta Mccreedy, MD  mupirocin ointment (BACTROBAN) 2 % Apply 1 application topically 3 (three) times daily. 08/16/19   Domenick Gong, MD  omeprazole (PRILOSEC) 20 MG capsule Take 1 capsule by mouth once daily in the morning 05/03/20   Nafziger, Kandee Keen, NP  predniSONE (DELTASONE) 20 MG tablet Take 3 tablets PO daily x 3 days, then 2 tablets PO daily x 4 days, then 1 tablet PO daily x 3 days, then 1/2 tablet PO daily x 2 days. 09/30/20   Wynn Banker, MD  Sodium Sulfate-Mag Sulfate-KCl (SUTAB) [phone] MG TABS Take 24 tablets by mouth as directed. MANUFACTURER CODES!! BIN: [id number] PCN: CN GROUP: [id number] MEMBER ID:  [id number];[employer] AS SECONDARY INSURANCE ;NO PRIOR AUTHORIZATION 04/12/20   Hilarie Fredrickson, MD    Family History Family History  Problem Relation Age of Onset   Pneumonia Mother        died 55 - hip fx/pna   Heart attack Father        died 3   Hypertension Sister        alive   Cancer Brother        died - agent orange exposure   Hypertension Brother        alive   Colon cancer Neg Hx    Colon polyps Neg Hx    Esophageal cancer Neg Hx    Rectal cancer Neg Hx    Stomach cancer Neg Hx     Social History Social History   Tobacco Use   Smoking status: Never   Smokeless tobacco: Never  Substance Use Topics   Alcohol use: No   Drug use: No     Allergies   Bee venom, Gabapentin, Codeine phosphate, and Penicillins   Review of Systems Review of Systems  Constitutional: Negative.   HENT: Negative.    Respiratory: Negative.    Cardiovascular:  Positive for chest pain.  Gastrointestinal:  Negative for abdominal pain.  Genitourinary: Negative.   Musculoskeletal:  Positive for back pain. Negative for arthralgias, joint swelling and neck pain.  Neurological: Negative.     Physical Exam Triage Vital Signs ED Triage Vitals  Enc Vitals Group     BP 11/25/20 1158 134/68     Pulse Rate 11/25/20 1158 62     Resp 11/25/20 1158 18     Temp 11/25/20 1158 98.9 F (37.2 C)     Temp Source 11/25/20 1158 Oral     SpO2 11/25/20 1158 97 %     Weight --      Height --      Head Circumference --      Peak Flow --      Pain Score 11/25/20 1157 5     Pain Loc --      Pain Edu? --      Excl. in GC? --    No data found.  Updated Vital Signs BP 134/68 (BP Location: Left Arm)   Pulse 62   Temp 98.9 F (37.2 C) (Oral)   Resp 18   SpO2 97%   Visual Acuity Right Eye Distance:   Left Eye Distance:   Bilateral Distance:    Right Eye Near:   Left Eye Near:    Bilateral Near:     Physical Exam Vitals and nursing note reviewed.  Constitutional:      General:  He is not in  acute distress.    Appearance: He is not ill-appearing.  Cardiovascular:     Rate and Rhythm: Normal rate and regular rhythm.     Pulses: Normal pulses.     Heart sounds: Normal heart sounds.  Pulmonary:     Effort: Pulmonary effort is normal.     Breath sounds: Normal breath sounds.  Abdominal:     General: Bowel sounds are normal.     Palpations: Abdomen is soft.  Musculoskeletal:        General: No swelling, tenderness or deformity. Normal range of motion.  Skin:    General: Skin is warm.     Findings: No bruising.  Neurological:     Mental Status: He is alert.     UC Treatments / Results  Labs (all labs ordered are listed, but only abnormal results are displayed) Labs Reviewed  CBG MONITORING, ED - Abnormal; Notable for the following components:      Result Value   Glucose-Capillary 105 (*)    All other components within normal limits    EKG   Radiology No results found.  Procedures Procedures (including critical care time)  Medications Ordered in UC Medications - No data to display  Initial Impression / Assessment and Plan / UC Course  I have reviewed the triage vital signs and the nursing notes.  Pertinent labs & imaging results that were available during my care of the patient were reviewed by me and considered in my medical decision making (see chart for details).     1.  Acute back pain This is related to the lifting of heavy objects Robaxin 500 mg at bedtime as needed for back tightness or spasms Ibuprofen 600 mg every 6 hours as needed for pain Gentle range of motion exercises Heating pad use will help with back discomfort. No indication for x-rays at this time  2.  Right-sided chest pain this is likely pleuritic Wells score is low for pulmonary embolism. Patient complained of some dizziness and sweatiness which was intermittent and wanted his blood sugars to be checked.  Blood glucose was 105. Return precautions given. Final Clinical  Impressions(s) / UC Diagnoses   Final diagnoses:  Acute midline low back pain with left-sided sciatica  Non-traumatic mid back pain     Discharge Instructions      Gentle range of motion exercises Please take medications as prescribed Heating pad use on the 20 minutes on-20 minutes off cycle x 4 cycles daily No indication for x-rays Return to urgent care if you have any worsening symptoms.   ED Prescriptions     Medication Sig Dispense Auth. Provider   methocarbamol (ROBAXIN) 500 MG tablet Take 1 tablet (500 mg total) by mouth at bedtime as needed. 20 tablet Odin Mariani, Britta Mccreedy, MD   ibuprofen (ADVIL) 600 MG tablet Take 1 tablet (600 mg total) by mouth every 6 (six) hours as needed. 30 tablet Cristo Ausburn, Britta Mccreedy, MD      PDMP not reviewed this encounter.   Merrilee Jansky, MD 11/28/20 1213

## 2020-11-30 ENCOUNTER — Encounter: Payer: Self-pay | Admitting: Adult Health

## 2020-11-30 ENCOUNTER — Ambulatory Visit (INDEPENDENT_AMBULATORY_CARE_PROVIDER_SITE_OTHER): Payer: PPO | Admitting: Adult Health

## 2020-11-30 ENCOUNTER — Other Ambulatory Visit: Payer: Self-pay

## 2020-11-30 VITALS — BP 110/70 | HR 79 | Temp 98.5°F | Ht 68.0 in | Wt 258.0 lb

## 2020-11-30 DIAGNOSIS — R351 Nocturia: Secondary | ICD-10-CM

## 2020-11-30 DIAGNOSIS — M545 Low back pain, unspecified: Secondary | ICD-10-CM

## 2020-11-30 DIAGNOSIS — N401 Enlarged prostate with lower urinary tract symptoms: Secondary | ICD-10-CM | POA: Diagnosis not present

## 2020-11-30 MED ORDER — TAMSULOSIN HCL 0.4 MG PO CAPS
0.4000 mg | ORAL_CAPSULE | Freq: Every day | ORAL | 1 refills | Status: DC
Start: 2020-11-30 — End: 2022-02-01

## 2020-11-30 NOTE — Patient Instructions (Addendum)
It was great seeing you today   I am glad your back is feeling better   I am going to prescribe you a medication called Flomax which will help you with getting up in the middle of the night to urinate   We will see how you are doing with this medication at your physical

## 2020-11-30 NOTE — Progress Notes (Signed)
Subjective:    Patient ID: David Vincent, male    DOB: 1951-02-17, 70 y.o.   MRN: [id number]  HPI  70 year old male who  has a past medical history of BACK PAIN (03/10/2007), CAD (coronary artery disease), ED (erectile dysfunction), GERD (gastroesophageal reflux disease), History of mumps orchitis, Hyperlipidemia, HYPERTENSION (11/15/2006), INGUINAL HERNIA, RIGHT (12/05/2009), LEG CRAMPS (12/03/2008), SLEEP APNEA (11/16/2008), Sleep apnea, Unstable angina (HCC), and VERTIGO, POSITIONAL (08/08/2009).  He was seen at urgent care 5 days ago with 1 week history of mid to lower back pain and right rib pain.  At this time he reported that he helped his son move to a new residence and he had lifted several heavy objects.  Back pain was described as moderate severity, throbbing, aggravated by movement and no relieving factors.  His right rib pain was worse with taking inspiration.  He had not had any recent long distance travel, calf pain, tenderness  He was prescribed Robaxin 500 mg at bedtime as needed and advised to take 600 mg of ibuprofen every 6 hours as needed for his low back pain.  There was no indication for x-ray at this time.  He has for his right-sided chest pain was thought that this was likely pleuritic.  His Wells Score was low for PE  Today he reports that today he feels much better. He is no longer taking Robaxin or Motrin.   Additionally, he reports that since the last time we saw each other, he has been getting up almost every hour at night to urinate. This has been going on for months. Denies dysuria or hematuria, decreased stream, or incomplete bladder emptying.    Review of Systems See HPI   Past Medical History:  Diagnosis Date   BACK PAIN 03/10/2007   CAD (coronary artery disease)    A.  01/05/2002 Cath - nonobs dzs (LAD 20prox, RI 30-40 prox)   ED (erectile dysfunction)    GERD (gastroesophageal reflux disease)    History of mumps orchitis    Hyperlipidemia    HYPERTENSION  11/15/2006   INGUINAL HERNIA, RIGHT 12/05/2009   LEG CRAMPS 12/03/2008   SLEEP APNEA 11/16/2008   Sleep apnea    no cpap    Unstable angina (HCC)    VERTIGO, POSITIONAL 08/08/2009    Social History   Socioeconomic History   Marital status: Married    Spouse name: Not on file   Number of children: Not on file   Years of education: Not on file   Highest education level: Not on file  Occupational History   Not on file  Tobacco Use   Smoking status: Never   Smokeless tobacco: Never  Substance and Sexual Activity   Alcohol use: No   Drug use: No   Sexual activity: Not on file  Other Topics Concern   Not on file  Social History Narrative   05/02/2011:  Currently unemployed.  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  Does not exercise or adhere to any specific diet.   Social Determinants of Health   Financial Resource Strain: Low Risk    Difficulty of Paying Living Expenses: Not hard at all  Food Insecurity: No Food Insecurity   Worried About Programme researcher, broadcasting/film/video in the Last Year: Never true   Ran Out of Food in the Last Year: Never true  Transportation Needs: No Transportation Needs   Lack of Transportation (Medical): No   Lack of  Transportation (Non-Medical): No  Physical Activity: Inactive   Days of Exercise per Week: 0 days   Minutes of Exercise per Session: 0 min  Stress: No Stress Concern Present   Feeling of Stress : Not at all  Social Connections: Moderately Isolated   Frequency of Communication with Friends and Family: More than three times a week   Frequency of Social Gatherings with Friends and Family: More than three times a week   Attends Religious Services: Never   Database administrator or Organizations: No   Attends Engineer, structural: Never   Marital Status: Married  Catering manager Violence: Not At Risk   Fear of Current or Ex-Partner: No   Emotionally Abused: No   Physically Abused: No   Sexually Abused: No    Past Surgical  History:  Procedure Laterality Date   CARDIAC CATHETERIZATION     COLONOSCOPY     FOOT SURGERY Right 2019   KNEE ARTHROSCOPY     Right   SHOULDER SURGERY Bilateral     Family History  Problem Relation Age of Onset   Pneumonia Mother        died 103 - hip fx/pna   Heart attack Father        died 78   Hypertension Sister        alive   Cancer Brother        died - agent orange exposure   Hypertension Brother        alive   Colon cancer Neg Hx    Colon polyps Neg Hx    Esophageal cancer Neg Hx    Rectal cancer Neg Hx    Stomach cancer Neg Hx     Allergies  Allergen Reactions   Bee Venom Anaphylaxis   Gabapentin Other (See Comments)    Hallucinations    Codeine Phosphate Itching   Penicillins Other (See Comments)    Has patient had a PCN reaction causing immediate rash, facial/tongue/throat swelling, SOB or lightheadedness with hypotension: Unknown, childhood reaction Has patient had a PCN reaction causing severe rash involving mucus membranes or skin necrosis: No Has patient had a PCN reaction that required hospitalization No Has patient had a PCN reaction occurring within the last 10 years: No If all of the above answers are "NO", then may proceed with Cephalosporin use.     Current Outpatient Medications on File Prior to Visit  Medication Sig Dispense Refill   acetaminophen (TYLENOL) 500 MG tablet Take 500 mg by mouth every 6 (six) hours as needed.     amLODipine (NORVASC) 5 MG tablet Take 1 tablet (5 mg total) by mouth daily. 90 tablet 3   atorvastatin (LIPITOR) 20 MG tablet Take 1 tablet by mouth once daily 90 tablet 0   EPINEPHrine 0.3 mg/0.3 mL IJ SOAJ injection Inject 0.3 mg into the muscle as needed for anaphylaxis. 1 each 5   ibuprofen (ADVIL) 600 MG tablet Take 1 tablet (600 mg total) by mouth every 6 (six) hours as needed. 30 tablet 0   loperamide (IMODIUM) 2 MG capsule Take 1 capsule (2 mg total) by mouth 4 (four) times daily as needed for diarrhea or loose  stools. 12 capsule 0   losartan (COZAAR) 100 MG tablet TAKE 1 TABLET BY MOUTH ONCE DAILY 90 tablet 3   meclizine (ANTIVERT) 25 MG tablet Take 1 tablet (25 mg total) by mouth 3 (three) times daily as needed for dizziness. 30 tablet 2   methocarbamol (ROBAXIN) 500 MG  tablet Take 1 tablet (500 mg total) by mouth at bedtime as needed. 20 tablet 0   mupirocin ointment (BACTROBAN) 2 % Apply 1 application topically 3 (three) times daily. 22 g 0   omeprazole (PRILOSEC) 20 MG capsule Take 1 capsule by mouth once daily in the morning 90 capsule 2   predniSONE (DELTASONE) 20 MG tablet Take 3 tablets PO daily x 3 days, then 2 tablets PO daily x 4 days, then 1 tablet PO daily x 3 days, then 1/2 tablet PO daily x 2 days. 21 tablet 0   Sodium Sulfate-Mag Sulfate-KCl (SUTAB) [phone] MG TABS Take 24 tablets by mouth as directed. MANUFACTURER CODES!! BIN: [id number] PCN: CN GROUP: [id number] MEMBER ID: [id number];[employer] AS SECONDARY INSURANCE ;NO PRIOR AUTHORIZATION 24 tablet 0   No current facility-administered medications on file prior to visit.    BP 110/70   Pulse 79   Temp 98.5 F (36.9 C) (Oral)   Ht 5\' 8"  (1.727 m)   Wt 258 lb (117 kg)   SpO2 96%   BMI 39.23 kg/m       Objective:   Physical Exam Vitals and nursing note reviewed.  Constitutional:      Appearance: Normal appearance.  Musculoskeletal:        General: Normal range of motion.  Skin:    General: Skin is warm and dry.     Capillary Refill: Capillary refill takes less than 2 seconds.  Neurological:     General: No focal deficit present.     Mental Status: He is alert and oriented to person, place, and time.  Psychiatric:        Mood and Affect: Mood normal.        Behavior: Behavior normal.        Thought Content: Thought content normal.        Judgment: Judgment normal.      Assessment & Plan:  1. Acute midline low back pain without sciatica - Resolved   2. Benign prostatic hyperplasia with nocturia - Will trial him on  flomax.  - no signs of uti - tamsulosin (FLOMAX) 0.4 MG CAPS capsule; Take 1 capsule (0.4 mg total) by mouth daily.  Dispense: 90 capsule; Refill: 1  , NP

## 2020-12-11 ENCOUNTER — Other Ambulatory Visit: Payer: Self-pay | Admitting: Adult Health

## 2021-01-16 ENCOUNTER — Encounter: Payer: Self-pay | Admitting: Internal Medicine

## 2021-01-18 ENCOUNTER — Ambulatory Visit (INDEPENDENT_AMBULATORY_CARE_PROVIDER_SITE_OTHER): Payer: PPO

## 2021-01-18 ENCOUNTER — Other Ambulatory Visit: Payer: Self-pay

## 2021-01-18 VITALS — BP 132/68 | HR 78 | Temp 98.2°F | Ht 70.0 in | Wt 262.0 lb

## 2021-01-18 DIAGNOSIS — Z0000 Encounter for general adult medical examination without abnormal findings: Secondary | ICD-10-CM

## 2021-01-18 NOTE — Progress Notes (Signed)
Subjective:   David Vincent is a 70 y.o. male who presents for an Initial Medicare Annual Wellness Visit.  Review of Systems           Objective:    There were no vitals filed for this visit. There is no height or weight on file to calculate BMI.  Advanced Directives 01/13/2020 08/16/2019 02/09/2019 11/17/2015 09/08/2015 12/29/2011 05/02/2011  Does Patient Have a Medical Advance Directive? No No No No No Patient does not have advance directive;Patient would not like information Patient does not have advance directive  Does patient want to make changes to medical advance directive? No - Patient declined - - - - - -  Would patient like information on creating a medical advance directive? - No - Patient declined No - Patient declined No - patient declined information - - -  Pre-existing out of facility DNR order (yellow form or pink MOST form) - - - - - - No    Current Medications (verified) Outpatient Encounter Medications as of 01/18/2021  Medication Sig   acetaminophen (TYLENOL) 500 MG tablet Take 500 mg by mouth every 6 (six) hours as needed.   amLODipine (NORVASC) 5 MG tablet Take 1 tablet (5 mg total) by mouth daily.   atorvastatin (LIPITOR) 20 MG tablet Take 1 tablet by mouth once daily   EPINEPHrine 0.3 mg/0.3 mL IJ SOAJ injection Inject 0.3 mg into the muscle as needed for anaphylaxis.   losartan (COZAAR) 100 MG tablet TAKE 1 TABLET BY MOUTH ONCE DAILY   meclizine (ANTIVERT) 25 MG tablet Take 1 tablet (25 mg total) by mouth 3 (three) times daily as needed for dizziness.   mupirocin ointment (BACTROBAN) 2 % Apply 1 application topically 3 (three) times daily.   omeprazole (PRILOSEC) 20 MG capsule Take 1 capsule by mouth once daily in the morning   Sodium Sulfate-Mag Sulfate-KCl (SUTAB) [id number] MG TABS Take 24 tablets by mouth as directed. MANUFACTURER CODES!! BIN: [id number] PCN: CN GROUP: [id number] MEMBER ID: [id number];RDE AS SECONDARY INSURANCE ;NO PRIOR AUTHORIZATION    tamsulosin (FLOMAX) 0.4 MG CAPS capsule Take 1 capsule (0.4 mg total) by mouth daily.   No facility-administered encounter medications on file as of 01/18/2021.    Allergies (verified) Bee venom, Gabapentin, Codeine phosphate, and Penicillins   History: Past Medical History:  Diagnosis Date   BACK PAIN 03/10/2007   CAD (coronary artery disease)    A.  01/05/2002 Cath - nonobs dzs (LAD 20prox, RI 30-40 prox)   ED (erectile dysfunction)    GERD (gastroesophageal reflux disease)    History of mumps orchitis    Hyperlipidemia    HYPERTENSION 11/15/2006   INGUINAL HERNIA, RIGHT 12/05/2009   LEG CRAMPS 12/03/2008   SLEEP APNEA 11/16/2008   Sleep apnea    no cpap    Unstable angina (HCC)    VERTIGO, POSITIONAL 08/08/2009   Past Surgical History:  Procedure Laterality Date   CARDIAC CATHETERIZATION     COLONOSCOPY     FOOT SURGERY Right 2019   KNEE ARTHROSCOPY     Right   SHOULDER SURGERY Bilateral    Family History  Problem Relation Age of Onset   Pneumonia Mother        died 29 - hip fx/pna   Heart attack Father        died 74   Hypertension Sister        alive   Cancer Brother        died - agent orange exposure  Hypertension Brother        alive   Colon cancer Neg Hx    Colon polyps Neg Hx    Esophageal cancer Neg Hx    Rectal cancer Neg Hx    Stomach cancer Neg Hx    Social History   Socioeconomic History   Marital status: Married    Spouse name: Not on file   Number of children: Not on file   Years of education: Not on file   Highest education level: Not on file  Occupational History   Not on file  Tobacco Use   Smoking status: Never   Smokeless tobacco: Never  Substance and Sexual Activity   Alcohol use: No   Drug use: No   Sexual activity: Not on file  Other Topics Concern   Not on file  Social History Narrative   05/02/2011:  Currently unemployed.  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  Does not exercise or adhere to any specific  diet.   Social Determinants of Health   Financial Resource Strain: Not on file  Food Insecurity: Not on file  Transportation Needs: Not on file  Physical Activity: Not on file  Stress: Not on file  Social Connections: Not on file    Tobacco Counseling Counseling given: Not Answered   Clinical Intake:                 Diabetic?no         Activities of Daily Living No flowsheet data found.  Patient Care Team: [name], NP as PCP - General (Family Medicine)  Indicate any recent Medical Services you may have received from other than [facility] providers in the past year (date may be approximate).     Assessment:   This is a routine wellness examination for David Vincent.  Hearing/Vision screen No results found.  Dietary issues and exercise activities discussed:     Goals Addressed   None    Depression Screen PHQ 2/9 Scores 02/24/2020 01/13/2020 02/11/2019 09/30/2017 09/25/2016 09/23/2015 03/09/2014  PHQ - 2 Score 0 0 0 0 0 0 0  PHQ- 9 Score - 0 - - - - -    Fall Risk Fall Risk  02/24/2020 01/13/2020 03/04/2019 02/11/2019 09/30/2017  Falls in the past year? 0 0 0 0 No  Comment - - Emmi Telephone Survey: data to providers prior to load - -  Number falls in past yr: 0 0 - 0 -  Injury with Fall? 0 0 - 0 -  Risk for fall due to : - No Fall Risks - - -  Follow up Falls evaluation completed Falls evaluation completed;Falls prevention discussed - Falls evaluation completed -    FALL RISK PREVENTION PERTAINING TO THE HOME:  Any stairs in or around the home? No  If so, are there any without handrails? No  Home free of loose throw rugs in walkways, pet beds, electrical cords, etc? Yes  Adequate lighting in your home to reduce risk of falls? Yes   ASSISTIVE DEVICES UTILIZED TO PREVENT FALLS:  Life alert? No  Use of a cane, walker or w/c? No  Grab bars in the bathroom? No  Shower chair or bench in shower? No  Elevated toilet seat or a handicapped toilet? Yes    TIMED UP AND GO:  Was the test performed? Yes .  Length of time to ambulate 10 feet: 10 sec.   Gait steady and fast without use of assistive  device  Cognitive Function:    Normal cognitive status assessed by direct observation by this Nurse Health Advisor. No abnormalities found.      Immunizations Immunization History  Administered Date(s) Administered   Fluad Quad(high Dose 65+) 01/22/2019, 01/27/2020   Influenza Split 12/14/2011   Influenza,inj,Quad PF,6+ Mos 03/09/2014   PFIZER(Purple Top)SARS-COV-2 Vaccination 06/19/2019, 07/13/2019   Pneumococcal Conjugate-13 04/16/2016   Pneumococcal Polysaccharide-23 09/30/2017   Tdap 03/07/2015   Zoster, Live 10/25/2015    TDAP status: Up to date  Flu Vaccine status: Up to date  Pneumococcal vaccine status: Up to date  Covid-19 vaccine status: Completed vaccines  Qualifies for Shingles Vaccine? Yes   Zostavax completed No   Shingrix Completed?: No.    Education has been provided regarding the importance of this vaccine. Patient has been advised to call insurance company to determine out of pocket expense if they have not yet received this vaccine. Advised may also receive vaccine at local pharmacy or Health Dept. Verbalized acceptance and understanding.  Screening Tests Health Maintenance  Topic Date Due   Zoster Vaccines- Shingrix (1 of 2) Never done   COVID-19 Vaccine (3 - Booster for Pfizer series) 12/13/2019   COLONOSCOPY (Pts 45-26yrs Insurance coverage will need to be confirmed)  01/10/2020   INFLUENZA VACCINE  11/07/2020   TETANUS/TDAP  03/06/2025   Hepatitis C Screening  Completed   HPV VACCINES  Aged Out    Health Maintenance  Health Maintenance Due  Topic Date Due   Zoster Vaccines- Shingrix (1 of 2) Never done   COVID-19 Vaccine (3 - Booster for Pfizer series) 12/13/2019   COLONOSCOPY (Pts 45-83yrs Insurance coverage will need to be confirmed)  01/10/2020   INFLUENZA VACCINE  11/07/2020    Colorectal  cancer screening: Type of screening: Colonoscopy. Completed Scheduled . Repeat every 02/08/2021 years  Lung Cancer Screening: (Low Dose CT Chest recommended if Age 26-80 years, 30 pack-year currently smoking OR have quit w/in 15years.) does not qualify.   Lung Cancer Screening Referral: n/a  Additional Screening:n/a  Hepatitis C Screening: does not qualify; Completed 09/30/2017  Vision Screening: Recommended annual ophthalmology exams for early detection of glaucoma and other disorders of the eye. Is the patient up to date with their annual eye exam?  Yes  Who is the provider or what is the name of the office in which the patient attends annual eye exams? Dr.Davis  If pt is not established with a provider, would they like to be referred to a provider to establish care? No .   Dental Screening: Recommended annual dental exams for proper oral hygiene  Community Resource Referral / Chronic Care Management: CRR required this visit?  No   CCM required this visit?  No      Plan:     I have personally reviewed and noted the following in the patient's chart:   Medical and social history Use of alcohol, tobacco or illicit drugs  Current medications and supplements including opioid prescriptions. Patient is not currently taking opioid prescriptions. Functional ability and status Nutritional status Physical activity Advanced directives List of other physicians Hospitalizations, surgeries, and ER visits in previous 12 months Vitals Screenings to include cognitive, depression, and falls Referrals and appointments  In addition, I have reviewed and discussed with patient certain preventive protocols, quality metrics, and best practice recommendations. A written personalized care plan for preventive services as well as general preventive health recommendations were provided to patient.     March Rummage, LPN   [id number]  Nurse Notes: none

## 2021-01-18 NOTE — Patient Instructions (Signed)
[name] , Thank you for taking time to come for your Medicare Wellness Visit. I appreciate your ongoing commitment to your health goals. Please review the following plan we discussed and let me know if I can assist you in the future.   Screening recommendations/referrals: Colonoscopy: Schedule 02/08/2021 Recommended yearly ophthalmology/optometry visit for glaucoma screening and checkup Recommended yearly dental visit for hygiene and checkup  Vaccinations: Influenza vaccine: due in fall 2022  Pneumococcal vaccine: completed vaccine series  Tdap vaccine: 03/07/2015 Shingles vaccine: declined due to cost     Advanced directives: none   Conditions/risks identified: none   Next appointment: none   Preventive Care 65 Years and Older, Male Preventive care refers to lifestyle choices and visits with your health care provider that can promote health and wellness. What does preventive care include? A yearly physical exam. This is also called an annual well check. Dental exams once or twice a year. Routine eye exams. Ask your health care provider how often you should have your eyes checked. Personal lifestyle choices, including: Daily care of your teeth and gums. Regular physical activity. Eating a healthy diet. Avoiding tobacco and drug use. Limiting alcohol use. Practicing safe sex. Taking low doses of aspirin every day. Taking vitamin and mineral supplements as recommended by your health care provider. What happens during an annual well check? The services and screenings done by your health care provider during your annual well check will depend on your age, overall health, lifestyle risk factors, and family history of disease. Counseling  Your health care provider may ask you questions about your: Alcohol use. Tobacco use. Drug use. Emotional well-being. Home and relationship well-being. Sexual activity. Eating habits. History of falls. Memory and ability to understand  (cognition). Work and work Astronomer. Screening  You may have the following tests or measurements: Height, weight, and BMI. Blood pressure. Lipid and cholesterol levels. These may be checked every 5 years, or more frequently if you are over 2 years old. Skin check. Lung cancer screening. You may have this screening every year starting at age 33 if you have a 30-pack-year history of smoking and currently smoke or have quit within the past 15 years. Fecal occult blood test (FOBT) of the stool. You may have this test every year starting at age 80. Flexible sigmoidoscopy or colonoscopy. You may have a sigmoidoscopy every 5 years or a colonoscopy every 10 years starting at age 27. Prostate cancer screening. Recommendations will vary depending on your family history and other risks. Hepatitis C blood test. Hepatitis B blood test. Sexually transmitted disease (STD) testing. Diabetes screening. This is done by checking your blood sugar (glucose) after you have not eaten for a while (fasting). You may have this done every 1-3 years. Abdominal aortic aneurysm (AAA) screening. You may need this if you are a current or former smoker. Osteoporosis. You may be screened starting at age 79 if you are at high risk. Talk with your health care provider about your test results, treatment options, and if necessary, the need for more tests. Vaccines  Your health care provider may recommend certain vaccines, such as: Influenza vaccine. This is recommended every year. Tetanus, diphtheria, and acellular pertussis (Tdap, Td) vaccine. You may need a Td booster every 10 years. Zoster vaccine. You may need this after age 79. Pneumococcal 13-valent conjugate (PCV13) vaccine. One dose is recommended after age 78. Pneumococcal polysaccharide (PPSV23) vaccine. One dose is recommended after age 24. Talk to your health care provider about which screenings  and vaccines you need and how often you need them. This  information is not intended to replace advice given to you by your health care provider. Make sure you discuss any questions you have with your health care provider. Document Released: 04/22/2015 Document Revised: 12/14/2015 Document Reviewed: 01/25/2015 Elsevier Interactive Patient Education  2017 [place] Prevention in the Home Falls can cause injuries. They can happen to people of all ages. There are many things you can do to make your home safe and to help prevent falls. What can I do on the outside of my home? Regularly fix the edges of walkways and driveways and fix any cracks. Remove anything that might make you trip as you walk through a door, such as a raised step or threshold. Trim any bushes or trees on the path to your home. Use bright outdoor lighting. Clear any walking paths of anything that might make someone trip, such as rocks or tools. Regularly check to see if handrails are loose or broken. Make sure that both sides of any steps have handrails. Any raised decks and porches should have guardrails on the edges. Have any leaves, snow, or ice cleared regularly. Use sand or salt on walking paths during winter. Clean up any spills in your garage right away. This includes oil or grease spills. What can I do in the bathroom? Use night lights. Install grab bars by the toilet and in the tub and shower. Do not use towel bars as grab bars. Use non-skid mats or decals in the tub or shower. If you need to sit down in the shower, use a plastic, non-slip stool. Keep the floor dry. Clean up any water that spills on the floor as soon as it happens. Remove soap buildup in the tub or shower regularly. Attach bath mats securely with double-sided non-slip rug tape. Do not have throw rugs and other things on the floor that can make you trip. What can I do in the bedroom? Use night lights. Make sure that you have a light by your bed that is easy to reach. Do not use any sheets or  blankets that are too big for your bed. They should not hang down onto the floor. Have a firm chair that has side arms. You can use this for support while you get dressed. Do not have throw rugs and other things on the floor that can make you trip. What can I do in the kitchen? Clean up any spills right away. Avoid walking on wet floors. Keep items that you use a lot in easy-to-reach places. If you need to reach something above you, use a strong step stool that has a grab bar. Keep electrical cords out of the way. Do not use floor polish or wax that makes floors slippery. If you must use wax, use non-skid floor wax. Do not have throw rugs and other things on the floor that can make you trip. What can I do with my stairs? Do not leave any items on the stairs. Make sure that there are handrails on both sides of the stairs and use them. Fix handrails that are broken or loose. Make sure that handrails are as long as the stairways. Check any carpeting to make sure that it is firmly attached to the stairs. Fix any carpet that is loose or worn. Avoid having throw rugs at the top or bottom of the stairs. If you do have throw rugs, attach them to the floor with carpet tape.  Make sure that you have a light switch at the top of the stairs and the bottom of the stairs. If you do not have them, ask someone to add them for you. What else can I do to help prevent falls? Wear shoes that: Do not have high heels. Have rubber bottoms. Are comfortable and fit you well. Are closed at the toe. Do not wear sandals. If you use a stepladder: Make sure that it is fully opened. Do not climb a closed stepladder. Make sure that both sides of the stepladder are locked into place. Ask someone to hold it for you, if possible. Clearly mark and make sure that you can see: Any grab bars or handrails. First and last steps. Where the edge of each step is. Use tools that help you move around (mobility aids) if they are  needed. These include: Canes. Walkers. Scooters. Crutches. Turn on the lights when you go into a dark area. Replace any light bulbs as soon as they burn out. Set up your furniture so you have a clear path. Avoid moving your furniture around. If any of your floors are uneven, fix them. If there are any pets around you, be aware of where they are. Review your medicines with your doctor. Some medicines can make you feel dizzy. This can increase your chance of falling. Ask your doctor what other things that you can do to help prevent falls. This information is not intended to replace advice given to you by your health care provider. Make sure you discuss any questions you have with your health care provider. Document Released: 01/20/2009 Document Revised: 09/01/2015 Document Reviewed: 04/30/2014 Elsevier Interactive Patient Education  2017 [employer].

## 2021-01-20 NOTE — Progress Notes (Signed)
Subjective:   David Vincent is a 70 y.o. male who presents for an Initial Medicare Annual Wellness Visit.  Review of Systems     Cardiac Risk Factors include: advanced age (>81men, >87 women);male gender;hypertension;dyslipidemia     Objective:    Today's Vitals   01/18/21 1321  BP: 132/68  Pulse: 78  Temp: 98.2 F (36.8 C)  SpO2: 96%  Weight: 262 lb (118.8 kg)  Height: 5\' 10"  (1.778 m)   Body mass index is 37.59 kg/m.  Advanced Directives 01/18/2021 01/13/2020 08/16/2019 02/09/2019 11/17/2015 09/08/2015 12/29/2011  Does Patient Have a Medical Advance Directive? No No No No No No Patient does not have advance directive;Patient would not like information  Does patient want to make changes to medical advance directive? - No - Patient declined - - - - -  Would patient like information on creating a medical advance directive? No - Patient declined - No - Patient declined No - Patient declined No - patient declined information - -  Pre-existing out of facility DNR order (yellow form or pink MOST form) - - - - - - -    Current Medications (verified) Outpatient Encounter Medications as of 01/18/2021  Medication Sig   acetaminophen (TYLENOL) 500 MG tablet Take 500 mg by mouth every 6 (six) hours as needed.   amLODipine (NORVASC) 5 MG tablet Take 1 tablet (5 mg total) by mouth daily.   atorvastatin (LIPITOR) 20 MG tablet Take 1 tablet by mouth once daily   EPINEPHrine 0.3 mg/0.3 mL IJ SOAJ injection Inject 0.3 mg into the muscle as needed for anaphylaxis.   losartan (COZAAR) 100 MG tablet TAKE 1 TABLET BY MOUTH ONCE DAILY   meclizine (ANTIVERT) 25 MG tablet Take 1 tablet (25 mg total) by mouth 3 (three) times daily as needed for dizziness.   mupirocin ointment (BACTROBAN) 2 % Apply 1 application topically 3 (three) times daily.   omeprazole (PRILOSEC) 20 MG capsule Take 1 capsule by mouth once daily in the morning   Sodium Sulfate-Mag Sulfate-KCl (SUTAB) [phone] MG TABS Take 24  tablets by mouth as directed. MANUFACTURER CODES!! BIN: [id number] PCN: CN GROUP: [id number] MEMBER ID: [id number] AS SECONDARY INSURANCE ;NO PRIOR AUTHORIZATION   tamsulosin (FLOMAX) 0.4 MG CAPS capsule Take 1 capsule (0.4 mg total) by mouth daily.   No facility-administered encounter medications on file as of 01/18/2021.    Allergies (verified) Bee venom, Gabapentin, Codeine phosphate, and Penicillins   History: Past Medical History:  Diagnosis Date   BACK PAIN 03/10/2007   CAD (coronary artery disease)    A.  01/05/2002 Cath - nonobs dzs (LAD 20prox, RI 30-40 prox)   ED (erectile dysfunction)    GERD (gastroesophageal reflux disease)    History of mumps orchitis    Hyperlipidemia    HYPERTENSION 11/15/2006   INGUINAL HERNIA, RIGHT 12/05/2009   LEG CRAMPS 12/03/2008   SLEEP APNEA 11/16/2008   Sleep apnea    no cpap    Unstable angina (HCC)    VERTIGO, POSITIONAL 08/08/2009   Past Surgical History:  Procedure Laterality Date   CARDIAC CATHETERIZATION     COLONOSCOPY     FOOT SURGERY Right 2019   KNEE ARTHROSCOPY     Right   SHOULDER SURGERY Bilateral    Family History  Problem Relation Age of Onset   Pneumonia Mother        died 85 - hip fx/pna   Heart attack Father        died 40  Hypertension Sister        alive   Cancer Brother        died - agent orange exposure   Hypertension Brother        alive   Colon cancer Neg Hx    Colon polyps Neg Hx    Esophageal cancer Neg Hx    Rectal cancer Neg Hx    Stomach cancer Neg Hx    Social History   Socioeconomic History   Marital status: Married    Spouse name: Not on file   Number of children: Not on file   Years of education: Not on file   Highest education level: Not on file  Occupational History   Not on file  Tobacco Use   Smoking status: Never   Smokeless tobacco: Never  Substance and Sexual Activity   Alcohol use: No   Drug use: No   Sexual activity: Not on file  Other Topics Concern   Not on file   Social History Narrative   05/02/2011:  Currently unemployed.  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  Does not exercise or adhere to any specific diet.   Social Determinants of Health   Financial Resource Strain: Low Risk    Difficulty of Paying Living Expenses: Not hard at all  Food Insecurity: No Food Insecurity   Worried About Programme researcher, broadcasting/film/video in the Last Year: Never true   Ran Out of Food in the Last Year: Never true  Transportation Needs: No Transportation Needs   Lack of Transportation (Medical): No   Lack of Transportation (Non-Medical): No  Physical Activity: Sufficiently Active   Days of Exercise per Week: 5 days   Minutes of Exercise per Session: 60 min  Stress: No Stress Concern Present   Feeling of Stress : Not at all  Social Connections: Moderately Isolated   Frequency of Communication with Friends and Family: Twice a week   Frequency of Social Gatherings with Friends and Family: Twice a week   Attends Religious Services: Never   Diplomatic Services operational officer: No   Attends Engineer, structural: Never   Marital Status: Married    Tobacco Counseling Counseling given: Not Answered   Clinical Intake:  Pre-visit preparation completed: Yes  Pain : No/denies pain     Nutritional Risks: None Diabetes: No  How often do you need to have someone help you when you read instructions, pamphlets, or other written materials from your doctor or pharmacy?: 1 - Never What is the last grade level you completed in school?: High School  Diabetic?no  Interpreter Needed?: No  Information entered by :: L.Hayat Warbington,LPN   Activities of Daily Living In your present state of health, do you have any difficulty performing the following activities: 01/18/2021  Hearing? N  Vision? N  Difficulty concentrating or making decisions? N  Walking or climbing stairs? N  Dressing or bathing? N  Doing errands, shopping? N  Preparing Food and eating  ? N  Using the Toilet? N  In the past six months, have you accidently leaked urine? N  Do you have problems with loss of bowel control? N  Managing your Medications? N  Managing your Finances? N  Housekeeping or managing your Housekeeping? N  Some recent data might be hidden    Patient Care Team: [name], NP as PCP - General (Family Medicine)  Indicate any recent Medical Services you may have received from other  than [facility] providers in the past year (date may be approximate).     Assessment:   This is a routine wellness examination for Kavir.  Hearing/Vision screen Vision Screening - Comments:: Annual eye exams wears glasses   Dietary issues and exercise activities discussed: Current Exercise Habits: Home exercise routine, Type of exercise: walking, Time (Minutes): 30, Frequency (Times/Week): 5, Weekly Exercise (Minutes/Week): 150, Intensity: Mild, Exercise limited by: None identified   Goals Addressed   None   Depression Screen PHQ 2/9 Scores 01/18/2021 01/18/2021 02/24/2020 01/13/2020 02/11/2019 09/30/2017 09/25/2016  PHQ - 2 Score 0 0 0 0 0 0 0  PHQ- 9 Score - - - 0 - - -    Fall Risk Fall Risk  01/18/2021 02/24/2020 01/13/2020 03/04/2019 02/11/2019  Falls in the past year? 0 0 0 0 0  Comment - - - Emmi Telephone Survey: data to providers prior to load -  Number falls in past yr: 0 0 0 - 0  Injury with Fall? 0 0 0 - 0  Risk for fall due to : - - No Fall Risks - -  Follow up Falls evaluation completed Falls evaluation completed Falls evaluation completed;Falls prevention discussed - Falls evaluation completed    FALL RISK PREVENTION PERTAINING TO THE HOME:  Any stairs in or around the home? No  If so, are there any without handrails? No  Home free of loose throw rugs in walkways, pet beds, electrical cords, etc? Yes  Adequate lighting in your home to reduce risk of falls? Yes   ASSISTIVE DEVICES UTILIZED TO PREVENT FALLS:  Life alert? No  Use of a cane, walker  or w/c? No  Grab bars in the bathroom? No  Shower chair or bench in shower? No  Elevated toilet seat or a handicapped toilet? Yes   TIMED UP AND GO:  Was the test performed? Yes .  Length of time to ambulate 10 feet: 10 sec.   Gait steady and fast without use of assistive device  Cognitive Function:    Normal cognitive status assessed by direct observation by this Nurse Health Advisor. No abnormalities found.      Immunizations Immunization History  Administered Date(s) Administered   Fluad Quad(high Dose 65+) 01/22/2019, 01/27/2020   Influenza Split 12/14/2011   Influenza,inj,Quad PF,6+ Mos 03/09/2014   PFIZER(Purple Top)SARS-COV-2 Vaccination 06/19/2019, 07/13/2019   Pneumococcal Conjugate-13 04/16/2016   Pneumococcal Polysaccharide-23 09/30/2017   Tdap 03/07/2015   Zoster, Live 10/25/2015    TDAP status: Up to date  Flu Vaccine status: Up to date  Pneumococcal vaccine status: Up to date  Covid-19 vaccine status: Completed vaccines  Qualifies for Shingles Vaccine? Yes   Zostavax completed No   Shingrix Completed?: No.    Education has been provided regarding the importance of this vaccine. Patient has been advised to call insurance company to determine out of pocket expense if they have not yet received this vaccine. Advised may also receive vaccine at local pharmacy or Health Dept. Verbalized acceptance and understanding.  Screening Tests Health Maintenance  Topic Date Due   Zoster Vaccines- Shingrix (1 of 2) Never done   COVID-19 Vaccine (3 - Booster for Pfizer series) 12/13/2019   COLONOSCOPY (Pts 45-60yrs Insurance coverage will need to be confirmed)  01/10/2020   INFLUENZA VACCINE  11/07/2020   TETANUS/TDAP  03/06/2025   Hepatitis C Screening  Completed   HPV VACCINES  Aged Out    Health Maintenance  Health Maintenance Due  Topic Date Due   Zoster  Vaccines- Shingrix (1 of 2) Never done   COVID-19 Vaccine (3 - Booster for Pfizer series) 12/13/2019    COLONOSCOPY (Pts 45-64yrs Insurance coverage will need to be confirmed)  01/10/2020   INFLUENZA VACCINE  11/07/2020    Colorectal cancer screening: Type of screening: Colonoscopy. Completed Scheduled . Repeat every 02/08/2021 years  Lung Cancer Screening: (Low Dose CT Chest recommended if Age 49-80 years, 30 pack-year currently smoking OR have quit w/in 15years.) does not qualify.   Lung Cancer Screening Referral: n/a  Additional Screening:n/a  Hepatitis C Screening: does not qualify; Completed 09/30/2017  Vision Screening: Recommended annual ophthalmology exams for early detection of glaucoma and other disorders of the eye. Is the patient up to date with their annual eye exam?  Yes  Who is the provider or what is the name of the office in which the patient attends annual eye exams? Dr.Davis  If pt is not established with a provider, would they like to be referred to a provider to establish care? No .   Dental Screening: Recommended annual dental exams for proper oral hygiene  Community Resource Referral / Chronic Care Management: CRR required this visit?  No   CCM required this visit?  No      Plan:     I have personally reviewed and noted the following in the patient's chart:   Medical and social history Use of alcohol, tobacco or illicit drugs  Current medications and supplements including opioid prescriptions. Patient is not currently taking opioid prescriptions. Functional ability and status Nutritional status Physical activity Advanced directives List of other physicians Hospitalizations, surgeries, and ER visits in previous 12 months Vitals Screenings to include cognitive, depression, and falls Referrals and appointments  In addition, I have reviewed and discussed with patient certain preventive protocols, quality metrics, and best practice recommendations. A written personalized care plan for preventive services as well as general preventive health recommendations  were provided to patient.     March Rummage, LPN   01/23/5101   Nurse Notes: none

## 2021-01-22 ENCOUNTER — Other Ambulatory Visit: Payer: Self-pay | Admitting: Adult Health

## 2021-01-22 DIAGNOSIS — I10 Essential (primary) hypertension: Secondary | ICD-10-CM

## 2021-01-23 NOTE — Telephone Encounter (Signed)
FYI  Pt has a new PCP. RX will be denied. PT notified to contact neW pcp so that further refill request would be routed to new PCP. Pt verbalized understanding.

## 2021-01-27 ENCOUNTER — Encounter: Payer: Self-pay | Admitting: Adult Health

## 2021-01-27 ENCOUNTER — Other Ambulatory Visit: Payer: Self-pay | Admitting: Adult Health

## 2021-01-27 ENCOUNTER — Other Ambulatory Visit: Payer: Self-pay

## 2021-01-27 ENCOUNTER — Ambulatory Visit (INDEPENDENT_AMBULATORY_CARE_PROVIDER_SITE_OTHER): Payer: PPO | Admitting: Adult Health

## 2021-01-27 VITALS — BP 120/80 | HR 72 | Temp 97.9°F | Ht 68.75 in | Wt 257.0 lb

## 2021-01-27 DIAGNOSIS — Z0000 Encounter for general adult medical examination without abnormal findings: Secondary | ICD-10-CM | POA: Diagnosis not present

## 2021-01-27 DIAGNOSIS — G4730 Sleep apnea, unspecified: Secondary | ICD-10-CM | POA: Diagnosis not present

## 2021-01-27 DIAGNOSIS — G5603 Carpal tunnel syndrome, bilateral upper limbs: Secondary | ICD-10-CM | POA: Diagnosis not present

## 2021-01-27 DIAGNOSIS — Z23 Encounter for immunization: Secondary | ICD-10-CM | POA: Diagnosis not present

## 2021-01-27 DIAGNOSIS — I2510 Atherosclerotic heart disease of native coronary artery without angina pectoris: Secondary | ICD-10-CM | POA: Diagnosis not present

## 2021-01-27 DIAGNOSIS — R351 Nocturia: Secondary | ICD-10-CM | POA: Diagnosis not present

## 2021-01-27 DIAGNOSIS — I10 Essential (primary) hypertension: Secondary | ICD-10-CM

## 2021-01-27 DIAGNOSIS — N401 Enlarged prostate with lower urinary tract symptoms: Secondary | ICD-10-CM

## 2021-01-27 DIAGNOSIS — K219 Gastro-esophageal reflux disease without esophagitis: Secondary | ICD-10-CM | POA: Diagnosis not present

## 2021-01-27 LAB — COMPREHENSIVE METABOLIC PANEL
ALT: 15 U/L (ref 0–53)
AST: 20 U/L (ref 0–37)
Albumin: 4.6 g/dL (ref 3.5–5.2)
Alkaline Phosphatase: 102 U/L (ref 39–117)
BUN: 20 mg/dL (ref 6–23)
CO2: 27 mEq/L (ref 19–32)
Calcium: 9.8 mg/dL (ref 8.4–10.5)
Chloride: 105 mEq/L (ref 96–112)
Creatinine, Ser: 0.92 mg/dL (ref 0.40–1.50)
GFR: 84.5 mL/min (ref >60.00)
Glucose, Bld: 92 mg/dL (ref 70–99)
Potassium: 4.2 mEq/L (ref 3.5–5.1)
Sodium: 141 mEq/L (ref 135–145)
Total Bilirubin: 0.8 mg/dL (ref 0.2–1.2)
Total Protein: 7.1 g/dL (ref 6.0–8.3)

## 2021-01-27 LAB — CBC WITH DIFFERENTIAL/PLATELET
Basophils Absolute: 0 10*3/uL (ref 0.0–0.1)
Basophils Relative: 0.6 % (ref 0.0–3.0)
Eosinophils Absolute: 0.2 10*3/uL (ref 0.0–0.7)
Eosinophils Relative: 2.7 % (ref 0.0–5.0)
HCT: 40.9 % (ref 39.0–52.0)
Hemoglobin: 13.8 g/dL (ref 13.0–17.0)
Lymphocytes Relative: 34.5 % (ref 12.0–46.0)
Lymphs Abs: 2.5 10*3/uL (ref 0.7–4.0)
MCHC: 33.9 g/dL (ref 30.0–36.0)
MCV: 95.4 fl (ref 78.0–100.0)
Monocytes Absolute: 0.6 10*3/uL (ref 0.1–1.0)
Monocytes Relative: 8.1 % (ref 3.0–12.0)
Neutro Abs: 3.8 10*3/uL (ref 1.4–7.7)
Neutrophils Relative %: 54.1 % (ref 43.0–77.0)
Platelets: 235 10*3/uL (ref 150.0–400.0)
RBC: 4.28 Mil/uL (ref 4.22–5.81)
RDW: 13.2 % (ref 11.5–15.5)
WBC: 7.1 10*3/uL (ref 4.0–10.5)

## 2021-01-27 LAB — LIPID PANEL
Cholesterol: 107 mg/dL (ref 0–200)
HDL: 40.3 mg/dL (ref >39.00)
LDL Cholesterol: 49 mg/dL (ref 0–99)
NonHDL: 66.75
Total CHOL/HDL Ratio: 3
Triglycerides: 88 mg/dL (ref 0.0–149.0)
VLDL: 17.6 mg/dL (ref 0.0–40.0)

## 2021-01-27 LAB — PSA: PSA: 0.49 ng/mL (ref 0.10–4.00)

## 2021-01-27 LAB — TSH: TSH: 0.94 u[IU]/mL (ref 0.35–5.50)

## 2021-01-27 LAB — HEMOGLOBIN A1C: Hgb A1c MFr Bld: 5.1 % (ref 4.6–6.5)

## 2021-01-27 MED ORDER — PREDNISONE 10 MG PO TABS: ORAL_TABLET | ORAL | 0 refills | Status: DC

## 2021-01-27 NOTE — Progress Notes (Signed)
Subjective:    Patient ID: David Vincent, male    DOB: 1950-10-16, 70 y.o.   MRN: [id number]  HPI Patient presents for yearly preventative medicine examination. He is a pleasant 70 year old male who  has a past medical history of BACK PAIN (03/10/2007), CAD (coronary artery disease), ED (erectile dysfunction), GERD (gastroesophageal reflux disease), History of mumps orchitis, Hyperlipidemia, HYPERTENSION (11/15/2006), INGUINAL HERNIA, RIGHT (12/05/2009), LEG CRAMPS (12/03/2008), SLEEP APNEA (11/16/2008), Sleep apnea, Unstable angina (HCC), and VERTIGO, POSITIONAL (08/08/2009).  HTN - Prescribed Norvasc 5 mg daily and Cozaar 100 mg daily.  He denies dizziness, lightheadedness, chest pain, shortness of breath BP Readings from Last 3 Encounters:  01/27/21 120/80  01/18/21 132/68  11/30/20 110/70   Hyperlipidemia/CAD-prescribed Lipitor 20 mg daily. He does report lower leg cramping that has been going on for " a long time". He has to get out of bed and stretch.   GERD-is controlled with Prilosec 20 mg  OSA-refuses to wear CPAP  BPH -was started on Flomax roughly 2 months ago.  At this time he reported that he was having episodes of nocturia where he is getting up almost every hour to urinate.  This has been going on for months. Today he reports that there has been some improvement and is getting up about 50% less.   Osteoarthritis of multiple joints-is seen by orthopedics on a regular basis  Bilateral Wrist Pain -reports a "locking up" sensation and pretty constant pain along the thumb side of both hands, this has been an ongoing issue.  He has been using wrist splints without relief.  Does not necessarily have numbness and tingling in his fingers but does have some radiating pain.  Does a lot of repetitive motion  All immunizations and health maintenance protocols were reviewed with the patient and needed orders were placed.  Appropriate screening laboratory values were ordered for the patient  including screening of hyperlipidemia, renal function and hepatic function. If indicated by BPH, a PSA was ordered.  Medication reconciliation,  past medical history, social history, problem list and allergies were reviewed in detail with the patient  Goals were established with regard to weight loss, exercise, and  diet in compliance with medications Wt Readings from Last 3 Encounters:  01/27/21 257 lb (116.6 kg)  01/18/21 262 lb (118.8 kg)  11/30/20 258 lb (117 kg)   He has his colonoscopy scheduled for November 21st 2022.   Review of Systems  Constitutional: Negative.   HENT: Negative.    Eyes: Negative.   Respiratory: Negative.    Cardiovascular: Negative.   Gastrointestinal: Negative.   Endocrine: Negative.   Genitourinary: Negative.   Musculoskeletal:  Positive for arthralgias and back pain.  Skin: Negative.   Allergic/Immunologic: Negative.   Neurological: Negative.   Hematological: Negative.   Psychiatric/Behavioral: Negative.    All other systems reviewed and are negative.  Past Medical History:  Diagnosis Date   BACK PAIN 03/10/2007   CAD (coronary artery disease)    A.  01/05/2002 Cath - nonobs dzs (LAD 20prox, RI 30-40 prox)   ED (erectile dysfunction)    GERD (gastroesophageal reflux disease)    History of mumps orchitis    Hyperlipidemia    HYPERTENSION 11/15/2006   INGUINAL HERNIA, RIGHT 12/05/2009   LEG CRAMPS 12/03/2008   SLEEP APNEA 11/16/2008   Sleep apnea    no cpap    Unstable angina (HCC)    VERTIGO, POSITIONAL 08/08/2009    Social History   Socioeconomic History  Marital status: Married    Spouse name: Not on file   Number of children: Not on file   Years of education: Not on file   Highest education level: Not on file  Occupational History   Not on file  Tobacco Use   Smoking status: Never   Smokeless tobacco: Never  Substance and Sexual Activity   Alcohol use: No   Drug use: No   Sexual activity: Not on file  Other Topics Concern    Not on file  Social History Narrative   05/02/2011:  Currently unemployed.  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  Does not exercise or adhere to any specific diet.   Social Determinants of Health   Financial Resource Strain: Low Risk    Difficulty of Paying Living Expenses: Not hard at all  Food Insecurity: No Food Insecurity   Worried About Programme researcher, broadcasting/film/video in the Last Year: Never true   Ran Out of Food in the Last Year: Never true  Transportation Needs: No Transportation Needs   Lack of Transportation (Medical): No   Lack of Transportation (Non-Medical): No  Physical Activity: Sufficiently Active   Days of Exercise per Week: 5 days   Minutes of Exercise per Session: 60 min  Stress: No Stress Concern Present   Feeling of Stress : Not at all  Social Connections: Moderately Isolated   Frequency of Communication with Friends and Family: Twice a week   Frequency of Social Gatherings with Friends and Family: Twice a week   Attends Religious Services: Never   Diplomatic Services operational officer: No   Attends Engineer, structural: Never   Marital Status: Married  Catering manager Violence: Not At Risk   Fear of Current or Ex-Partner: No   Emotionally Abused: No   Physically Abused: No   Sexually Abused: No    Past Surgical History:  Procedure Laterality Date   CARDIAC CATHETERIZATION     COLONOSCOPY     FOOT SURGERY Right 2019   KNEE ARTHROSCOPY     Right   SHOULDER SURGERY Bilateral     Family History  Problem Relation Age of Onset   Pneumonia Mother        died 84 - hip fx/pna   Heart attack Father        died 70   Hypertension Sister        alive   Cancer Brother        died - agent orange exposure   Hypertension Brother        alive   Colon cancer Neg Hx    Colon polyps Neg Hx    Esophageal cancer Neg Hx    Rectal cancer Neg Hx    Stomach cancer Neg Hx     Allergies  Allergen Reactions   Bee Venom Anaphylaxis    Gabapentin Other (See Comments)    Hallucinations    Codeine Phosphate Itching   Penicillins Other (See Comments)    Has patient had a PCN reaction causing immediate rash, facial/tongue/throat swelling, SOB or lightheadedness with hypotension: Unknown, childhood reaction Has patient had a PCN reaction causing severe rash involving mucus membranes or skin necrosis: No Has patient had a PCN reaction that required hospitalization No Has patient had a PCN reaction occurring within the last 10 years: No If all of the above answers are "NO", then may proceed with Cephalosporin use.     Current Outpatient Medications  on File Prior to Visit  Medication Sig Dispense Refill   acetaminophen (TYLENOL) 500 MG tablet Take 500 mg by mouth every 6 (six) hours as needed.     amLODipine (NORVASC) 5 MG tablet Take 1 tablet (5 mg total) by mouth daily. 90 tablet 3   atorvastatin (LIPITOR) 20 MG tablet Take 1 tablet by mouth once daily 90 tablet 0   EPINEPHrine 0.3 mg/0.3 mL IJ SOAJ injection Inject 0.3 mg into the muscle as needed for anaphylaxis. 1 each 5   losartan (COZAAR) 100 MG tablet TAKE 1 TABLET BY MOUTH ONCE DAILY 90 tablet 3   meclizine (ANTIVERT) 25 MG tablet Take 1 tablet (25 mg total) by mouth 3 (three) times daily as needed for dizziness. 30 tablet 2   mupirocin ointment (BACTROBAN) 2 % Apply 1 application topically 3 (three) times daily. 22 g 0   omeprazole (PRILOSEC) 20 MG capsule Take 1 capsule by mouth once daily in the morning 90 capsule 2   Sodium Sulfate-Mag Sulfate-KCl (SUTAB) [phone] MG TABS Take 24 tablets by mouth as directed. MANUFACTURER CODES!! BIN: [id number] PCN: CN GROUP: [id number] MEMBER ID: [id number];[employer] AS SECONDARY INSURANCE ;NO PRIOR AUTHORIZATION 24 tablet 0   tamsulosin (FLOMAX) 0.4 MG CAPS capsule Take 1 capsule (0.4 mg total) by mouth daily. 90 capsule 1   No current facility-administered medications on file prior to visit.    BP 120/80   Pulse 72   Temp 97.9 F  (36.6 C) (Oral)   Ht 5' 8.75" (1.746 m)   Wt 257 lb (116.6 kg)   SpO2 96%   BMI 38.23 kg/m        Objective:   Physical Exam Vitals and nursing note reviewed.  Constitutional:      General: He is not in acute distress.    Appearance: Normal appearance. He is well-developed and normal weight.  HENT:     Head: Normocephalic and atraumatic.     Right Ear: Tympanic membrane, ear canal and external ear normal. There is no impacted cerumen.     Left Ear: Tympanic membrane, ear canal and external ear normal. There is no impacted cerumen.     Nose: Nose normal. No congestion or rhinorrhea.     Mouth/Throat:     Mouth: Mucous membranes are moist.     Pharynx: Oropharynx is clear. No oropharyngeal exudate or posterior oropharyngeal erythema.  Eyes:     General:        Right eye: No discharge.        Left eye: No discharge.     Extraocular Movements: Extraocular movements intact.     Conjunctiva/sclera: Conjunctivae normal.     Pupils: Pupils are equal, round, and reactive to light.  Neck:     Vascular: No carotid bruit.     Trachea: No tracheal deviation.  Cardiovascular:     Rate and Rhythm: Normal rate and regular rhythm.     Pulses: Normal pulses.     Heart sounds: Normal heart sounds. No murmur heard.   No friction rub. No gallop.  Pulmonary:     Effort: Pulmonary effort is normal. No respiratory distress.     Breath sounds: Normal breath sounds. No stridor. No wheezing, rhonchi or rales.  Chest:     Chest wall: No tenderness.  Abdominal:     General: Bowel sounds are normal. There is no distension.     Palpations: Abdomen is soft. There is no mass.     Tenderness: There is no abdominal  tenderness. There is no right CVA tenderness, left CVA tenderness, guarding or rebound.     Hernia: No hernia is present.  Musculoskeletal:        General: No swelling, tenderness, deformity or signs of injury. Normal range of motion.     Right lower leg: No edema.     Left lower leg: No  edema.  Lymphadenopathy:     Cervical: No cervical adenopathy.  Skin:    General: Skin is warm and dry.     Capillary Refill: Capillary refill takes less than 2 seconds.     Coloration: Skin is not jaundiced or pale.     Findings: No bruising, erythema, lesion or rash.  Neurological:     General: No focal deficit present.     Mental Status: He is alert and oriented to person, place, and time.     Cranial Nerves: No cranial nerve deficit.     Sensory: No sensory deficit.     Motor: No weakness.     Coordination: Coordination normal.     Gait: Gait normal.     Deep Tendon Reflexes: Reflexes normal.     Comments: Negative Tinel's but positive Phalen's test bilaterally  Psychiatric:        Mood and Affect: Mood normal.        Behavior: Behavior normal.        Thought Content: Thought content normal.        Judgment: Judgment normal.      Assessment & Plan:  1. Routine general medical examination at a health care facility -Continue with weight loss measures. -Follow-up in 1 year or sooner if needed - CBC with Differential/Platelet; Future - Comprehensive metabolic panel; Future - Hemoglobin A1c; Future - Lipid panel; Future - TSH; Future - TSH - Lipid panel - Hemoglobin A1c - Comprehensive metabolic panel - CBC with Differential/Platelet  2. Coronary artery disease involving native coronary artery of native heart without angina pectoris - Will have him stop statin for 2-4 weeks to see if his muscle cramps diminished.  He will follow-up with me via MyChart. - CBC with Differential/Platelet; Future - Comprehensive metabolic panel; Future - Hemoglobin A1c; Future - Lipid panel; Future - TSH; Future - TSH - Lipid panel - Hemoglobin A1c - Comprehensive metabolic panel - CBC with Differential/Platelet  3. Benign prostatic hyperplasia with nocturia - Improved. Continue with Flomax - CBC with Differential/Platelet; Future - Comprehensive metabolic panel; Future -  Hemoglobin A1c; Future - Lipid panel; Future - TSH; Future - PSA; Future - PSA - TSH - Lipid panel - Hemoglobin A1c - Comprehensive metabolic panel - CBC with Differential/Platelet  4. Essential hypertension - Controlled. No change in medications  - CBC with Differential/Platelet; Future - Comprehensive metabolic panel; Future - Hemoglobin A1c; Future - Lipid panel; Future - TSH; Future - TSH - Lipid panel - Hemoglobin A1c - Comprehensive metabolic panel - CBC with Differential/Platelet  5. Sleep apnea, unspecified type - Refuses CPAP  - CBC with Differential/Platelet; Future - Comprehensive metabolic panel; Future - Hemoglobin A1c; Future - Lipid panel; Future - TSH; Future - TSH - Lipid panel - Hemoglobin A1c - Comprehensive metabolic panel - CBC with Differential/Platelet  6. Gastroesophageal reflux disease, unspecified whether esophagitis present - Continue PPI  - CBC with Differential/Platelet; Future - Comprehensive metabolic panel; Future - Hemoglobin A1c; Future - Lipid panel; Future - TSH; Future - TSH - Lipid panel - Hemoglobin A1c - Comprehensive metabolic panel - CBC with Differential/Platelet  7. Need  for immunization against influenza  - Flu Vaccine QUAD High Dose(Fluad)  8. Carpal tunnel syndrome, bilateral  - predniSONE (DELTASONE) 10 MG tablet; Take 2 tabs for 7 days and then 1 tab for 7 days  Dispense: 21 tablet; Refill: 0

## 2021-01-27 NOTE — Patient Instructions (Signed)
It was great seeing you today   I will follow up with you regarding your blood work   Please follow up in 1 year for your next physical or sooner if needed

## 2021-01-29 ENCOUNTER — Other Ambulatory Visit: Payer: Self-pay | Admitting: Adult Health

## 2021-01-30 ENCOUNTER — Other Ambulatory Visit: Payer: Self-pay | Admitting: Adult Health

## 2021-01-30 DIAGNOSIS — I10 Essential (primary) hypertension: Secondary | ICD-10-CM

## 2021-01-31 ENCOUNTER — Other Ambulatory Visit: Payer: Self-pay

## 2021-01-31 ENCOUNTER — Ambulatory Visit (AMBULATORY_SURGERY_CENTER): Payer: PPO

## 2021-01-31 ENCOUNTER — Telehealth: Payer: Self-pay

## 2021-01-31 ENCOUNTER — Encounter: Payer: Self-pay | Admitting: Internal Medicine

## 2021-01-31 VITALS — Ht 69.0 in | Wt 254.0 lb

## 2021-01-31 DIAGNOSIS — Z1211 Encounter for screening for malignant neoplasm of colon: Secondary | ICD-10-CM

## 2021-01-31 DIAGNOSIS — I10 Essential (primary) hypertension: Secondary | ICD-10-CM

## 2021-01-31 MED ORDER — AMLODIPINE BESYLATE 5 MG PO TABS: 5.0000 mg | ORAL_TABLET | Freq: Every day | ORAL | 3 refills | Status: DC

## 2021-01-31 NOTE — Telephone Encounter (Signed)
This has been taking care of.

## 2021-01-31 NOTE — Telephone Encounter (Signed)
Patient called requesting Rx refill amLODipine (NORVASC) 5 MG tablet

## 2021-01-31 NOTE — Progress Notes (Signed)
Pre visit completed via phone call; Patient verified name, DOB, and address; No egg or soy allergy known to patient  No issues known to pt with past sedation with any surgeries or procedures Patient denies ever being told they had issues or difficulty with intubation  No FH of Malignant Hyperthermia Pt is not on diet pills Pt is not on home 02  Pt is not on blood thinners  Pt denies issues with constipation  No A fib or A flutter Pt is fully vaccinated for Covid x 2;  Due to the COVID-19 pandemic we are asking patients to follow certain guidelines in PV and the [facility]   Pt aware of COVID protocols and [facility] guidelines   Patient to pick up packet with instructions and coupon from the 2nd floor;   Patient reports he still has the Sutab prep from his last PV but that he had to cancel his procedure due to his wife being sick and having surgery- RX for Sutab not sent in during this PV appt

## 2021-02-04 ENCOUNTER — Encounter (HOSPITAL_COMMUNITY): Payer: Self-pay | Admitting: *Deleted

## 2021-02-04 ENCOUNTER — Ambulatory Visit (HOSPITAL_COMMUNITY)
Admission: EM | Admit: 2021-02-04 | Discharge: 2021-02-04 | Disposition: A | Discharge status: Home / Self Care | Payer: PPO | Attending: Emergency Medicine | Admitting: Emergency Medicine

## 2021-02-04 ENCOUNTER — Other Ambulatory Visit: Payer: Self-pay

## 2021-02-04 DIAGNOSIS — Z20822 Contact with and (suspected) exposure to covid-19: Secondary | ICD-10-CM | POA: Insufficient documentation

## 2021-02-04 DIAGNOSIS — J069 Acute upper respiratory infection, unspecified: Secondary | ICD-10-CM | POA: Diagnosis not present

## 2021-02-04 LAB — POC INFLUENZA A AND B ANTIGEN (URGENT CARE ONLY)
INFLUENZA A ANTIGEN, POC: NEGATIVE
INFLUENZA B ANTIGEN, POC: NEGATIVE

## 2021-02-04 MED ORDER — BENZONATATE 100 MG PO CAPS: 100.0000 mg | ORAL_CAPSULE | Freq: Three times a day (TID) | ORAL | 0 refills | Status: DC

## 2021-02-04 NOTE — ED Triage Notes (Signed)
Pt  reports RT ear pain ,cough and sore throat that started yesterday.

## 2021-02-04 NOTE — ED Provider Notes (Signed)
[facility]    CSN: [id number] Arrival date & time: 02/04/21  1017      History   Chief Complaint Chief Complaint  Patient presents with   Shortness of Breath   Cough   Otalgia    Rt    HPI David Vincent is a 70 y.o. male.   Patient here for evaluation of right ear pain, cough, and sore throat.  Denies any fevers at home.  Has not taken any OTC medications or treatments.  Patient reports that he is vaccinated against COVID.  Reports son recently had a URI but was negative for COVID.  Patient also did get his flu shot approximately 2 weeks ago.  Denies any trauma, injury, or other precipitating event.  Denies any specific alleviating or aggravating factors.  Denies any fevers, chest pain, shortness of breath, N/V/D, numbness, tingling, weakness, abdominal pain, or headaches.    The history is provided by the patient.  Shortness of Breath Associated symptoms: cough and ear pain   Cough Associated symptoms: ear pain and shortness of breath   Otalgia Associated symptoms: cough    Past Medical History:  Diagnosis Date   BACK PAIN 03/10/2007   CAD (coronary artery disease)    A.  01/05/2002 Cath - nonobs dzs (LAD 20prox, RI 30-40 prox)   ED (erectile dysfunction)    GERD (gastroesophageal reflux disease)    History of mumps orchitis    Hyperlipidemia    HYPERTENSION 11/15/2006   INGUINAL HERNIA, RIGHT 12/05/2009   LEG CRAMPS 12/03/2008   SLEEP APNEA 11/16/2008   Sleep apnea    no cpap    Unstable angina (HCC)    VERTIGO, POSITIONAL 08/08/2009    Patient Active Problem List   Diagnosis Date Noted   Low testosterone 08/24/2014   Erectile dysfunction due to diseases classified elsewhere 08/24/2014   Multiple fractures of ribs of left side 01/01/2012   GERD (gastroesophageal reflux disease) 05/03/2011   CAD (coronary artery disease)    Unstable angina (HCC)    INGUINAL HERNIA, RIGHT 12/05/2009   VERTIGO, POSITIONAL 08/08/2009   Sleep apnea 11/16/2008    Essential hypertension 11/15/2006    Past Surgical History:  Procedure Laterality Date   CARDIAC CATHETERIZATION     COLONOSCOPY  2011   F/V-tics/normal- 10 yr recall   FOOT SURGERY Right 2019   KNEE ARTHROSCOPY Right    SHOULDER SURGERY Bilateral        Home Medications    Prior to Admission medications   Medication Sig Start Date End Date Taking? Authorizing Provider  benzonatate (TESSALON) 100 MG capsule Take 1 capsule (100 mg total) by mouth every 8 (eight) hours. 02/04/21  Yes Ivette Loyal, NP  acetaminophen (TYLENOL) 500 MG tablet Take 500 mg by mouth every 6 (six) hours as needed.    [provider]  amLODipine (NORVASC) 5 MG tablet Take 1 tablet (5 mg total) by mouth daily. 01/31/21   Nafziger, Kandee Keen, NP  atorvastatin (LIPITOR) 20 MG tablet Take 1 tablet by mouth once daily 12/13/20   Nafziger, Kandee Keen, NP  EPINEPHrine 0.3 mg/0.3 mL IJ SOAJ injection Inject 0.3 mg into the muscle as needed for anaphylaxis. 09/30/20   Wynn Banker, MD  erythromycin ophthalmic ointment Place 1 application into the right eye 2 (two) times daily. 01/11/21   [provider]  losartan (COZAAR) 100 MG tablet Take 1 tablet by mouth once daily 01/30/21   [name], NP  meclizine (ANTIVERT) 25 MG tablet  Take 1 tablet (25 mg total) by mouth 3 (three) times daily as needed for dizziness. 01/27/20   Nafziger, Kandee Keen, NP  mupirocin ointment (BACTROBAN) 2 % Apply 1 application topically 3 (three) times daily. 08/16/19   Domenick Gong, MD  omeprazole (PRILOSEC) 20 MG capsule Take 1 capsule by mouth once daily in the morning 01/30/21   Nafziger, Kandee Keen, NP  predniSONE (DELTASONE) 10 MG tablet Take 2 tabs for 7 days and then 1 tab for 7 days 01/27/21   [name], NP  Sodium Sulfate-Mag Sulfate-KCl (SUTAB) [phone] MG TABS Take 24 tablets by mouth as directed. MANUFACTURER CODES!! BIN: [id number] PCN: CN GROUP: [id number] MEMBER ID: [id number];[employer] AS SECONDARY INSURANCE ;NO PRIOR  AUTHORIZATION 04/12/20   Hilarie Fredrickson, MD  tamsulosin (FLOMAX) 0.4 MG CAPS capsule Take 1 capsule (0.4 mg total) by mouth daily. 11/30/20   Nafziger, Kandee Keen, NP    Family History Family History  Problem Relation Age of Onset   Pneumonia Mother        died 60 - hip fx/pna   Heart attack Father        died 10   Hypertension Sister        alive   Cancer Brother        died - agent orange exposure   Hypertension Brother        alive   Colon cancer Neg Hx    Colon polyps Neg Hx    Esophageal cancer Neg Hx    Rectal cancer Neg Hx    Stomach cancer Neg Hx     Social History Social History   Tobacco Use   Smoking status: Never   Smokeless tobacco: Never  Vaping Use   Vaping Use: Never used  Substance Use Topics   Alcohol use: No   Drug use: No     Allergies   Bee venom, Gabapentin, Codeine phosphate, and Penicillins   Review of Systems Review of Systems  HENT:  Positive for ear pain.   Respiratory:  Positive for cough and shortness of breath.   All other systems reviewed and are negative.   Physical Exam Triage Vital Signs ED Triage Vitals  Enc Vitals Group     BP 02/04/21 1048 126/63     Pulse Rate 02/04/21 1048 60     Resp 02/04/21 1048 20     Temp 02/04/21 1048 98.7 F (37.1 C)     Temp src --      SpO2 02/04/21 1048 96 %     Weight --      Height --      Head Circumference --      Peak Flow --      Pain Score 02/04/21 1050 7     Pain Loc --      Pain Edu? --      Excl. in GC? --    No data found.  Updated Vital Signs BP 126/63   Pulse 60   Temp 98.7 F (37.1 C)   Resp 20   SpO2 96%   Visual Acuity Right Eye Distance:   Left Eye Distance:   Bilateral Distance:    Right Eye Near:   Left Eye Near:    Bilateral Near:     Physical Exam Vitals and nursing note reviewed.  Constitutional:      General: He is not in acute distress.    Appearance: Normal appearance. He is not ill-appearing, toxic-appearing or diaphoretic.  HENT:     Head:  Normocephalic and atraumatic.     Right Ear: Tympanic membrane, ear canal and external ear normal.     Left Ear: Tympanic membrane, ear canal and external ear normal.     Nose: Congestion present. No rhinorrhea.     Mouth/Throat:     Pharynx: Pharyngeal swelling and posterior oropharyngeal erythema present.     Tonsils: No tonsillar exudate. 1+ on the right. 1+ on the left.  Eyes:     Extraocular Movements: Extraocular movements intact.     Conjunctiva/sclera: Conjunctivae normal.     Pupils: Pupils are equal, round, and reactive to light.  Cardiovascular:     Rate and Rhythm: Normal rate and regular rhythm.     Pulses: Normal pulses.     Heart sounds: Normal heart sounds.  Pulmonary:     Effort: Pulmonary effort is normal.     Breath sounds: Normal breath sounds.  Abdominal:     General: Abdomen is flat.  Musculoskeletal:        General: Normal range of motion.     Cervical back: Normal range of motion.  Skin:    General: Skin is warm and dry.  Neurological:     General: No focal deficit present.     Mental Status: He is alert and oriented to person, place, and time.  Psychiatric:        Mood and Affect: Mood normal.     UC Treatments / Results  Labs (all labs ordered are listed, but only abnormal results are displayed) Labs Reviewed  SARS CORONAVIRUS 2 (TAT 6-24 HRS)  POC INFLUENZA A AND B ANTIGEN (URGENT CARE ONLY)    EKG   Radiology No results found.  Procedures Procedures (including critical care time)  Medications Ordered in UC Medications - No data to display  Initial Impression / Assessment and Plan / UC Course  I have reviewed the triage vital signs and the nursing notes.  Pertinent labs & imaging results that were available during my care of the patient were reviewed by me and considered in my medical decision making (see chart for details).    Assessment negative for red flags or concerns.  This is likely a viral cough.  COVID and flu test  pending.  May take [facility] as needed for cough.  Tylenol and/or ibuprofen as needed.  Discussed concerning symptom management as described in discharge instructions.  Encourage fluids and rest.  Follow-up with primary care as needed for reevaluation. Final Clinical Impressions(s) / UC Diagnoses   Final diagnoses:  Viral URI with cough     Discharge Instructions      We will contact you if your COVID or flu test is positive.  You can take the [facility] as needed for cough.  You can take Tylenol and/or Ibuprofen as needed for fever reduction and pain relief.   For cough: honey 1/2 to 1 teaspoon (you can dilute the honey in water or another fluid).  You can also use guaifenesin and dextromethorphan for cough. You can use a humidifier for chest congestion and cough.  If you don't have a humidifier, you can sit in the bathroom with the hot shower running.     For sore throat: try warm salt water gargles, cepacol lozenges, throat spray, warm tea or water with lemon/honey, popsicles or ice, or OTC cold relief medicine for throat discomfort.    For congestion: take a daily anti-histamine like Zyrtec, Claritin, and a oral decongestant, such as pseudoephedrine.  You can also  use Flonase 1-2 sprays in each nostril daily.    It is important to stay hydrated: drink plenty of fluids (water, gatorade/powerade/pedialyte, juices, or teas) to keep your throat moisturized and help further relieve irritation/discomfort.   Return or go to the Emergency Department if symptoms worsen or do not improve in the next few days.      ED Prescriptions     Medication Sig Dispense Auth. Provider   benzonatate (TESSALON) 100 MG capsule Take 1 capsule (100 mg total) by mouth every 8 (eight) hours. 21 capsule Ivette Loyal, NP      PDMP not reviewed this encounter.   Ivette Loyal, NP 02/04/21 1149

## 2021-02-04 NOTE — Discharge Instructions (Signed)
We will contact you if your COVID or flu test is positive.  You can take the [facility] as needed for cough.  You can take Tylenol and/or Ibuprofen as needed for fever reduction and pain relief.   For cough: honey 1/2 to 1 teaspoon (you can dilute the honey in water or another fluid).  You can also use guaifenesin and dextromethorphan for cough. You can use a humidifier for chest congestion and cough.  If you don't have a humidifier, you can sit in the bathroom with the hot shower running.     For sore throat: try warm salt water gargles, cepacol lozenges, throat spray, warm tea or water with lemon/honey, popsicles or ice, or OTC cold relief medicine for throat discomfort.    For congestion: take a daily anti-histamine like Zyrtec, Claritin, and a oral decongestant, such as pseudoephedrine.  You can also use Flonase 1-2 sprays in each nostril daily.    It is important to stay hydrated: drink plenty of fluids (water, gatorade/powerade/pedialyte, juices, or teas) to keep your throat moisturized and help further relieve irritation/discomfort.   Return or go to the Emergency Department if symptoms worsen or do not improve in the next few days.

## 2021-02-05 LAB — SARS CORONAVIRUS 2 (TAT 6-24 HRS): SARS Coronavirus 2: NEGATIVE

## 2021-02-06 ENCOUNTER — Telehealth (INDEPENDENT_AMBULATORY_CARE_PROVIDER_SITE_OTHER): Payer: PPO | Admitting: Family Medicine

## 2021-02-06 ENCOUNTER — Encounter: Payer: Self-pay | Admitting: Family Medicine

## 2021-02-06 VITALS — Ht 69.0 in

## 2021-02-06 DIAGNOSIS — J069 Acute upper respiratory infection, unspecified: Secondary | ICD-10-CM

## 2021-02-06 NOTE — Progress Notes (Signed)
Virtual Visit via Telephone Note I connected with David Vincent on 02/06/21 at  4:30 PM EDT by telephone and verified that I am speaking with the correct person using two identifiers.   I discussed the limitations, risks, security and privacy concerns of performing an evaluation and management service by telephone and the availability of in person appointments. I also discussed with the patient that there may be a patient responsible charge related to this service. The patient expressed understanding and agreed to proceed.  Location patient: home Location provider: work office Participants present for the call: patient, provider Patient did not have a visit in the prior 7 days to address this/these issue(s).  Chief Complaint  Patient presents with   Sore Throat        History of Present Illness: David Vincent is a 70 y.o.male with hx of CAD, hypertension, GERD, and low testosterone complaining of 3 to 4 days of respiratory symptoms. Started with fatigue, nasal congestion, rhinorrhea, sore throat, right ear ache, and productive cough. Yellowish sputum, denies hemoptysis.  Chest discomfort when coughing. Negative for fever, chills, body aches, anosmia, ageusia, wheezing, dyspnea, palpitations, abdominal pain, nausea, vomiting, changes in bowel habits, urinary symptoms, or skin rash. Negative for sick contacts or recent travel. COVID-19 vaccination and flu shot up-to-date.  He was already evaluated in the ED on 02/04/2021. Chest discomfort and headache have resolved. COVID-19 and flu test negative.  Benzonatate has helped with cough.  He is having colonoscopy on 02/08/21, he wonders if he needs to reschedule.  Observations/Objective: Patient sounds cheerful and well on the phone. I do not appreciate any SOB. Speech and thought processing are grossly intact. Patient reported vitals:Ht 5\' 9"  (1.753 m)   BMI 37.51 kg/m   Assessment and Plan:  1. URI, acute Some symptoms  have improved. Continue symptomatic treatment. Benzonatate for cough. Adequate hydration. Plain Mucinex may help with cough. Instructed to monitor for signs of complications, including new onset of fever among some, clearly instructed about warning signs. I also explained that cough and nasal congestion can last a few days and sometimes weeks. As far as he continues to be afebrile and Vincent new symptoms develop he can go ahead with colonoscopy as scheduled.  Follow Up Instructions:  Return if symptoms worsen or fail to improve.  I did not refer this patient for an OV in the next 24 hours for this/these issue(s).  I discussed the assessment and treatment plan with the patient. David Vincent was provided an opportunity to ask questions and all were answered. He agreed with the plan and demonstrated an understanding of the instructions.   The patient was advised to call back or seek an in-person evaluation if the symptoms worsen or if the condition fails to improve as anticipated.  I provided  6 minutes of non-face-to-face time during this encounter.

## 2021-02-08 ENCOUNTER — Encounter: Payer: Self-pay | Admitting: Internal Medicine

## 2021-02-08 ENCOUNTER — Ambulatory Visit (AMBULATORY_SURGERY_CENTER): Payer: PPO | Admitting: Internal Medicine

## 2021-02-08 VITALS — BP 116/64 | HR 56 | Temp 97.9°F | Resp 16 | Ht 69.0 in | Wt 254.0 lb

## 2021-02-08 DIAGNOSIS — Z1211 Encounter for screening for malignant neoplasm of colon: Secondary | ICD-10-CM

## 2021-02-08 MED ORDER — SODIUM CHLORIDE 0.9 % IV SOLN: 500.0000 mL | INTRAVENOUS | Status: DC

## 2021-02-08 NOTE — Progress Notes (Signed)
HISTORY OF PRESENT ILLNESS:  David Vincent is a 70 y.o. male who presents today for screening colonoscopy.  Index examination October 2011 was negative for neoplasia.  No active complaints  REVIEW OF SYSTEMS:  All non-GI ROS negative.  Past Medical History:  Diagnosis Date   BACK PAIN 03/10/2007   CAD (coronary artery disease)    A.  01/05/2002 Cath - nonobs dzs (LAD 20prox, RI 30-40 prox)   ED (erectile dysfunction)    GERD (gastroesophageal reflux disease)    History of mumps orchitis    Hyperlipidemia    HYPERTENSION 11/15/2006   INGUINAL HERNIA, RIGHT 12/05/2009   LEG CRAMPS 12/03/2008   SLEEP APNEA 11/16/2008   Sleep apnea    no cpap    Unstable angina (HCC)    VERTIGO, POSITIONAL 08/08/2009    Past Surgical History:  Procedure Laterality Date   CARDIAC CATHETERIZATION     COLONOSCOPY  2011   F/V-tics/normal- 10 yr recall   FOOT SURGERY Right 2019   KNEE ARTHROSCOPY Right    SHOULDER SURGERY Bilateral     Social History AIMEE TIMMONS  reports that he has never smoked. He has never used smokeless tobacco. He reports that he does not drink alcohol and does not use drugs.  family history includes Cancer in his brother; Heart attack in his father; Hypertension in his brother and sister; Pneumonia in his mother.  Allergies  Allergen Reactions   Bee Venom Anaphylaxis   Gabapentin Other (See Comments)    Hallucinations    Codeine Phosphate Itching   Penicillins Other (See Comments)    Has patient had a PCN reaction causing immediate rash, facial/tongue/throat swelling, SOB or lightheadedness with hypotension: Unknown, childhood reaction Has patient had a PCN reaction causing severe rash involving mucus membranes or skin necrosis: No Has patient had a PCN reaction that required hospitalization No Has patient had a PCN reaction occurring within the last 10 years: No If all of the above answers are "NO", then may proceed with Cephalosporin use.        PHYSICAL  EXAMINATION:  Vital signs: BP (!) 146/85   Pulse 60   Temp 97.9 F (36.6 C) (Temporal)   Resp (!) 24   Ht 5\' 9"  (1.753 m)   Wt 254 lb (115.2 kg)   SpO2 100%   BMI 37.51 kg/m  General: Well-developed, well-nourished, no acute distress HEENT: Sclerae are anicteric, conjunctiva pink. Oral mucosa intact Lungs: Clear Heart: Regular Abdomen: soft, nontender, nondistended, no obvious ascites, no peritoneal signs, normal bowel sounds. No organomegaly. Extremities: No edema Psychiatric: alert and oriented x3. Cooperative     ASSESSMENT:  1.  Colon cancer screening.  Average risk   PLAN:   1.  Screening colonoscopy

## 2021-02-08 NOTE — Progress Notes (Signed)
A and O x3. Report to RN. Tolerated MAC anesthesia well. 

## 2021-02-08 NOTE — Op Note (Signed)
[facility] Patient Name: David Vincent Procedure Date: 02/08/2021 1:57 PM MRN: [id number] Endoscopist: Wilhemina Bonito. Marina Goodell , MD Age: 70 Referring MD:  Date of Birth: 05-29-1950 Gender: Male Account #: [id number] Procedure:                Colonoscopy Indications:              Screening for colorectal malignant neoplasm. Index                            examination October 2011 was negative for neoplasia Medicines:                Monitored Anesthesia Care Procedure:                Pre-Anesthesia Assessment:                           - Prior to the procedure, a History and Physical                            was performed, and patient medications and                            allergies were reviewed. The patient's tolerance of                            previous anesthesia was also reviewed. The risks                            and benefits of the procedure and the sedation                            options and risks were discussed with the patient.                            All questions were answered, and informed consent                            was obtained. Prior Anticoagulants: The patient has                            taken no previous anticoagulant or antiplatelet                            agents. ASA Grade Assessment: II - A patient with                            mild systemic disease. After reviewing the risks                            and benefits, the patient was deemed in                            satisfactory condition to undergo the procedure.  After obtaining informed consent, the colonoscope                            was passed under direct vision. Throughout the                            procedure, the patient's blood pressure, pulse, and                            oxygen saturations were monitored continuously. The                            CF [id number] #[id number] was introduced through the anus                            and  advanced to the the cecum, identified by                            appendiceal orifice and ileocecal valve. The                            ileocecal valve, appendiceal orifice, and rectum                            were photographed. The quality of the bowel                            preparation was excellent. The colonoscopy was                            performed without difficulty. The patient tolerated                            the procedure well. The bowel preparation used was                            SUPREP/tablets via split dose instruction. Scope In: 2:07:05 PM Scope Out: 2:18:21 PM Scope Withdrawal Time: 0 hours 10 minutes 9 seconds  Total Procedure Duration: 0 hours 11 minutes 16 seconds  Findings:                 Multiple small and large-mouthed diverticula were                            found in the entire colon.                           The exam was otherwise without abnormality on                            direct and retroflexion views. Complications:            No immediate complications. Estimated blood loss:  None. Estimated Blood Loss:     Estimated blood loss: none. Impression:               - Diverticulosis in the entire examined colon.                           - The examination was otherwise normal on direct                            and retroflexion views.                           - No specimens collected. Recommendation:           - Repeat colonoscopy is not recommended for                            screening purposes.                           - Patient has a contact number available for                            emergencies. The signs and symptoms of potential                            delayed complications were discussed with the                            patient. Return to normal activities tomorrow.                            Written discharge instructions were provided to the                            patient.                            - Resume previous diet.                           - Continue present medications. Wilhemina Bonito. Marina Goodell, MD 02/08/2021 2:24:05 PM This report has been signed electronically.

## 2021-02-08 NOTE — Progress Notes (Signed)
Pt's states no medical or surgical changes since previsit or office visit. 

## 2021-02-08 NOTE — Patient Instructions (Signed)
Please read handouts provided. Continue present medications.   YOU HAD AN ENDOSCOPIC PROCEDURE TODAY AT THE [facility]:   Refer to the procedure report that was given to you for any specific questions about what was found during the examination.  If the procedure report does not answer your questions, please call your gastroenterologist to clarify.  If you requested that your care partner not be given the details of your procedure findings, then the procedure report has been included in a sealed envelope for you to review at your convenience later.  YOU SHOULD EXPECT: Some feelings of bloating in the abdomen. Passage of more gas than usual.  Walking can help get rid of the air that was put into your GI tract during the procedure and reduce the bloating. If you had a lower endoscopy (such as a colonoscopy or flexible sigmoidoscopy) you may notice spotting of blood in your stool or on the toilet paper. If you underwent a bowel prep for your procedure, you may not have a normal bowel movement for a few days.  Please Note:  You might notice some irritation and congestion in your nose or some drainage.  This is from the oxygen used during your procedure.  There is no need for concern and it should clear up in a day or so.  SYMPTOMS TO REPORT IMMEDIATELY:  Following lower endoscopy (colonoscopy or flexible sigmoidoscopy):  Excessive amounts of blood in the stool  Significant tenderness or worsening of abdominal pains  Swelling of the abdomen that is new, acute  Fever of 100F or higher   For urgent or emergent issues, a gastroenterologist can be reached at any hour by calling [phone]. Do not use MyChart messaging for urgent concerns.    DIET:  We do recommend a small meal at first, but then you may proceed to your regular diet.  Drink plenty of fluids but you should avoid alcoholic beverages for 24 hours.  ACTIVITY:  You should plan to take it easy for the rest of today and  you should NOT DRIVE or use heavy machinery until tomorrow (because of the sedation medicines used during the test).    FOLLOW UP: Our staff will call the number listed on your records 48-72 hours following your procedure to check on you and address any questions or concerns that you may have regarding the information given to you following your procedure. If we do not reach you, we will leave a message.  We will attempt to reach you two times.  During this call, we will ask if you have developed any symptoms of COVID 19. If you develop any symptoms (ie: fever, flu-like symptoms, shortness of breath, cough etc.) before then, please call [phone].  If you test positive for Covid 19 in the 2 weeks post procedure, please call and report this information to us.    If any biopsies were taken you will be contacted by phone or by letter within the next 1-3 weeks.  Please call us at [phone] if you have not heard about the biopsies in 3 weeks.    SIGNATURES/CONFIDENTIALITY: You and/or your care partner have signed paperwork which will be entered into your electronic medical record.  These signatures attest to the fact that that the information above on your After Visit Summary has been reviewed and is understood.  Full responsibility of the confidentiality of this discharge information lies with you and/or your care-partner.  

## 2021-02-10 ENCOUNTER — Telehealth: Payer: Self-pay

## 2021-02-10 NOTE — Telephone Encounter (Signed)
  Follow up Call-  Call back number 02/08/2021  Post procedure Call Back phone  # [phone]  Permission to leave phone message Yes  Some recent data might be hidden     Patient questions:  Do you have a fever, pain , or abdominal swelling? No. Pain Score  0 *  Have you tolerated food without any problems? Yes.    Have you been able to return to your normal activities? Yes.    Do you have any questions about your discharge instructions: Diet   No. Medications  No. Follow up visit  No.  Do you have questions or concerns about your Care? No.  Actions: * If pain score is 4 or above: No action needed, pain <4.   Have you developed a fever since your procedure? no  2.   Have you had an respiratory symptoms (SOB or cough) since your procedure? no  3.   Have you tested positive for COVID 19 since your procedure no  4.   Have you had any family members/close contacts diagnosed with the COVID 19 since your procedure?  no   If yes to any of these questions please route to Laverna Peace, RN and Karlton Lemon, RN

## 2021-05-06 ENCOUNTER — Other Ambulatory Visit: Payer: Self-pay | Admitting: Adult Health

## 2021-07-04 DIAGNOSIS — M25562 Pain in left knee: Secondary | ICD-10-CM | POA: Diagnosis not present

## 2021-07-04 DIAGNOSIS — M1712 Unilateral primary osteoarthritis, left knee: Secondary | ICD-10-CM | POA: Diagnosis not present

## 2021-07-24 ENCOUNTER — Other Ambulatory Visit: Payer: Self-pay | Admitting: Adult Health

## 2021-07-30 ENCOUNTER — Other Ambulatory Visit: Payer: Self-pay | Admitting: Adult Health

## 2021-07-31 DIAGNOSIS — M25562 Pain in left knee: Secondary | ICD-10-CM | POA: Diagnosis not present

## 2021-08-11 DIAGNOSIS — M25562 Pain in left knee: Secondary | ICD-10-CM | POA: Diagnosis not present

## 2021-08-17 DIAGNOSIS — M25562 Pain in left knee: Secondary | ICD-10-CM | POA: Diagnosis not present

## 2021-08-17 DIAGNOSIS — S83242A Other tear of medial meniscus, current injury, left knee, initial encounter: Secondary | ICD-10-CM | POA: Diagnosis not present

## 2021-08-17 DIAGNOSIS — M1712 Unilateral primary osteoarthritis, left knee: Secondary | ICD-10-CM | POA: Diagnosis not present

## 2021-08-18 ENCOUNTER — Ambulatory Visit (INDEPENDENT_AMBULATORY_CARE_PROVIDER_SITE_OTHER): Payer: PPO | Admitting: Adult Health

## 2021-08-18 ENCOUNTER — Encounter: Payer: Self-pay | Admitting: Adult Health

## 2021-08-18 VITALS — BP 112/62 | HR 57 | Temp 98.0°F | Ht 69.0 in | Wt 253.0 lb

## 2021-08-18 DIAGNOSIS — G4762 Sleep related leg cramps: Secondary | ICD-10-CM

## 2021-08-18 LAB — COMPREHENSIVE METABOLIC PANEL
ALT: 15 U/L (ref 0–53)
AST: 20 U/L (ref 0–37)
Albumin: 4.5 g/dL (ref 3.5–5.2)
Alkaline Phosphatase: 76 U/L (ref 39–117)
BUN: 18 mg/dL (ref 6–23)
CO2: 28 mEq/L (ref 19–32)
Calcium: 9.7 mg/dL (ref 8.4–10.5)
Chloride: 104 mEq/L (ref 96–112)
Creatinine, Ser: 1.12 mg/dL (ref 0.40–1.50)
GFR: 66.48 mL/min (ref >60.00)
Glucose, Bld: 95 mg/dL (ref 70–99)
Potassium: 4 mEq/L (ref 3.5–5.1)
Sodium: 140 mEq/L (ref 135–145)
Total Bilirubin: 0.8 mg/dL (ref 0.2–1.2)
Total Protein: 7.1 g/dL (ref 6.0–8.3)

## 2021-08-18 LAB — IBC + FERRITIN
Ferritin: 87.1 ng/mL (ref 22.0–322.0)
Iron: 97 ug/dL (ref 42–165)
Saturation Ratios: 33.5 % (ref 20.0–50.0)
TIBC: 289.8 ug/dL (ref 250.0–450.0)
Transferrin: 207 mg/dL — ABNORMAL LOW (ref 212.0–360.0)

## 2021-08-18 LAB — VITAMIN B12: Vitamin B-12: 498 pg/mL (ref 211–911)

## 2021-08-18 LAB — VITAMIN D 25 HYDROXY (VIT D DEFICIENCY, FRACTURES): VITD: 28.7 ng/mL — ABNORMAL LOW (ref 30.00–100.00)

## 2021-08-18 NOTE — Progress Notes (Signed)
? ?Subjective:  ? ? Patient ID: David Vincent, male    DOB: [birth year], 71 y.o.   MRN: [id number] ? ?HPI ?71 year old male who  has a past medical history of BACK PAIN (03/10/2007), CAD (coronary artery disease), ED (erectile dysfunction), GERD (gastroesophageal reflux disease), History of mumps orchitis, Hyperlipidemia, HYPERTENSION (11/15/2006), INGUINAL HERNIA, RIGHT (12/05/2009), LEG CRAMPS (12/03/2008), SLEEP APNEA (11/16/2008), Sleep apnea, Unstable angina (HCC), and VERTIGO, POSITIONAL (08/08/2009). ? ?He presents to the office today for an acute concern of leg cramps in his upper legs that has been going on for multiple years but have been getting worse. Happens mostly at night. ? ?He has been drinking tonic water which helps and eating bananas and staying hydrated.  ? ?Review of Systems ?See HPI  ? ?Past Medical History:  ?Diagnosis Date  ? BACK PAIN 03/10/2007  ? CAD (coronary artery disease)   ? A.  01/05/2002 Cath - nonobs dzs (LAD 20prox, RI 30-40 prox)  ? ED (erectile dysfunction)   ? GERD (gastroesophageal reflux disease)   ? History of mumps orchitis   ? Hyperlipidemia   ? HYPERTENSION 11/15/2006  ? INGUINAL HERNIA, RIGHT 12/05/2009  ? LEG CRAMPS 12/03/2008  ? SLEEP APNEA 11/16/2008  ? Sleep apnea   ? no cpap   ? Unstable angina (HCC)   ? VERTIGO, POSITIONAL 08/08/2009  ? ? ?Social History  ? ?Socioeconomic History  ? Marital status: Married  ?  Spouse name: Not on file  ? Number of children: Not on file  ? Years of education: Not on file  ? Highest education level: Not on file  ?Occupational History  ? Not on file  ?Tobacco Use  ? Smoking status: Never  ? Smokeless tobacco: Never  ?Vaping Use  ? Vaping Use: Never used  ?Substance and Sexual Activity  ? Alcohol use: No  ? Drug use: No  ? Sexual activity: Not on file  ?Other Topics Concern  ? Not on file  ?Social History Narrative  ? 05/02/2011:  Currently unemployed.  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  Does not exercise or adhere to any  specific diet.  ? ?Social Determinants of Health  ? ?Financial Resource Strain: Low Risk   ? Difficulty of Paying Living Expenses: Not hard at all  ?Food Insecurity: No Food Insecurity  ? Worried About Programme researcher, broadcasting/film/video in the Last Year: Never true  ? Ran Out of Food in the Last Year: Never true  ?Transportation Needs: No Transportation Needs  ? Lack of Transportation (Medical): No  ? Lack of Transportation (Non-Medical): No  ?Physical Activity: Sufficiently Active  ? Days of Exercise per Week: 5 days  ? Minutes of Exercise per Session: 60 min  ?Stress: No Stress Concern Present  ? Feeling of Stress : Not at all  ?Social Connections: Moderately Isolated  ? Frequency of Communication with Friends and Family: Twice a week  ? Frequency of Social Gatherings with Friends and Family: Twice a week  ? Attends Religious Services: Never  ? Active Member of Clubs or Organizations: No  ? Attends Banker Meetings: Never  ? Marital Status: Married  ?Intimate Partner Violence: Not At Risk  ? Fear of Current or Ex-Partner: No  ? Emotionally Abused: No  ? Physically Abused: No  ? Sexually Abused: No  ? ? ?Past Surgical History:  ?Procedure Laterality Date  ? CARDIAC CATHETERIZATION    ? COLONOSCOPY  2011  ?  F/V-tics/normal- 10 yr recall  ? FOOT SURGERY Right 2019  ? KNEE ARTHROSCOPY Right   ? SHOULDER SURGERY Bilateral   ? ? ?Family History  ?Problem Relation Age of Onset  ? Pneumonia Mother   ?     died 26 - hip fx/pna  ? Heart attack Father   ?     died 55  ? Hypertension Sister   ?     alive  ? Cancer Brother   ?     died - agent orange exposure  ? Hypertension Brother   ?     alive  ? Colon cancer Neg Hx   ? Colon polyps Neg Hx   ? Esophageal cancer Neg Hx   ? Rectal cancer Neg Hx   ? Stomach cancer Neg Hx   ? ? ?Allergies  ?Allergen Reactions  ? Bee Venom Anaphylaxis  ? Gabapentin Other (See Comments)  ?  Hallucinations   ? Codeine Phosphate Itching  ? Penicillins Other (See Comments)  ?  Has patient had a PCN  reaction causing immediate rash, facial/tongue/throat swelling, SOB or lightheadedness with hypotension: Unknown, childhood reaction ?Has patient had a PCN reaction causing severe rash involving mucus membranes or skin necrosis: No ?Has patient had a PCN reaction that required hospitalization No ?Has patient had a PCN reaction occurring within the last 10 years: No ?If all of the above answers are "NO", then may proceed with Cephalosporin use. ?  ? ? ?Current Outpatient Medications on File Prior to Visit  ?Medication Sig Dispense Refill  ? amLODipine (NORVASC) 5 MG tablet Take 1 tablet (5 mg total) by mouth daily. 90 tablet 3  ? atorvastatin (LIPITOR) 20 MG tablet Take 1 tablet by mouth once daily 90 tablet 0  ? losartan (COZAAR) 100 MG tablet Take 1 tablet by mouth once daily 90 tablet 1  ? meclizine (ANTIVERT) 25 MG tablet Take 1 tablet (25 mg total) by mouth 3 (three) times daily as needed for dizziness. 30 tablet 2  ? omeprazole (PRILOSEC) 20 MG capsule Take 1 capsule by mouth once daily in the morning 90 capsule 0  ? tamsulosin (FLOMAX) 0.4 MG CAPS capsule Take 1 capsule (0.4 mg total) by mouth daily. 90 capsule 1  ? EPINEPHrine 0.3 mg/0.3 mL IJ SOAJ injection Inject 0.3 mg into the muscle as needed for anaphylaxis. (Patient not taking: Reported on 08/18/2021) 1 each 5  ? ?No current facility-administered medications on file prior to visit.  ? ? ?BP 112/62   Pulse (!) 57   Temp 98 ?F (36.7 ?C) (Oral)   Ht 5\' 9"  (1.753 m)   Wt 253 lb (114.8 kg)   SpO2 98%   BMI 37.36 kg/m?  ? ? ?   ?Objective:  ? Physical Exam ?Vitals and nursing note reviewed.  ?Constitutional:   ?   Appearance: Normal appearance.  ?Musculoskeletal:     ?   General: Normal range of motion.  ?Skin: ?   General: Skin is warm and dry.  ?   Capillary Refill: Capillary refill takes less than 2 seconds.  ?Neurological:  ?   General: No focal deficit present.  ?   Mental Status: He is alert and oriented to person, place, and time.  ?Psychiatric:      ?   Mood and Affect: Mood normal.     ?   Behavior: Behavior normal.     ?   Thought Content: Thought content normal.     ?   Judgment:  Judgment normal.  ? ?   ?Assessment & Plan:  ?1. Nocturnal leg cramps ?- Stretching and can start a multi vitamin. Will check for deficiency  ?- Comprehensive metabolic panel; Future ?- VITAMIN D 25 Hydroxy (Vit-D Deficiency, Fractures); Future ?- Vitamin B12; Future ?- IBC + Ferritin; Future ? ?Shirline Freesory Ameia Morency, NP ? ?

## 2021-09-03 ENCOUNTER — Other Ambulatory Visit: Payer: Self-pay | Admitting: Adult Health

## 2021-09-05 DIAGNOSIS — M1712 Unilateral primary osteoarthritis, left knee: Secondary | ICD-10-CM | POA: Diagnosis not present

## 2021-09-12 DIAGNOSIS — M1712 Unilateral primary osteoarthritis, left knee: Secondary | ICD-10-CM | POA: Diagnosis not present

## 2021-09-20 DIAGNOSIS — M1712 Unilateral primary osteoarthritis, left knee: Secondary | ICD-10-CM | POA: Diagnosis not present

## 2021-09-29 ENCOUNTER — Ambulatory Visit (INDEPENDENT_AMBULATORY_CARE_PROVIDER_SITE_OTHER): Payer: PPO

## 2021-09-29 ENCOUNTER — Encounter: Payer: Self-pay | Admitting: Adult Health

## 2021-09-29 ENCOUNTER — Ambulatory Visit: Payer: PPO

## 2021-09-29 ENCOUNTER — Ambulatory Visit (INDEPENDENT_AMBULATORY_CARE_PROVIDER_SITE_OTHER): Payer: PPO | Admitting: Adult Health

## 2021-09-29 VITALS — BP 120/80 | HR 93 | Temp 98.5°F | Ht 69.0 in | Wt 251.0 lb

## 2021-09-29 DIAGNOSIS — M546 Pain in thoracic spine: Secondary | ICD-10-CM

## 2021-09-29 DIAGNOSIS — M25551 Pain in right hip: Secondary | ICD-10-CM

## 2021-09-29 DIAGNOSIS — M542 Cervicalgia: Secondary | ICD-10-CM | POA: Diagnosis not present

## 2021-09-29 DIAGNOSIS — Z043 Encounter for examination and observation following other accident: Secondary | ICD-10-CM | POA: Diagnosis not present

## 2021-09-29 DIAGNOSIS — R109 Unspecified abdominal pain: Secondary | ICD-10-CM | POA: Diagnosis not present

## 2021-09-29 MED ORDER — NAPROXEN 500 MG PO TABS
500.0000 mg | ORAL_TABLET | Freq: Two times a day (BID) | ORAL | 0 refills | Status: DC
Start: 2021-09-29 — End: 2021-12-20

## 2021-09-29 MED ORDER — CYCLOBENZAPRINE HCL 10 MG PO TABS: 10.0000 mg | ORAL_TABLET | Freq: Every day | ORAL | 0 refills | Status: DC

## 2021-09-29 NOTE — Progress Notes (Addendum)
Subjective:    Patient ID: David Vincent, male    DOB: 23-Sep-1950, 71 y.o.   MRN: [id number]  HPI  71 year old male who  has a past medical history of BACK PAIN (03/10/2007), CAD (coronary artery disease), ED (erectile dysfunction), GERD (gastroesophageal reflux disease), History of mumps orchitis, Hyperlipidemia, HYPERTENSION (11/15/2006), INGUINAL HERNIA, RIGHT (12/05/2009), LEG CRAMPS (12/03/2008), SLEEP APNEA (11/16/2008), Sleep apnea, Unstable angina (HCC), and VERTIGO, POSITIONAL (08/08/2009).   He presents to the office today for right sided back pain and right hip pain.  He reports three days ago he slipped on his wet deck and fell from a standing position onto his back.   He reports pain to his right flank when sleeping, cannot roll onto his right side and his back feels tight. He has discomfort to his right flank with deep breathing. He has pain to his right hip with walking.   Denies LOC, vision loss, headaches.    Review of Systems See HPI   Past Medical History:  Diagnosis Date   BACK PAIN 03/10/2007   CAD (coronary artery disease)    A.  01/05/2002 Cath - nonobs dzs (LAD 20prox, RI 30-40 prox)   ED (erectile dysfunction)    GERD (gastroesophageal reflux disease)    History of mumps orchitis    Hyperlipidemia    HYPERTENSION 11/15/2006   INGUINAL HERNIA, RIGHT 12/05/2009   LEG CRAMPS 12/03/2008   SLEEP APNEA 11/16/2008   Sleep apnea    no cpap    Unstable angina (HCC)    VERTIGO, POSITIONAL 08/08/2009    Social History   Socioeconomic History   Marital status: Married    Spouse name: Not on file   Number of children: Not on file   Years of education: Not on file   Highest education level: Not on file  Occupational History   Not on file  Tobacco Use   Smoking status: Never   Smokeless tobacco: Never  Vaping Use   Vaping Use: Never used  Substance and Sexual Activity   Alcohol use: No   Drug use: No   Sexual activity: Not on file  Other Topics Concern   Not on  file  Social History Narrative   05/02/2011:  Currently unemployed.  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  Does not exercise or adhere to any specific diet.   Social Determinants of Health   Financial Resource Strain: Low Risk  (01/18/2021)   Overall Financial Resource Strain (CARDIA)    Difficulty of Paying Living Expenses: Not hard at all  Food Insecurity: No Food Insecurity (01/18/2021)   Hunger Vital Sign    Worried About Running Out of Food in the Last Year: Never true    Ran Out of Food in the Last Year: Never true  Transportation Needs: No Transportation Needs (01/18/2021)   PRAPARE - Administrator, Civil Service (Medical): No    Lack of Transportation (Non-Medical): No  Physical Activity: Sufficiently Active (01/18/2021)   Exercise Vital Sign    Days of Exercise per Week: 5 days    Minutes of Exercise per Session: 60 min  Stress: No Stress Concern Present (01/18/2021)   [employer] of Occupational Health - Occupational Stress Questionnaire    Feeling of Stress : Not at all  Social Connections: Moderately Isolated (01/18/2021)   Social Connection and Isolation Panel [NHANES]    Frequency of Communication with Friends and Family: Twice a week  Frequency of Social Gatherings with Friends and Family: Twice a week    Attends Religious Services: Never    Database administrator or Organizations: No    Attends Banker Meetings: Never    Marital Status: Married  Catering manager Violence: Not At Risk (01/18/2021)   Humiliation, Afraid, Rape, and Kick questionnaire    Fear of Current or Ex-Partner: No    Emotionally Abused: No    Physically Abused: No    Sexually Abused: No    Past Surgical History:  Procedure Laterality Date   CARDIAC CATHETERIZATION     COLONOSCOPY  2011   F/V-tics/normal- 10 yr recall   FOOT SURGERY Right 2019   KNEE ARTHROSCOPY Right    SHOULDER SURGERY Bilateral     Family History  Problem  Relation Age of Onset   Pneumonia Mother        died 53 - hip fx/pna   Heart attack Father        died 38   Hypertension Sister        alive   Cancer Brother        died - agent orange exposure   Hypertension Brother        alive   Colon cancer Neg Hx    Colon polyps Neg Hx    Esophageal cancer Neg Hx    Rectal cancer Neg Hx    Stomach cancer Neg Hx     Allergies  Allergen Reactions   Bee Venom Anaphylaxis   Gabapentin Other (See Comments)    Hallucinations    Codeine Phosphate Itching   Penicillins Other (See Comments)    Has patient had a PCN reaction causing immediate rash, facial/tongue/throat swelling, SOB or lightheadedness with hypotension: Unknown, childhood reaction Has patient had a PCN reaction causing severe rash involving mucus membranes or skin necrosis: No Has patient had a PCN reaction that required hospitalization No Has patient had a PCN reaction occurring within the last 10 years: No If all of the above answers are "NO", then may proceed with Cephalosporin use.     Current Outpatient Medications on File Prior to Visit  Medication Sig Dispense Refill   amLODipine (NORVASC) 5 MG tablet Take 1 tablet (5 mg total) by mouth daily. 90 tablet 3   atorvastatin (LIPITOR) 20 MG tablet Take 1 tablet by mouth once daily 90 tablet 0   EPINEPHrine 0.3 mg/0.3 mL IJ SOAJ injection Inject 0.3 mg into the muscle as needed for anaphylaxis. 1 each 5   losartan (COZAAR) 100 MG tablet Take 1 tablet by mouth once daily 90 tablet 1   meclizine (ANTIVERT) 25 MG tablet Take 1 tablet (25 mg total) by mouth 3 (three) times daily as needed for dizziness. 30 tablet 2   omeprazole (PRILOSEC) 20 MG capsule Take 1 capsule by mouth once daily in the morning 90 capsule 0   tamsulosin (FLOMAX) 0.4 MG CAPS capsule Take 1 capsule (0.4 mg total) by mouth daily. 90 capsule 1   No current facility-administered medications on file prior to visit.    BP 120/80   Pulse 93   Temp 98.5 F (36.9  C) (Oral)   Ht 5\' 9"  (1.753 m)   Wt 251 lb (113.9 kg)   SpO2 98%   BMI 37.07 kg/m       Objective:   Physical Exam Vitals and nursing note reviewed.  Constitutional:      Appearance: Normal appearance.  Cardiovascular:     Rate  and Rhythm: Normal rate and regular rhythm.     Pulses: Normal pulses.     Heart sounds: Normal heart sounds.  Musculoskeletal:     Cervical back: Bony tenderness present. No swelling, deformity, spasms or crepitus. No pain with movement. Normal range of motion.     Thoracic back: Bony tenderness present.     Lumbar back: Normal.       Back:     Right hip: Bony tenderness present. No deformity or crepitus. Decreased range of motion. Normal strength.  Neurological:     Mental Status: He is alert.       Assessment & Plan:  1. Cervical spine pain  - DG Cervical Spine Complete; Future - cyclobenzaprine (FLEXERIL) 10 MG tablet; Take 1 tablet (10 mg total) by mouth at bedtime.  Dispense: 15 tablet; Refill: 0 - naproxen (NAPROSYN) 500 MG tablet; Take 1 tablet (500 mg total) by mouth 2 (two) times daily with a meal.  Dispense: 30 tablet; Refill: 0  2. Thoracic spine pain  - DG Thoracic Spine W/Swimmers; Future - cyclobenzaprine (FLEXERIL) 10 MG tablet; Take 1 tablet (10 mg total) by mouth at bedtime.  Dispense: 15 tablet; Refill: 0 - naproxen (NAPROSYN) 500 MG tablet; Take 1 tablet (500 mg total) by mouth 2 (two) times daily with a meal.  Dispense: 30 tablet; Refill: 0  3. Right flank pain  - DG Ribs Unilateral Right; Future - cyclobenzaprine (FLEXERIL) 10 MG tablet; Take 1 tablet (10 mg total) by mouth at bedtime.  Dispense: 15 tablet; Refill: 0 - naproxen (NAPROSYN) 500 MG tablet; Take 1 tablet (500 mg total) by mouth 2 (two) times daily with a meal.  Dispense: 30 tablet; Refill: 0  - DG Ribs Unilateral Right; Future - cyclobenzaprine (FLEXERIL) 10 MG tablet; Take 1 tablet (10 mg total) by mouth at bedtime.  Dispense: 15 tablet; Refill: 0 -  naproxen (NAPROSYN) 500 MG tablet; Take 1 tablet (500 mg total) by mouth 2 (two) times daily with a meal.  Dispense: 30 tablet; Refill: 0  4. Right hip pain  - DG Hip Unilat W OR W/O Pelvis 2-3 Views Right; Future  [name], NP Time of total for care on the day of the encounter 30 minutes. This includes time spent in both face-to-face and non-face-to-face activities including preparing for the visit, reviewing the chart, time spent with patient, evaluation and counseling patient/family/caregiver, coordinating care, and time spent documenting in the chart which was performed on the date of service (09/29/2021). Note: this excludes any time spent performing billable procedures or separate charges (such as time spent counseling smoking cessation); these charges are billed separately.

## 2021-10-04 ENCOUNTER — Telehealth: Payer: Self-pay | Admitting: Adult Health

## 2021-10-04 NOTE — Telephone Encounter (Signed)
Pt called to request the results of his xrays.  Please call Pt at [phone] Thank you.

## 2021-10-04 NOTE — Telephone Encounter (Signed)
Pt advised that David Vincent is out of the office and will result when he returns. Pt verbalized understanding.

## 2021-11-05 ENCOUNTER — Other Ambulatory Visit: Payer: Self-pay | Admitting: Adult Health

## 2021-11-28 ENCOUNTER — Other Ambulatory Visit: Payer: Self-pay | Admitting: Adult Health

## 2021-12-20 ENCOUNTER — Encounter: Payer: Self-pay | Admitting: Internal Medicine

## 2021-12-20 ENCOUNTER — Ambulatory Visit (INDEPENDENT_AMBULATORY_CARE_PROVIDER_SITE_OTHER): Payer: PPO | Admitting: Internal Medicine

## 2021-12-20 VITALS — BP 130/80 | HR 65 | Temp 98.1°F | Wt 259.6 lb

## 2021-12-20 DIAGNOSIS — Z23 Encounter for immunization: Secondary | ICD-10-CM | POA: Diagnosis not present

## 2021-12-20 DIAGNOSIS — M26609 Unspecified temporomandibular joint disorder, unspecified side: Secondary | ICD-10-CM

## 2021-12-20 MED ORDER — NAPROXEN 500 MG PO TABS: 500.0000 mg | ORAL_TABLET | Freq: Two times a day (BID) | ORAL | 0 refills | Status: AC

## 2021-12-20 NOTE — Progress Notes (Signed)
Established Patient Office Visit     CC/Reason for Visit: Right ear, neck, jaw pain  HPI: David Vincent is a 70 y.o. male who is coming in today for the above mentioned reasons.  This happened about a year ago and got better.  He is again complaining of sharp, significant pain of his right jaw and ear.  He has pain radiating to the right temporal area in the neck as well.  It hurts to open and close his mouth.  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  His jaw deviates to the right on opening.  Past Medical/Surgical History: Past Medical History:  Diagnosis Date   BACK PAIN 03/10/2007   CAD (coronary artery disease)    A.  01/05/2002 Cath - nonobs dzs (LAD 20prox, RI 30-40 prox)   ED (erectile dysfunction)    GERD (gastroesophageal reflux disease)    History of mumps orchitis    Hyperlipidemia    HYPERTENSION 11/15/2006   INGUINAL HERNIA, RIGHT 12/05/2009   LEG CRAMPS 12/03/2008   SLEEP APNEA 11/16/2008   Sleep apnea    no cpap    Unstable angina (HCC)    VERTIGO, POSITIONAL 08/08/2009    Past Surgical History:  Procedure Laterality Date   CARDIAC CATHETERIZATION     COLONOSCOPY  2011   F/V-tics/normal- 10 yr recall   FOOT SURGERY Right 2019   KNEE ARTHROSCOPY Right    SHOULDER SURGERY Bilateral     Social History:  reports that he has never smoked. He has never used smokeless tobacco. He reports that he does not drink alcohol and does not use drugs.  Allergies: Allergies  Allergen Reactions   Bee Venom Anaphylaxis   Gabapentin Other (See Comments)    Hallucinations    Codeine Phosphate Itching   Penicillins Other (See Comments)    Has patient had a PCN reaction causing immediate rash, facial/tongue/throat swelling, SOB or lightheadedness with hypotension: Unknown, childhood reaction Has patient had a PCN reaction causing severe rash involving mucus membranes or skin necrosis: No Has patient  had a PCN reaction that required hospitalization No Has patient had a PCN reaction occurring within the last 10 years: No If all of the above answers are "NO", then may proceed with Cephalosporin use.     Family History:  Family History  Problem Relation Age of Onset   Pneumonia Mother        died 12 - hip fx/pna   Heart attack Father        died 13   Hypertension Sister        alive   Cancer Brother        died - agent orange exposure   Hypertension Brother        alive   Colon cancer Neg Hx    Colon polyps Neg Hx    Esophageal cancer Neg Hx    Rectal cancer Neg Hx    Stomach cancer Neg Hx      Current Outpatient Medications:    amLODipine (NORVASC) 5 MG tablet, Take 1 tablet (5 mg total) by mouth daily., Disp: 90 tablet, Rfl: 3   atorvastatin (LIPITOR) 20 MG tablet, Take 1 tablet by mouth once daily, Disp: 90 tablet, Rfl: 1   cyclobenzaprine (FLEXERIL) 10 MG tablet, Take 1 tablet (10 mg total) by mouth at bedtime., Disp: 15 tablet, Rfl: 0   EPINEPHrine 0.3 mg/0.3 mL  IJ SOAJ injection, Inject 0.3 mg into the muscle as needed for anaphylaxis., Disp: 1 each, Rfl: 5   losartan (COZAAR) 100 MG tablet, Take 1 tablet by mouth once daily, Disp: 90 tablet, Rfl: 1   meclizine (ANTIVERT) 25 MG tablet, Take 1 tablet (25 mg total) by mouth 3 (three) times daily as needed for dizziness., Disp: 30 tablet, Rfl: 2   naproxen (NAPROSYN) 500 MG tablet, Take 1 tablet (500 mg total) by mouth 2 (two) times daily with a meal for 10 days., Disp: 20 tablet, Rfl: 0   omeprazole (PRILOSEC) 20 MG capsule, Take 1 capsule by mouth once daily in the morning, Disp: 90 capsule, Rfl: 0   tamsulosin (FLOMAX) 0.4 MG CAPS capsule, Take 1 capsule (0.4 mg total) by mouth daily., Disp: 90 capsule, Rfl: 1  Review of Systems:  Constitutional: Denies fever, chills, diaphoresis, appetite change and fatigue.  HEENT: Denies photophobia, eye pain, redness, hearing loss, congestion, sore throat, rhinorrhea, sneezing,  mouth sores, trouble swallowing, neck pain, neck stiffness and tinnitus.   Respiratory: Denies SOB, DOE, cough, chest tightness,  and wheezing.   Cardiovascular: Denies chest pain, palpitations and leg swelling.  Gastrointestinal: Denies nausea, vomiting, abdominal pain, diarrhea, constipation, blood in stool and abdominal distention.  Genitourinary: Denies dysuria, urgency, frequency, hematuria, flank pain and difficulty urinating.  Endocrine: Denies: hot or cold intolerance, sweats, changes in hair or nails, polyuria, polydipsia. Musculoskeletal: Denies myalgias, back pain, joint swelling, arthralgias and gait problem.  Skin: Denies pallor, rash and wound.  Neurological: Denies dizziness, seizures, syncope, weakness, light-headedness, numbness and headaches.  Hematological: Denies adenopathy. Easy bruising, personal or family bleeding history  Psychiatric/Behavioral: Denies suicidal ideation, mood changes, confusion, nervousness, sleep disturbance and agitation    Physical Exam: Vitals:   12/20/21 1429  BP: 130/80  Pulse: 65  Temp: 98.1 F (36.7 C)  TempSrc: Oral  SpO2: 98%  Weight: 259 lb 9.6 oz (117.8 kg)    Body mass index is 38.34 kg/m.   Constitutional: NAD, calm, comfortable Eyes: PERRL, lids and conjunctivae normal ENMT: Mucous membranes are moist. Posterior pharynx is erythematous clear of any exudate or lesions.  Poor dentition with enamel wasting. Tympanic membrane is pearly white, no erythema or bulging. Psychiatric: Normal judgment and insight. Alert and oriented x 3. Normal mood.    Impression and Plan:  TMJ (temporomandibular joint disorder) - Plan: naproxen (NAPROSYN) 500 MG tablet, Ambulatory referral to ENT  Needs flu shot - Plan: Flu Vaccine QUAD High Dose(Fluad)  -Sounds like a temporomandibular joint disorder. -We will prescribe naproxen 500 mg twice daily for 10 days and refer to ENT. 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 -Flu vaccine administered today.  Time spent:30 minutes reviewing chart, interviewing and examining patient and formulating plan of care.      Chaya Jan, MD [facility] at [facility]

## 2022-01-01 ENCOUNTER — Telehealth: Payer: Self-pay | Admitting: Adult Health

## 2022-01-01 NOTE — Telephone Encounter (Signed)
Left message for patient to call back and schedule Medicare Annual Wellness Visit (AWV) either virtually or in office. Left  my jabber number [phone]   Last AWV 01/18/21  please schedule at anytime with [facility] Nurse Health Advisor 1 or 2    

## 2022-01-09 ENCOUNTER — Encounter: Payer: Self-pay | Admitting: Adult Health

## 2022-01-09 ENCOUNTER — Ambulatory Visit (INDEPENDENT_AMBULATORY_CARE_PROVIDER_SITE_OTHER): Payer: PPO | Admitting: Adult Health

## 2022-01-09 VITALS — BP 136/84 | HR 73 | Temp 98.4°F | Wt 257.0 lb

## 2022-01-09 DIAGNOSIS — M26609 Unspecified temporomandibular joint disorder, unspecified side: Secondary | ICD-10-CM | POA: Diagnosis not present

## 2022-01-09 MED ORDER — CYCLOBENZAPRINE HCL 10 MG PO TABS: 10.0000 mg | ORAL_TABLET | Freq: Three times a day (TID) | ORAL | 0 refills | Status: DC | PRN

## 2022-01-09 MED ORDER — METHYLPREDNISOLONE 4 MG PO TBPK: ORAL_TABLET | ORAL | 0 refills | Status: DC

## 2022-01-09 NOTE — Progress Notes (Signed)
Subjective:    Patient ID: David Vincent, male    DOB: 12/18/1950, 71 y.o.   MRN: [id number]  HPI 71 year old male who  has a past medical history of BACK PAIN (03/10/2007), CAD (coronary artery disease), ED (erectile dysfunction), GERD (gastroesophageal reflux disease), History of mumps orchitis, Hyperlipidemia, HYPERTENSION (11/15/2006), INGUINAL HERNIA, RIGHT (12/05/2009), LEG CRAMPS (12/03/2008), SLEEP APNEA (11/16/2008), Sleep apnea, Unstable angina (HCC), and VERTIGO, POSITIONAL (08/08/2009).  He presents to the office today for follow-up regarding right jaw and ear pain.  He was seen by another provider about 2 and half weeks ago at which time he was reporting sharp significant pain that radiated from his right ear to his jaw.  It was hurting to open and close his mouth.  On exam it was noted to have significant enamel wasting especially in the lower right side.  Prescribed naproxen and referred to ear nose and throat for TMJ.  Ports that he has not heard anything from ear nose and throat yet and the naproxen did not help.  He continues to have a sharp stabbing pain when opening and closing his mouth especially painful with yawning and, and eating.    Review of Systems See HPI   Past Medical History:  Diagnosis Date   BACK PAIN 03/10/2007   CAD (coronary artery disease)    A.  01/05/2002 Cath - nonobs dzs (LAD 20prox, RI 30-40 prox)   ED (erectile dysfunction)    GERD (gastroesophageal reflux disease)    History of mumps orchitis    Hyperlipidemia    HYPERTENSION 11/15/2006   INGUINAL HERNIA, RIGHT 12/05/2009   LEG CRAMPS 12/03/2008   SLEEP APNEA 11/16/2008   Sleep apnea    no cpap    Unstable angina (HCC)    VERTIGO, POSITIONAL 08/08/2009    Social History   Socioeconomic History   Marital status: Married    Spouse name: Not on file   Number of children: Not on file   Years of education: Not on file   Highest education level: Not on file  Occupational History   Not on file   Tobacco Use   Smoking status: Never   Smokeless tobacco: Never  Vaping Use   Vaping Use: Never used  Substance and Sexual Activity   Alcohol use: No   Drug use: No   Sexual activity: Not on file  Other Topics Concern   Not on file  Social History Narrative   05/02/2011:  Currently unemployed.  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  Does not exercise or adhere to any specific diet.   Social Determinants of Health   Financial Resource Strain: Low Risk  (01/18/2021)   Overall Financial Resource Strain (CARDIA)    Difficulty of Paying Living Expenses: Not hard at all  Food Insecurity: No Food Insecurity (01/18/2021)   Hunger Vital Sign    Worried About Running Out of Food in the Last Year: Never true    Ran Out of Food in the Last Year: Never true  Transportation Needs: No Transportation Needs (01/18/2021)   PRAPARE - Administrator, Civil Service (Medical): No    Lack of Transportation (Non-Medical): No  Physical Activity: Sufficiently Active (01/18/2021)   Exercise Vital Sign    Days of Exercise per Week: 5 days    Minutes of Exercise per Session: 60 min  Stress: No Stress Concern Present (01/18/2021)   [employer] of Occupational Health -  Occupational Stress Questionnaire    Feeling of Stress : Not at all  Social Connections: Moderately Isolated (01/18/2021)   Social Connection and Isolation Panel [NHANES]    Frequency of Communication with Friends and Family: Twice a week    Frequency of Social Gatherings with Friends and Family: Twice a week    Attends Religious Services: Never    Marine scientist or Organizations: No    Attends Archivist Meetings: Never    Marital Status: Married  Human resources officer Violence: Not At Risk (01/18/2021)   Humiliation, Afraid, Rape, and Kick questionnaire    Fear of Current or Ex-Partner: No    Emotionally Abused: No    Physically Abused: No    Sexually Abused: No    Past Surgical  History:  Procedure Laterality Date   CARDIAC CATHETERIZATION     COLONOSCOPY  2011   F/V-tics/normal- 10 yr recall   FOOT SURGERY Right 2019   KNEE ARTHROSCOPY Right    SHOULDER SURGERY Bilateral     Family History  Problem Relation Age of Onset   Pneumonia Mother        died 41 - hip fx/pna   Heart attack Father        died 45   Hypertension Sister        alive   Cancer Brother        died - agent orange exposure   Hypertension Brother        alive   Colon cancer Neg Hx    Colon polyps Neg Hx    Esophageal cancer Neg Hx    Rectal cancer Neg Hx    Stomach cancer Neg Hx     Allergies  Allergen Reactions   Bee Venom Anaphylaxis   Gabapentin Other (See Comments)    Hallucinations    Codeine Phosphate Itching   Penicillins Other (See Comments)    Has patient had a PCN reaction causing immediate rash, facial/tongue/throat swelling, SOB or lightheadedness with hypotension: Unknown, childhood reaction Has patient had a PCN reaction causing severe rash involving mucus membranes or skin necrosis: No Has patient had a PCN reaction that required hospitalization No Has patient had a PCN reaction occurring within the last 10 years: No If all of the above answers are "NO", then may proceed with Cephalosporin use.     Current Outpatient Medications on File Prior to Visit  Medication Sig Dispense Refill   amLODipine (NORVASC) 5 MG tablet Take 1 tablet (5 mg total) by mouth daily. 90 tablet 3   atorvastatin (LIPITOR) 20 MG tablet Take 1 tablet by mouth once daily 90 tablet 1   cyclobenzaprine (FLEXERIL) 10 MG tablet Take 1 tablet (10 mg total) by mouth at bedtime. 15 tablet 0   EPINEPHrine 0.3 mg/0.3 mL IJ SOAJ injection Inject 0.3 mg into the muscle as needed for anaphylaxis. 1 each 5   losartan (COZAAR) 100 MG tablet Take 1 tablet by mouth once daily 90 tablet 1   meclizine (ANTIVERT) 25 MG tablet Take 1 tablet (25 mg total) by mouth 3 (three) times daily as needed for dizziness.  30 tablet 2   omeprazole (PRILOSEC) 20 MG capsule Take 1 capsule by mouth once daily in the morning 90 capsule 0   tamsulosin (FLOMAX) 0.4 MG CAPS capsule Take 1 capsule (0.4 mg total) by mouth daily. 90 capsule 1   No current facility-administered medications on file prior to visit.    BP 136/84 (BP Location: Left Arm, Patient  Position: Sitting, Cuff Size: Large)   Pulse 73   Temp 98.4 F (36.9 C) (Oral)   Wt 257 lb (116.6 kg)   SpO2 96%   BMI 37.95 kg/m       Objective:   Physical Exam Vitals and nursing note reviewed.  Constitutional:      Appearance: Normal appearance.  HENT:     Right Ear: Tympanic membrane, ear canal and external ear normal. There is no impacted cerumen.     Nose: Nose normal.     Mouth/Throat:     Dentition: Abnormal dentition.     Comments: Grinding and popping sensation noted at right TMJ Neurological:     Mental Status: He is alert.       Assessment & Plan:  1. TMJ (temporomandibular joint disorder)  - methylPREDNISolone (MEDROL DOSEPAK) 4 MG TBPK tablet; Take as directed  Dispense: 21 tablet; Refill: 0 - cyclobenzaprine (FLEXERIL) 10 MG tablet; Take 1 tablet (10 mg total) by mouth 3 (three) times daily as needed for muscle spasms.  Dispense: 30 tablet; Refill: 0  [name], NP

## 2022-01-09 NOTE — Progress Notes (Signed)
   Subjective:    Patient ID: David Vincent, male    DOB: [birth year], 71 y.o.   MRN: [id number]  HPI    Review of Systems     Objective:   Physical Exam        Assessment & Plan:

## 2022-01-14 ENCOUNTER — Other Ambulatory Visit: Payer: Self-pay | Admitting: Adult Health

## 2022-01-16 DIAGNOSIS — M1712 Unilateral primary osteoarthritis, left knee: Secondary | ICD-10-CM | POA: Diagnosis not present

## 2022-01-18 DIAGNOSIS — M1712 Unilateral primary osteoarthritis, left knee: Secondary | ICD-10-CM | POA: Diagnosis not present

## 2022-01-22 DIAGNOSIS — M1712 Unilateral primary osteoarthritis, left knee: Secondary | ICD-10-CM | POA: Diagnosis not present

## 2022-01-24 DIAGNOSIS — M1712 Unilateral primary osteoarthritis, left knee: Secondary | ICD-10-CM | POA: Diagnosis not present

## 2022-01-26 ENCOUNTER — Ambulatory Visit (INDEPENDENT_AMBULATORY_CARE_PROVIDER_SITE_OTHER): Payer: PPO

## 2022-01-26 ENCOUNTER — Other Ambulatory Visit: Payer: Self-pay | Admitting: Adult Health

## 2022-01-26 VITALS — Ht 69.0 in | Wt 257.0 lb

## 2022-01-26 DIAGNOSIS — Z0000 Encounter for general adult medical examination without abnormal findings: Secondary | ICD-10-CM

## 2022-01-26 NOTE — Patient Instructions (Addendum)
[name] , Thank you for taking time to come for your Medicare Wellness Visit. I appreciate your ongoing commitment to your health goals. Please review the following plan we discussed and let me know if I can assist you in the future.   These are the goals we discussed:  Goals       Walk better (pt-stated)      Weight (lb) < 200 lb (90.7 kg) (pt-stated)        This is a list of the screening recommended for you and due dates:  Health Maintenance  Topic Date Due   COVID-19 Vaccine (3 - Pfizer series) 02/11/2022*   Zoster (Shingles) Vaccine (1 of 2) 04/28/2022*   Tetanus Vaccine  03/06/2025   Pneumonia Vaccine  Completed   Flu Shot  Completed   Hepatitis C Screening: USPSTF Recommendation to screen - Ages 18-79 yo.  Completed   HPV Vaccine  Aged Out   Colon Cancer Screening  Discontinued  *Topic was postponed. The date shown is not the original due date.    Advanced directives: Advance directive discussed with you today. Even though you declined this today, please call our office should you change your mind, and we can give you the proper paperwork for you to fill out.   Conditions/risks identified: None  Next appointment: Follow up in one year for your annual wellness visit.    Preventive Care 71 Years and Older, Male  Preventive care refers to lifestyle choices and visits with your health care provider that can promote health and wellness. What does preventive care include? A yearly physical exam. This is also called an annual well check. Dental exams once or twice a year. Routine eye exams. Ask your health care provider how often you should have your eyes checked. Personal lifestyle choices, including: Daily care of your teeth and gums. Regular physical activity. Eating a healthy diet. Avoiding tobacco and drug use. Limiting alcohol use. Practicing safe sex. Taking low doses of aspirin every day. Taking vitamin and mineral supplements as recommended by your health  care provider. What happens during an annual well check? The services and screenings done by your health care provider during your annual well check will depend on your age, overall health, lifestyle risk factors, and family history of disease. Counseling  Your health care provider may ask you questions about your: Alcohol use. Tobacco use. Drug use. Emotional well-being. Home and relationship well-being. Sexual activity. Eating habits. History of falls. Memory and ability to understand (cognition). Work and work Statistician. Screening  You may have the following tests or measurements: Height, weight, and BMI. Blood pressure. Lipid and cholesterol levels. These may be checked every 5 years, or more frequently if you are over 71 years old. Skin check. Lung cancer screening. You may have this screening every year starting at age 71 if you have a 30-pack-year history of smoking and currently smoke or have quit within the past 15 years. Fecal occult blood test (FOBT) of the stool. You may have this test every year starting at age 71. Flexible sigmoidoscopy or colonoscopy. You may have a sigmoidoscopy every 5 years or a colonoscopy every 10 years starting at age 71. Prostate cancer screening. Recommendations will vary depending on your family history and other risks. Hepatitis C blood test. Hepatitis B blood test. Sexually transmitted disease (STD) testing. Diabetes screening. This is done by checking your blood sugar (glucose) after you have not eaten for a while (fasting). You may have this done every  1-3 years. Abdominal aortic aneurysm (AAA) screening. You may need this if you are a current or former smoker. Osteoporosis. You may be screened starting at age 71 if you are at high risk. Talk with your health care provider about your test results, treatment options, and if necessary, the need for more tests. Vaccines  Your health care provider may recommend certain vaccines, such  as: Influenza vaccine. This is recommended every year. Tetanus, diphtheria, and acellular pertussis (Tdap, Td) vaccine. You may need a Td booster every 10 years. Zoster vaccine. You may need this after age 71. Pneumococcal 13-valent conjugate (PCV13) vaccine. One dose is recommended after age 71. Pneumococcal polysaccharide (PPSV23) vaccine. One dose is recommended after age 71. Talk to your health care provider about which screenings and vaccines you need and how often you need them. This information is not intended to replace advice given to you by your health care provider. Make sure you discuss any questions you have with your health care provider. Document Released: 04/22/2015 Document Revised: 12/14/2015 Document Reviewed: 01/25/2015 Elsevier Interactive Patient Education  2017 [place] Prevention in the Home Falls can cause injuries. They can happen to people of all ages. There are many things you can do to make your home safe and to help prevent falls. What can I do on the outside of my home? Regularly fix the edges of walkways and driveways and fix any cracks. Remove anything that might make you trip as you walk through a door, such as a raised step or threshold. Trim any bushes or trees on the path to your home. Use bright outdoor lighting. Clear any walking paths of anything that might make someone trip, such as rocks or tools. Regularly check to see if handrails are loose or broken. Make sure that both sides of any steps have handrails. Any raised decks and porches should have guardrails on the edges. Have any leaves, snow, or ice cleared regularly. Use sand or salt on walking paths during winter. Clean up any spills in your garage right away. This includes oil or grease spills. What can I do in the bathroom? Use night lights. Install grab bars by the toilet and in the tub and shower. Do not use towel bars as grab bars. Use non-skid mats or decals in the tub or  shower. If you need to sit down in the shower, use a plastic, non-slip stool. Keep the floor dry. Clean up any water that spills on the floor as soon as it happens. Remove soap buildup in the tub or shower regularly. Attach bath mats securely with double-sided non-slip rug tape. Do not have throw rugs and other things on the floor that can make you trip. What can I do in the bedroom? Use night lights. Make sure that you have a light by your bed that is easy to reach. Do not use any sheets or blankets that are too big for your bed. They should not hang down onto the floor. Have a firm chair that has side arms. You can use this for support while you get dressed. Do not have throw rugs and other things on the floor that can make you trip. What can I do in the kitchen? Clean up any spills right away. Avoid walking on wet floors. Keep items that you use a lot in easy-to-reach places. If you need to reach something above you, use a strong step stool that has a grab bar. Keep electrical cords out of the way.  Do not use floor polish or wax that makes floors slippery. If you must use wax, use non-skid floor wax. Do not have throw rugs and other things on the floor that can make you trip. What can I do with my stairs? Do not leave any items on the stairs. Make sure that there are handrails on both sides of the stairs and use them. Fix handrails that are broken or loose. Make sure that handrails are as long as the stairways. Check any carpeting to make sure that it is firmly attached to the stairs. Fix any carpet that is loose or worn. Avoid having throw rugs at the top or bottom of the stairs. If you do have throw rugs, attach them to the floor with carpet tape. Make sure that you have a light switch at the top of the stairs and the bottom of the stairs. If you do not have them, ask someone to add them for you. What else can I do to help prevent falls? Wear shoes that: Do not have high heels. Have  rubber bottoms. Are comfortable and fit you well. Are closed at the toe. Do not wear sandals. If you use a stepladder: Make sure that it is fully opened. Do not climb a closed stepladder. Make sure that both sides of the stepladder are locked into place. Ask someone to hold it for you, if possible. Clearly mark and make sure that you can see: Any grab bars or handrails. First and last steps. Where the edge of each step is. Use tools that help you move around (mobility aids) if they are needed. These include: Canes. Walkers. Scooters. Crutches. Turn on the lights when you go into a dark area. Replace any light bulbs as soon as they burn out. Set up your furniture so you have a clear path. Avoid moving your furniture around. If any of your floors are uneven, fix them. If there are any pets around you, be aware of where they are. Review your medicines with your doctor. Some medicines can make you feel dizzy. This can increase your chance of falling. Ask your doctor what other things that you can do to help prevent falls. This information is not intended to replace advice given to you by your health care provider. Make sure you discuss any questions you have with your health care provider. Document Released: 01/20/2009 Document Revised: 09/01/2015 Document Reviewed: 04/30/2014 Elsevier Interactive Patient Education  2017 [employer].

## 2022-01-26 NOTE — Progress Notes (Signed)
Subjective:   David Vincent is a 71 y.o. male who presents for Medicare Annual/Subsequent preventive examination.  Review of Systems    Virtual Visit via Telephone Note  I connected with  David Vincent on 01/26/22 at  3:45 PM EDT by telephone and verified that I am speaking with the correct person using two identifiers.  Location: Patient: Home Provider: Office Persons participating in the virtual visit: patient/Nurse Health Advisor   I discussed the limitations, risks, security and privacy concerns of performing an evaluation and management service by telephone and the availability of in person appointments. The patient expressed understanding and agreed to proceed.  Interactive audio and video telecommunications were attempted between this nurse and patient, however failed, due to patient having technical difficulties OR patient did not have access to video capability.  We continued and completed visit with audio only.  Some vital signs may be absent or patient reported.   Tillie Rung, LPN  Cardiac Risk Factors include: advanced age (>23men, >71 women);male gender;hypertension     Objective:    Today's Vitals   01/26/22 1529  Weight: 257 lb (116.6 kg)  Height: 5\' 9"  (1.753 m)   Body mass index is 37.95 kg/m.     01/26/2022    3:37 PM 01/18/2021    1:28 PM 01/13/2020    1:24 PM 08/16/2019    1:43 PM 02/09/2019   10:13 PM 11/17/2015   10:49 AM 09/08/2015    9:10 PM  Advanced Directives  Does Patient Have a Medical Advance Directive? No No No No No No No  Does patient want to make changes to medical advance directive?   No - Patient declined      Would patient like information on creating a medical advance directive? No - Patient declined No - Patient declined  No - Patient declined No - Patient declined No - patient declined information     Current Medications (verified) Outpatient Encounter Medications as of 01/26/2022  Medication Sig   amLODipine (NORVASC)  5 MG tablet Take 1 tablet (5 mg total) by mouth daily.   atorvastatin (LIPITOR) 20 MG tablet Take 1 tablet by mouth once daily   cyclobenzaprine (FLEXERIL) 10 MG tablet Take 1 tablet (10 mg total) by mouth at bedtime.   cyclobenzaprine (FLEXERIL) 10 MG tablet Take 1 tablet (10 mg total) by mouth 3 (three) times daily as needed for muscle spasms.   EPINEPHrine 0.3 mg/0.3 mL IJ SOAJ injection Inject 0.3 mg into the muscle as needed for anaphylaxis.   losartan (COZAAR) 100 MG tablet Take 1 tablet by mouth once daily   meclizine (ANTIVERT) 25 MG tablet Take 1 tablet (25 mg total) by mouth 3 (three) times daily as needed for dizziness.   methylPREDNISolone (MEDROL DOSEPAK) 4 MG TBPK tablet Take as directed   omeprazole (PRILOSEC) 20 MG capsule Take 1 capsule by mouth once daily in the morning   tamsulosin (FLOMAX) 0.4 MG CAPS capsule Take 1 capsule (0.4 mg total) by mouth daily.   [DISCONTINUED] omeprazole (PRILOSEC) 20 MG capsule Take 1 capsule by mouth once daily in the morning   No facility-administered encounter medications on file as of 01/26/2022.    Allergies (verified) Bee venom, Gabapentin, Codeine phosphate, and Penicillins   History: Past Medical History:  Diagnosis Date   BACK PAIN 03/10/2007   CAD (coronary artery disease)    A.  01/05/2002 Cath - nonobs dzs (LAD 20prox, RI 30-40 prox)   ED (erectile dysfunction)  GERD (gastroesophageal reflux disease)    History of mumps orchitis    Hyperlipidemia    HYPERTENSION 11/15/2006   INGUINAL HERNIA, RIGHT 12/05/2009   LEG CRAMPS 12/03/2008   SLEEP APNEA 11/16/2008   Sleep apnea    no cpap    Unstable angina (HCC)    VERTIGO, POSITIONAL 08/08/2009   Past Surgical History:  Procedure Laterality Date   CARDIAC CATHETERIZATION     COLONOSCOPY  2011   F/V-tics/normal- 10 yr recall   FOOT SURGERY Right 2019   KNEE ARTHROSCOPY Right    SHOULDER SURGERY Bilateral    Family History  Problem Relation Age of Onset   Pneumonia Mother         died 3688 - hip fx/pna   Heart attack Father        died 8990   Hypertension Sister        alive   Cancer Brother        died - agent orange exposure   Hypertension Brother        alive   Colon cancer Neg Hx    Colon polyps Neg Hx    Esophageal cancer Neg Hx    Rectal cancer Neg Hx    Stomach cancer Neg Hx    Social History   Socioeconomic History   Marital status: Married    Spouse name: Not on file   Number of children: Not on file   Years of education: Not on file   Highest education level: Not on file  Occupational History   Not on file  Tobacco Use   Smoking status: Never   Smokeless tobacco: Never  Vaping Use   Vaping Use: Never used  Substance and Sexual Activity   Alcohol use: No   Drug use: No   Sexual activity: Not on file  Other Topics Concern   Not on file  Social History Narrative   05/02/2011:  Currently unemployed.  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  Does not exercise or adhere to any specific diet.   Social Determinants of Health   Financial Resource Strain: Low Risk  (01/26/2022)   Overall Financial Resource Strain (CARDIA)    Difficulty of Paying Living Expenses: Not hard at all  Food Insecurity: No Food Insecurity (01/26/2022)   Hunger Vital Sign    Worried About Running Out of Food in the Last Year: Never true    Ran Out of Food in the Last Year: Never true  Transportation Needs: No Transportation Needs (01/26/2022)   PRAPARE - Administrator, Civil ServiceTransportation    Lack of Transportation (Medical): No    Lack of Transportation (Non-Medical): No  Physical Activity: Sufficiently Active (01/26/2022)   Exercise Vital Sign    Days of Exercise per Week: 5 days    Minutes of Exercise per Session: 30 min  Stress: No Stress Concern Present (01/26/2022)   [employer] - Occupational Stress Questionnaire    Feeling of Stress : Not at all  Social Connections: Socially Integrated (01/26/2022)   Social Connection and  Isolation Panel [NHANES]    Frequency of Communication with Friends and Family: More than three times a week    Frequency of Social Gatherings with Friends and Family: More than three times a week    Attends Religious Services: More than 4 times per year    Active Member of [employer] or Organizations: Yes    Attends BankerClub or Organization Meetings: More than 4 times  per year    Marital Status: Married    Tobacco Counseling Counseling given: Not Answered   Clinical Intake:  Pre-visit preparation completed: No  Pain : No/denies pain     BMI - recorded: 37.95 Nutritional Status: BMI > 30  Obese Nutritional Risks: None Diabetes: No  How often do you need to have someone help you when you read instructions, pamphlets, or other written materials from your doctor or pharmacy?: 1 - Never  Diabetic?  No  Interpreter Needed?: No  Information entered by :: Theresa Mulligan LPN   Activities of Daily Living    01/26/2022    3:35 PM  In your present state of health, do you have any difficulty performing the following activities:  Hearing? 0  Vision? 0  Difficulty concentrating or making decisions? 0  Walking or climbing stairs? 0  Dressing or bathing? 0  Doing errands, shopping? 0  Preparing Food and eating ? N  Using the Toilet? N  In the past six months, have you accidently leaked urine? N  Do you have problems with loss of bowel control? N  Managing your Medications? N  Managing your Finances? N  Housekeeping or managing your Housekeeping? N    Patient Care Team: [name], NP as PCP - General (Family Medicine)  Indicate any recent Medical Services you may have received from other than [facility] providers in the past year (date may be approximate).     Assessment:   This is a routine wellness examination for David Vincent.  Hearing/Vision screen Hearing Screening - Comments:: Denies hearing difficulties   Vision Screening - Comments:: Wears rx glasses - up to date with  routine eye exams with  [facility]  Dietary issues and exercise activities discussed: Current Exercise Habits: Home exercise routine, Type of exercise: walking, Time (Minutes): 30, Frequency (Times/Week): 5, Weekly Exercise (Minutes/Week): 150, Intensity: Moderate, Exercise limited by: None identified   Goals Addressed               This Visit's Progress     Walk better (pt-stated)         Depression Screen    01/26/2022    3:33 PM 01/09/2022    4:56 PM 12/20/2021    2:49 PM 01/18/2021    1:29 PM 01/18/2021    1:26 PM 02/24/2020    3:05 PM 01/13/2020    1:27 PM  PHQ 2/9 Scores  PHQ - 2 Score 0 0 0 0 0 0 0  PHQ- 9 Score 0 0 0    0    Fall Risk    01/26/2022    3:35 PM 01/09/2022    4:55 PM 12/20/2021    2:49 PM 01/18/2021    1:28 PM 02/24/2020    3:05 PM  Fall Risk   Falls in the past year? 1 1 1  0 0  Number falls in past yr: 0 0 0 0 0  Injury with Fall? 0 0 0 0 0  Comment No injury followed by medical attention.      Risk for fall due to : No Fall Risks Other (Comment) No Fall Risks    Follow up Falls prevention discussed Falls evaluation completed Falls evaluation completed Falls evaluation completed Falls evaluation completed    FALL RISK PREVENTION PERTAINING TO THE HOME:  Any stairs in or around the home? Yes  If so, are there any without handrails? No  Home free of loose throw rugs in walkways, pet beds, electrical cords, etc? Yes  Adequate lighting in your home to reduce risk of falls? Yes   ASSISTIVE DEVICES UTILIZED TO PREVENT FALLS:  Life alert? No  Use of a cane, walker or w/c? No  Grab bars in the bathroom? No  Shower chair or bench in shower? No  Elevated toilet seat or a handicapped toilet? No   TIMED UP AND GO:  Was the test performed? No . Audio Visit  Cognitive Function:        01/26/2022    3:37 PM  6CIT Screen  What Year? 0 points  What month? 0 points  What time? 0 points  Count back from 20 0 points  Months in reverse 0  points  Repeat phrase 0 points  Total Score 0 points    Immunizations Immunization History  Administered Date(s) Administered   Fluad Quad(high Dose 65+) 01/22/2019, 01/27/2020, 01/27/2021, 12/20/2021   Influenza Split 12/14/2011   Influenza,inj,Quad PF,6+ Mos 03/09/2014   PFIZER(Purple Top)SARS-COV-2 Vaccination 06/19/2019, 07/13/2019   Pneumococcal Conjugate-13 04/16/2016   Pneumococcal Polysaccharide-23 09/30/2017   Tdap 03/07/2015   Zoster, Live 10/25/2015    TDAP status: Up to date  Flu Vaccine status: Up to date  Pneumococcal vaccine status: Up to date  Covid-19 vaccine status: Completed vaccines  Qualifies for Shingles Vaccine? Yes   Zostavax completed No   Shingrix Completed?: No.    Education has been provided regarding the importance of this vaccine. Patient has been advised to call insurance company to determine out of pocket expense if they have not yet received this vaccine. Advised may also receive vaccine at local pharmacy or Health Dept. Verbalized acceptance and understanding.  Screening Tests Health Maintenance  Topic Date Due   COVID-19 Vaccine (3 - Pfizer series) 02/11/2022 (Originally 09/07/2019)   Zoster Vaccines- Shingrix (1 of 2) 04/28/2022 (Originally 12/10/2000)   TETANUS/TDAP  03/06/2025   Pneumonia Vaccine 27+ Years old  Completed   INFLUENZA VACCINE  Completed   Hepatitis C Screening  Completed   HPV VACCINES  Aged Out   COLONOSCOPY (Pts 45-25yrs Insurance coverage will need to be confirmed)  Discontinued    Health Maintenance  There are no preventive care reminders to display for this patient.     Lung Cancer Screening: (Low Dose CT Chest recommended if Age 58-80 years, 30 pack-year currently smoking OR have quit w/in 15years.) does not qualify.     Additional Screening:  Hepatitis C Screening: does qualify; Completed 09/30/17  Vision Screening: Recommended annual ophthalmology exams for early detection of glaucoma and other  disorders of the eye. Is the patient up to date with their annual eye exam?  Yes  Who is the provider or what is the name of the office in which the patient attends annual eye exams? [facility] If pt is not established with a provider, would they like to be referred to a provider to establish care? No .   Dental Screening: Recommended annual dental exams for proper oral hygiene  Community Resource Referral / Chronic Care Management:  CRR required this visit?  No   CCM required this visit?  No      Plan:     I have personally reviewed and noted the following in the patient's chart:   Medical and social history Use of alcohol, tobacco or illicit drugs  Current medications and supplements including opioid prescriptions. Patient is not currently taking opioid prescriptions. Functional ability and status Nutritional status Physical activity Advanced directives List of other physicians Hospitalizations, surgeries, and ER visits  in previous 12 months Vitals Screenings to include cognitive, depression, and falls Referrals and appointments  In addition, I have reviewed and discussed with patient certain preventive protocols, quality metrics, and best practice recommendations. A written personalized care plan for preventive services as well as general preventive health recommendations were provided to patient.     Criselda Peaches, LPN   [birth year]   Nurse Notes: None

## 2022-01-29 DIAGNOSIS — M1712 Unilateral primary osteoarthritis, left knee: Secondary | ICD-10-CM | POA: Diagnosis not present

## 2022-01-30 ENCOUNTER — Other Ambulatory Visit: Payer: Self-pay | Admitting: Adult Health

## 2022-01-30 DIAGNOSIS — I10 Essential (primary) hypertension: Secondary | ICD-10-CM

## 2022-01-31 DIAGNOSIS — M1712 Unilateral primary osteoarthritis, left knee: Secondary | ICD-10-CM | POA: Diagnosis not present

## 2022-02-01 ENCOUNTER — Ambulatory Visit (INDEPENDENT_AMBULATORY_CARE_PROVIDER_SITE_OTHER): Payer: PPO | Admitting: Adult Health

## 2022-02-01 ENCOUNTER — Encounter: Payer: Self-pay | Admitting: Adult Health

## 2022-02-01 VITALS — BP 128/80 | HR 64 | Temp 97.5°F | Ht 70.5 in | Wt 252.0 lb

## 2022-02-01 DIAGNOSIS — R252 Cramp and spasm: Secondary | ICD-10-CM

## 2022-02-01 DIAGNOSIS — Z0000 Encounter for general adult medical examination without abnormal findings: Secondary | ICD-10-CM

## 2022-02-01 DIAGNOSIS — N401 Enlarged prostate with lower urinary tract symptoms: Secondary | ICD-10-CM | POA: Diagnosis not present

## 2022-02-01 DIAGNOSIS — R351 Nocturia: Secondary | ICD-10-CM | POA: Diagnosis not present

## 2022-02-01 DIAGNOSIS — G4730 Sleep apnea, unspecified: Secondary | ICD-10-CM

## 2022-02-01 DIAGNOSIS — I10 Essential (primary) hypertension: Secondary | ICD-10-CM

## 2022-02-01 DIAGNOSIS — K219 Gastro-esophageal reflux disease without esophagitis: Secondary | ICD-10-CM

## 2022-02-01 LAB — CBC WITH DIFFERENTIAL/PLATELET
Basophils Absolute: 0.1 10*3/uL (ref 0.0–0.1)
Basophils Relative: 0.9 % (ref 0.0–3.0)
Eosinophils Absolute: 0.2 10*3/uL (ref 0.0–0.7)
Eosinophils Relative: 3.6 % (ref 0.0–5.0)
HCT: 42 % (ref 39.0–52.0)
Hemoglobin: 14.2 g/dL (ref 13.0–17.0)
Lymphocytes Relative: 37.1 % (ref 12.0–46.0)
Lymphs Abs: 2.4 10*3/uL (ref 0.7–4.0)
MCHC: 33.8 g/dL (ref 30.0–36.0)
MCV: 96.2 fl (ref 78.0–100.0)
Monocytes Absolute: 0.5 10*3/uL (ref 0.1–1.0)
Monocytes Relative: 8 % (ref 3.0–12.0)
Neutro Abs: 3.2 10*3/uL (ref 1.4–7.7)
Neutrophils Relative %: 50.4 % (ref 43.0–77.0)
Platelets: 225 10*3/uL (ref 150.0–400.0)
RBC: 4.37 Mil/uL (ref 4.22–5.81)
RDW: 13.6 % (ref 11.5–15.5)
WBC: 6.4 10*3/uL (ref 4.0–10.5)

## 2022-02-01 LAB — COMPREHENSIVE METABOLIC PANEL
ALT: 17 U/L (ref 0–53)
AST: 19 U/L (ref 0–37)
Albumin: 4.5 g/dL (ref 3.5–5.2)
Alkaline Phosphatase: 85 U/L (ref 39–117)
BUN: 22 mg/dL (ref 6–23)
CO2: 31 mEq/L (ref 19–32)
Calcium: 10 mg/dL (ref 8.4–10.5)
Chloride: 103 mEq/L (ref 96–112)
Creatinine, Ser: 1.01 mg/dL (ref 0.40–1.50)
GFR: 75.01 mL/min (ref >60.00)
Glucose, Bld: 87 mg/dL (ref 70–99)
Potassium: 4.5 mEq/L (ref 3.5–5.1)
Sodium: 141 mEq/L (ref 135–145)
Total Bilirubin: 0.9 mg/dL (ref 0.2–1.2)
Total Protein: 6.8 g/dL (ref 6.0–8.3)

## 2022-02-01 LAB — LIPID PANEL
Cholesterol: 124 mg/dL (ref 0–200)
HDL: 45.1 mg/dL (ref >39.00)
LDL Cholesterol: 64 mg/dL (ref 0–99)
NonHDL: 79.19
Total CHOL/HDL Ratio: 3
Triglycerides: 75 mg/dL (ref 0.0–149.0)
VLDL: 15 mg/dL (ref 0.0–40.0)

## 2022-02-01 LAB — HEMOGLOBIN A1C: Hgb A1c MFr Bld: 5.3 % (ref 4.6–6.5)

## 2022-02-01 LAB — TSH: TSH: 0.99 u[IU]/mL (ref 0.35–5.50)

## 2022-02-01 LAB — PSA: PSA: 0.37 ng/mL (ref 0.10–4.00)

## 2022-02-01 MED ORDER — TAMSULOSIN HCL 0.4 MG PO CAPS: 0.4000 mg | ORAL_CAPSULE | Freq: Every day | ORAL | 3 refills | Status: DC

## 2022-02-01 NOTE — Progress Notes (Signed)
Subjective:    Patient ID: David Vincent, male    DOB: 1951-03-23, 71 y.o.   MRN: [id number]  HPI Patient presents for yearly preventative medicine examination. He is a pleasant 71 year old male who  has a past medical history of BACK PAIN (03/10/2007), CAD (coronary artery disease), ED (erectile dysfunction), GERD (gastroesophageal reflux disease), History of mumps orchitis, Hyperlipidemia, HYPERTENSION (11/15/2006), INGUINAL HERNIA, RIGHT (12/05/2009), LEG CRAMPS (12/03/2008), SLEEP APNEA (11/16/2008), Sleep apnea, Unstable angina (HCC), and VERTIGO, POSITIONAL (08/08/2009).  Hypertension-managed with Norvasc 5 mg daily and Cozaar 100 mg daily.  He denies dizziness, lightheadedness, chest pain, or shortness of breath BP Readings from Last 3 Encounters:  02/01/22 128/80  01/09/22 136/84  12/20/21 130/80   GERD-managed with Prilosec 20 mg daily  Hyperlipidemia- managed with Lipitor 20 mg daily. He continues to have leg cramps mostly at night. He is going though PT.  Lab Results  Component Value Date   CHOL 107 01/27/2021   HDL 40.30 01/27/2021   LDLCALC 49 01/27/2021   TRIG 88.0 01/27/2021   CHOLHDL 3 01/27/2021   OSA-refuses to wear CPAP  BPH-managed with Flomax 0.4 mg daily. He has been out of the medication. Currently getting up 2-3 times a night  GERD-well controlled with Prilosec 20 mg daily  All immunizations and health maintenance protocols were reviewed with the patient and needed orders were placed.  Appropriate screening laboratory values were ordered for the patient including screening of hyperlipidemia, renal function and hepatic function.  Medication reconciliation,  past medical history, social history, problem list and allergies were reviewed in detail with the patient  Goals were established with regard to weight loss, exercise, and  diet in compliance with medications  Wt Readings from Last 10 Encounters:  02/01/22 252 lb (114.3 kg)  01/26/22 257 lb (116.6 kg)   01/09/22 257 lb (116.6 kg)  12/20/21 259 lb 9.6 oz (117.8 kg)  09/29/21 251 lb (113.9 kg)  08/18/21 253 lb (114.8 kg)  02/08/21 254 lb (115.2 kg)  01/31/21 254 lb (115.2 kg)  01/27/21 257 lb (116.6 kg)  01/18/21 262 lb (118.8 kg)     Review of Systems  Constitutional: Negative.   HENT: Negative.    Eyes: Negative.   Respiratory: Negative.    Cardiovascular: Negative.   Gastrointestinal: Negative.   Endocrine: Negative.   Genitourinary: Negative.   Musculoskeletal:  Positive for arthralgias, back pain and myalgias.  Skin: Negative.   Allergic/Immunologic: Negative.   Neurological: Negative.   Hematological: Negative.   Psychiatric/Behavioral: Negative.    All other systems reviewed and are negative.  Past Medical History:  Diagnosis Date   BACK PAIN 03/10/2007   CAD (coronary artery disease)    A.  01/05/2002 Cath - nonobs dzs (LAD 20prox, RI 30-40 prox)   ED (erectile dysfunction)    GERD (gastroesophageal reflux disease)    History of mumps orchitis    Hyperlipidemia    HYPERTENSION 11/15/2006   INGUINAL HERNIA, RIGHT 12/05/2009   LEG CRAMPS 12/03/2008   SLEEP APNEA 11/16/2008   Sleep apnea    no cpap    Unstable angina (HCC)    VERTIGO, POSITIONAL 08/08/2009    Social History   Socioeconomic History   Marital status: Married    Spouse name: Not on file   Number of children: Not on file   Years of education: Not on file   Highest education level: Not on file  Occupational History   Not on file  Tobacco Use  Smoking status: Never   Smokeless tobacco: Never  Vaping Use   Vaping Use: Never used  Substance and Sexual Activity   Alcohol use: No   Drug use: No   Sexual activity: Not on file  Other Topics Concern   Not on file  Social History Narrative   05/02/2011:  Currently unemployed.  Prev worked in a Hotel manager.  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  Does not exercise or adhere to any specific diet.   Social Determinants of Health   Financial Resource Strain:  Low Risk  (01/26/2022)   Overall Financial Resource Strain (CARDIA)    Difficulty of Paying Living Expenses: Not hard at all  Food Insecurity: No Food Insecurity (01/26/2022)   Hunger Vital Sign    Worried About Running Out of Food in the Last Year: Never true    Ran Out of Food in the Last Year: Never true  Transportation Needs: No Transportation Needs (01/26/2022)   PRAPARE - Hydrologist (Medical): No    Lack of Transportation (Non-Medical): No  Physical Activity: Sufficiently Active (01/26/2022)   Exercise Vital Sign    Days of Exercise per Week: 5 days    Minutes of Exercise per Session: 30 min  Stress: No Stress Concern Present (01/26/2022)   [place]    Feeling of Stress : Not at all  Social Connections: Socially Integrated (01/26/2022)   Social Connection and Isolation Panel [NHANES]    Frequency of Communication with Friends and Family: More than three times a week    Frequency of Social Gatherings with Friends and Family: More than three times a week    Attends Religious Services: More than 4 times per year    Active Member of [employer] or Organizations: Yes    Attends Music therapist: More than 4 times per year    Marital Status: Married  Human resources officer Violence: Not At Risk (01/26/2022)   Humiliation, Afraid, Rape, and Kick questionnaire    Fear of Current or Ex-Partner: No    Emotionally Abused: No    Physically Abused: No    Sexually Abused: No    Past Surgical History:  Procedure Laterality Date   CARDIAC CATHETERIZATION     COLONOSCOPY  2011   F/V-tics/normal- 10 yr recall   FOOT SURGERY Right 2019   KNEE ARTHROSCOPY Right    SHOULDER SURGERY Bilateral     Family History  Problem Relation Age of Onset   Pneumonia Mother        died 66 - hip fx/pna   Heart attack Father        died 62   Hypertension Sister        alive   Cancer Brother         died - agent orange exposure   Hypertension Brother        alive   Colon cancer Neg Hx    Colon polyps Neg Hx    Esophageal cancer Neg Hx    Rectal cancer Neg Hx    Stomach cancer Neg Hx     Allergies  Allergen Reactions   Bee Venom Anaphylaxis   Gabapentin Other (See Comments)    Hallucinations    Codeine Phosphate Itching   Penicillins Other (See Comments)    Has patient had a PCN reaction causing immediate rash, facial/tongue/throat swelling, SOB or lightheadedness with hypotension: Unknown, childhood reaction Has patient had a PCN reaction  causing severe rash involving mucus membranes or skin necrosis: No Has patient had a PCN reaction that required hospitalization No Has patient had a PCN reaction occurring within the last 10 years: No If all of the above answers are "NO", then may proceed with Cephalosporin use.     Current Outpatient Medications on File Prior to Visit  Medication Sig Dispense Refill   amLODipine (NORVASC) 5 MG tablet Take 1 tablet by mouth once daily 90 tablet 0   atorvastatin (LIPITOR) 20 MG tablet Take 1 tablet by mouth once daily 90 tablet 1   cyclobenzaprine (FLEXERIL) 10 MG tablet Take 1 tablet (10 mg total) by mouth at bedtime. 15 tablet 0   cyclobenzaprine (FLEXERIL) 10 MG tablet Take 1 tablet (10 mg total) by mouth 3 (three) times daily as needed for muscle spasms. 30 tablet 0   EPINEPHrine 0.3 mg/0.3 mL IJ SOAJ injection Inject 0.3 mg into the muscle as needed for anaphylaxis. 1 each 5   losartan (COZAAR) 100 MG tablet Take 1 tablet by mouth once daily 90 tablet 0   meclizine (ANTIVERT) 25 MG tablet Take 1 tablet (25 mg total) by mouth 3 (three) times daily as needed for dizziness. 30 tablet 2   Multiple Vitamin (MULTIVITAMIN) capsule Take 1 capsule by mouth daily.     omeprazole (PRILOSEC) 20 MG capsule Take 1 capsule by mouth once daily in the morning 90 capsule 0   tamsulosin (FLOMAX) 0.4 MG CAPS capsule Take 1 capsule (0.4 mg total) by mouth  daily. 90 capsule 1   No current facility-administered medications on file prior to visit.    BP 128/80   Pulse 64   Temp (!) 97.5 F (36.4 C) (Oral)   Ht 5' 10.5" (1.791 m)   Wt 252 lb (114.3 kg)   SpO2 98%   BMI 35.65 kg/m       Objective:   Physical Exam Vitals and nursing note reviewed.  Constitutional:      General: He is not in acute distress.    Appearance: Normal appearance. He is well-developed and normal weight.  HENT:     Head: Normocephalic and atraumatic.     Right Ear: Tympanic membrane, ear canal and external ear normal. There is no impacted cerumen.     Left Ear: Tympanic membrane, ear canal and external ear normal. There is no impacted cerumen.     Nose: Nose normal. No congestion or rhinorrhea.     Mouth/Throat:     Mouth: Mucous membranes are moist.     Pharynx: Oropharynx is clear. No oropharyngeal exudate or posterior oropharyngeal erythema.  Eyes:     General:        Right eye: No discharge.        Left eye: No discharge.     Extraocular Movements: Extraocular movements intact.     Conjunctiva/sclera: Conjunctivae normal.     Pupils: Pupils are equal, round, and reactive to light.  Neck:     Vascular: No carotid bruit.     Trachea: No tracheal deviation.  Cardiovascular:     Rate and Rhythm: Normal rate and regular rhythm.     Pulses: Normal pulses.     Heart sounds: Normal heart sounds. No murmur heard.    No friction rub. No gallop.  Pulmonary:     Effort: Pulmonary effort is normal. No respiratory distress.     Breath sounds: Normal breath sounds. No stridor. No wheezing, rhonchi or rales.  Chest:  Chest wall: No tenderness.  Abdominal:     General: Bowel sounds are normal. There is no distension.     Palpations: Abdomen is soft. There is no mass.     Tenderness: There is no abdominal tenderness. There is no right CVA tenderness, left CVA tenderness, guarding or rebound.     Hernia: No hernia is present.  Musculoskeletal:         General: No swelling, tenderness, deformity or signs of injury. Normal range of motion.     Right lower leg: No edema.     Left lower leg: No edema.  Lymphadenopathy:     Cervical: No cervical adenopathy.  Skin:    General: Skin is warm and dry.     Capillary Refill: Capillary refill takes less than 2 seconds.     Coloration: Skin is not jaundiced or pale.     Findings: No bruising, erythema, lesion or rash.  Neurological:     General: No focal deficit present.     Mental Status: He is alert and oriented to person, place, and time.     Cranial Nerves: No cranial nerve deficit.     Sensory: No sensory deficit.     Motor: No weakness.     Coordination: Coordination normal.     Gait: Gait normal.     Deep Tendon Reflexes: Reflexes normal.  Psychiatric:        Mood and Affect: Mood normal.        Behavior: Behavior normal.        Thought Content: Thought content normal.        Judgment: Judgment normal.       Assessment & Plan:  1. Routine general medical examination at a health care facility - Continue to stay active  - Follow up in one year or sooner if needed - CBC with Differential/Platelet; Future - Comprehensive metabolic panel; Future - Hemoglobin A1c; Future - Lipid panel; Future - TSH; Future  2. Essential hypertension - Well controlled.  - No change in medications  - CBC with Differential/Platelet; Future - Comprehensive metabolic panel; Future - Hemoglobin A1c; Future - Lipid panel; Future - TSH; Future  3. Benign prostatic hyperplasia with nocturia  - PSA; Future - tamsulosin (FLOMAX) 0.4 MG CAPS capsule; Take 1 capsule (0.4 mg total) by mouth daily.  Dispense: 90 capsule; Refill: 3  4. Sleep apnea, unspecified type - likely need another sleep study. He wants to hold off on that for the time being   5. Gastroesophageal reflux disease, unspecified whether esophagitis present - Continue PPI   6. Muscle cramping - Will have him stop his statin for two  weeks.  - If not improved need to look at OSA as a possible cause.   [name], NP

## 2022-02-01 NOTE — Patient Instructions (Signed)
It was great seeing you today   We will follow up with you regarding your lab work   Please let me know if you need anything   I am going to have you stop the lipitor for two weeks to see if this helps with the cramping

## 2022-02-05 ENCOUNTER — Telehealth: Payer: Self-pay

## 2022-02-05 DIAGNOSIS — M1712 Unilateral primary osteoarthritis, left knee: Secondary | ICD-10-CM | POA: Diagnosis not present

## 2022-02-05 NOTE — Telephone Encounter (Signed)
--  Caller state he had back pain that shot into his stomach last night and it is gone this morning now he is having diarrhea no blood no cp or shortness of breathno nause  02/05/2022 10:31:16 AM Hudson, RN, [facility] Advice Given Per Guideline HOME CARE: * You should be able to treat this at home. REASSURANCE AND EDUCATION - DIARRHEA: * Diarrhea may be caused by a virus ('stomach flu') or a bacteria. Diarrhea is one of the body's way of getting rid of germs. * Certain foods (e.g., dairy products, supplements like Ensure) can also trigger diarrhea. * In some people, the exact cause is never found. * Staying well-hydrated is the most important thing if you have diarrhea. From what you have told me, it sounds like you are not severely dehydrated at this point. * Here is some general care advice that should help. FLUID THERAPY DURING MILD TO MODERATE DIARRHEA: * Drink more fluids, at least 8 to 10 cups daily. One cup equals 8 oz (240 ml). * AVOID carbonated soft drinks (soda) as these can make your diarrhea worse. * AVOID alcohol beverages (e.g., beer, wine, hard liquor). * AVOID caffeinated beverages. Reason: Caffeine is mildly dehydrating. [place] YOUR HANDS: * Wash your hands after using the bathroom. * Wash your hands before fixing or eating food. * Wash soiled towels, sheets, or clothes separately. * If your work is cooking, Research scientist (medical), serving, or preparing food, then you should not work until the diarrhea has completely stopped. Check with your employer before going back to work. CALL BACK IF: * Signs of dehydration occur (e.g., no urine over 12 hours, very dry mouth, lightheaded, etc.) * Diarrhea lasts over 7 days * You become worse CARE ADVICE given per Diarrhea (Adult) guideline. * It is usually worse on days 1 and 2. * Viral diarrhea lasts 4 to 7 days. EXPECTED COURSE:

## 2022-02-07 DIAGNOSIS — M1712 Unilateral primary osteoarthritis, left knee: Secondary | ICD-10-CM | POA: Diagnosis not present

## 2022-02-12 DIAGNOSIS — M1712 Unilateral primary osteoarthritis, left knee: Secondary | ICD-10-CM | POA: Diagnosis not present

## 2022-02-14 DIAGNOSIS — M1712 Unilateral primary osteoarthritis, left knee: Secondary | ICD-10-CM | POA: Diagnosis not present

## 2022-02-15 ENCOUNTER — Ambulatory Visit (INDEPENDENT_AMBULATORY_CARE_PROVIDER_SITE_OTHER): Payer: PPO | Admitting: Adult Health

## 2022-02-15 ENCOUNTER — Encounter: Payer: Self-pay | Admitting: Adult Health

## 2022-02-15 VITALS — BP 140/80 | HR 63 | Temp 98.1°F | Ht 70.5 in | Wt 258.0 lb

## 2022-02-15 DIAGNOSIS — R051 Acute cough: Secondary | ICD-10-CM | POA: Diagnosis not present

## 2022-02-15 DIAGNOSIS — E782 Mixed hyperlipidemia: Secondary | ICD-10-CM

## 2022-02-15 MED ORDER — SIMVASTATIN 10 MG PO TABS: 10.0000 mg | ORAL_TABLET | Freq: Every day | ORAL | 0 refills | Status: DC

## 2022-02-15 MED ORDER — BENZONATATE 200 MG PO CAPS: 200.0000 mg | ORAL_CAPSULE | Freq: Two times a day (BID) | ORAL | 0 refills | Status: DC | PRN

## 2022-02-15 NOTE — Progress Notes (Signed)
Subjective:    Patient ID: David Vincent, male    DOB: November 04, 1950, 71 y.o.   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  When he was here 2 weeks ago he reported having pretty consistent muscle cramps in his lower extremities.  There was concern that this may be due to statin versus obstructive sleep apnea.  He was advised to stop the statin for 2 weeks and follow-up.  On follow-up he does report that he has noticed some improvement since stopping the statin.  He is agreeable to try a different statin to see if it causes the same symptoms.  He still does not want to go through with another sleep study as money is tight right now.  This generally, over the last week he has developed a nonproductive cough that he believes was passed on from his wife.  He denies any other symptoms such as fevers, chills, nasal congestion, shortness of breath, or wheezing   Review of Systems See HPI   Past Medical History:  Diagnosis Date   BACK PAIN 03/10/2007   CAD (coronary artery disease)    A.  01/05/2002 Cath - nonobs dzs (LAD 20prox, RI 30-40 prox)   ED (erectile dysfunction)    GERD (gastroesophageal reflux disease)    History of mumps orchitis    Hyperlipidemia    HYPERTENSION 11/15/2006   INGUINAL HERNIA, RIGHT 12/05/2009   LEG CRAMPS 12/03/2008   SLEEP APNEA 11/16/2008   Sleep apnea    no cpap    Unstable angina (HCC)    VERTIGO, POSITIONAL 08/08/2009    Social History   Socioeconomic History   Marital status: Married    Spouse name: Not on file   Number of children: Not on file   Years of education: Not on file   Highest education level: Not on file  Occupational History   Not on file  Tobacco Use   Smoking status: Never   Smokeless tobacco: Never  Vaping Use   Vaping Use: Never used  Substance and Sexual Activity   Alcohol use: No   Drug use: No   Sexual activity: Not on file  Other Topics  Concern   Not on file  Social History Narrative   05/02/2011:  Currently unemployed.  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  Does not exercise or adhere to any specific diet.   Social Determinants of Health   Financial Resource Strain: Low Risk  (01/26/2022)   Overall Financial Resource Strain (CARDIA)    Difficulty of Paying Living Expenses: Not hard at all  Food Insecurity: No Food Insecurity (01/26/2022)   Hunger Vital Sign    Worried About Running Out of Food in the Last Year: Never true    Ran Out of Food in the Last Year: Never true  Transportation Needs: No Transportation Needs (01/26/2022)   PRAPARE - Administrator, Civil Service (Medical): No    Lack of Transportation (Non-Medical): No  Physical Activity: Sufficiently Active (01/26/2022)   Exercise Vital Sign    Days of Exercise per Week: 5 days    Minutes of Exercise per Session: 30 min  Stress: No Stress Concern Present (01/26/2022)   [employer] of Occupational Health - Occupational Stress Questionnaire    Feeling of Stress : Not at all  Social Connections: Socially Integrated (01/26/2022)   Social Connection and Isolation Panel [NHANES]  Frequency of Communication with Friends and Family: More than three times a week    Frequency of Social Gatherings with Friends and Family: More than three times a week    Attends Religious Services: More than 4 times per year    Active Member of [employer] or Organizations: Yes    Attends Engineer, structural: More than 4 times per year    Marital Status: Married  Catering manager Violence: Not At Risk (01/26/2022)   Humiliation, Afraid, Rape, and Kick questionnaire    Fear of Current or Ex-Partner: No    Emotionally Abused: No    Physically Abused: No    Sexually Abused: No    Past Surgical History:  Procedure Laterality Date   CARDIAC CATHETERIZATION     COLONOSCOPY  2011   F/V-tics/normal- 10 yr recall   FOOT SURGERY Right 2019    KNEE ARTHROSCOPY Right    SHOULDER SURGERY Bilateral     Family History  Problem Relation Age of Onset   Pneumonia Mother        died 30 - hip fx/pna   Heart attack Father        died 56   Hypertension Sister        alive   Cancer Brother        died - agent orange exposure   Hypertension Brother        alive   Colon cancer Neg Hx    Colon polyps Neg Hx    Esophageal cancer Neg Hx    Rectal cancer Neg Hx    Stomach cancer Neg Hx     Allergies  Allergen Reactions   Bee Venom Anaphylaxis   Gabapentin Other (See Comments)    Hallucinations    Codeine Phosphate Itching   Lipitor [Atorvastatin] Other (See Comments)    Muscle cramps    Penicillins Other (See Comments)    Has patient had a PCN reaction causing immediate rash, facial/tongue/throat swelling, SOB or lightheadedness with hypotension: Unknown, childhood reaction Has patient had a PCN reaction causing severe rash involving mucus membranes or skin necrosis: No Has patient had a PCN reaction that required hospitalization No Has patient had a PCN reaction occurring within the last 10 years: No If all of the above answers are "NO", then may proceed with Cephalosporin use.     Current Outpatient Medications on File Prior to Visit  Medication Sig Dispense Refill   amLODipine (NORVASC) 5 MG tablet Take 1 tablet by mouth once daily 90 tablet 0   atorvastatin (LIPITOR) 20 MG tablet Take 1 tablet by mouth once daily 90 tablet 1   cyclobenzaprine (FLEXERIL) 10 MG tablet Take 1 tablet (10 mg total) by mouth 3 (three) times daily as needed for muscle spasms. 30 tablet 0   EPINEPHrine 0.3 mg/0.3 mL IJ SOAJ injection Inject 0.3 mg into the muscle as needed for anaphylaxis. 1 each 5   losartan (COZAAR) 100 MG tablet Take 1 tablet by mouth once daily 90 tablet 0   meclizine (ANTIVERT) 25 MG tablet Take 1 tablet (25 mg total) by mouth 3 (three) times daily as needed for dizziness. 30 tablet 2   Multiple Vitamin (MULTIVITAMIN)  capsule Take 1 capsule by mouth daily.     omeprazole (PRILOSEC) 20 MG capsule Take 1 capsule by mouth once daily in the morning 90 capsule 0   tamsulosin (FLOMAX) 0.4 MG CAPS capsule Take 1 capsule (0.4 mg total) by mouth daily. 90 capsule 3   No  current facility-administered medications on file prior to visit.    BP (!) 140/80   Pulse 63   Temp 98.1 F (36.7 C) (Oral)   Ht 5' 10.5" (1.791 m)   Wt 258 lb (117 kg)   SpO2 98%   BMI 36.50 kg/m       Objective:   Physical Exam Vitals and nursing note reviewed.  Constitutional:      Appearance: Normal appearance. He is obese.  Cardiovascular:     Rate and Rhythm: Normal rate and regular rhythm.     Pulses: Normal pulses.     Heart sounds: Normal heart sounds.  Pulmonary:     Effort: Pulmonary effort is normal.     Breath sounds: Normal breath sounds.  Musculoskeletal:        General: Normal range of motion.  Skin:    General: Skin is warm and dry.  Neurological:     General: No focal deficit present.     Mental Status: He is alert and oriented to person, place, and time.  Psychiatric:        Mood and Affect: Mood normal.        Behavior: Behavior normal.        Thought Content: Thought content normal.        Judgment: Judgment normal.        Assessment & Plan:  1. Mixed hyperlipidemia -We will trial him on simvastatin.  I did encourage sleep study to rule out sleep apnea but he again refused at this time. - simvastatin (ZOCOR) 10 MG tablet; Take 1 tablet (10 mg total) by mouth at bedtime.  Dispense: 90 tablet; Refill: 0  2. Acute cough  - benzonatate (TESSALON) 200 MG capsule; Take 1 capsule (200 mg total) by mouth 2 (two) times daily as needed for cough.  Dispense: 20 capsule; Refill: 0  [name], NP  Time spent with patient today was 30 minutes which consisted of chart review, discussing diagnosis, work up, treatment answering questions and documentation.

## 2022-04-05 ENCOUNTER — Encounter: Payer: Self-pay | Admitting: Internal Medicine

## 2022-04-05 ENCOUNTER — Ambulatory Visit (INDEPENDENT_AMBULATORY_CARE_PROVIDER_SITE_OTHER): Payer: PPO | Admitting: Internal Medicine

## 2022-04-05 VITALS — BP 129/79 | HR 74 | Temp 98.7°F | Ht 70.5 in | Wt 261.6 lb

## 2022-04-05 DIAGNOSIS — R0981 Nasal congestion: Secondary | ICD-10-CM

## 2022-04-05 DIAGNOSIS — J039 Acute tonsillitis, unspecified: Secondary | ICD-10-CM

## 2022-04-05 DIAGNOSIS — J029 Acute pharyngitis, unspecified: Secondary | ICD-10-CM | POA: Diagnosis not present

## 2022-04-05 DIAGNOSIS — J0300 Acute streptococcal tonsillitis, unspecified: Secondary | ICD-10-CM | POA: Diagnosis not present

## 2022-04-05 DIAGNOSIS — R6889 Other general symptoms and signs: Secondary | ICD-10-CM | POA: Diagnosis not present

## 2022-04-05 DIAGNOSIS — R051 Acute cough: Secondary | ICD-10-CM

## 2022-04-05 LAB — POCT RAPID STREP A (OFFICE): Rapid Strep A Screen: NEGATIVE

## 2022-04-05 LAB — POCT INFLUENZA A/B
Influenza A, POC: NEGATIVE
Influenza B, POC: NEGATIVE

## 2022-04-05 LAB — POC COVID19 BINAXNOW: SARS Coronavirus 2 Ag: NEGATIVE

## 2022-04-05 MED ORDER — DOXYCYCLINE HYCLATE 100 MG PO TABS: 100.0000 mg | ORAL_TABLET | Freq: Two times a day (BID) | ORAL | 0 refills | Status: DC

## 2022-04-05 MED ORDER — PREDNISONE 20 MG PO TABS: ORAL_TABLET | ORAL | 0 refills | Status: DC

## 2022-04-05 MED ORDER — BENZONATATE 200 MG PO CAPS: 200.0000 mg | ORAL_CAPSULE | Freq: Two times a day (BID) | ORAL | 0 refills | Status: DC | PRN

## 2022-04-05 MED ORDER — DEXTROMETHORPHAN-GUAIFENESIN 10-100 MG/5ML PO LIQD: 10.0000 mL | ORAL | 1 refills | Status: DC | PRN

## 2022-04-05 NOTE — Progress Notes (Signed)
[facility]: [id number]   Acute Care Medical Office Visit  Patient:  David Vincent      Age: 71 y.o.       Sex:  male  Date:   04/05/2022  PCP:    Dorothyann Peng, NP   [place] Provider: Loralee Pacas, MD  Assessment/Plan:     ICD-10-CM   1. Flu-like symptoms  R68.89 POCT Influenza A/B    2. Acute cough  R05.1 POC COVID-19    benzonatate (TESSALON) 200 MG capsule    3. Nasal congestion  R09.81 POC COVID-19    dextromethorphan-guaiFENesin (TUSSIN DM) 10-100 MG/5ML liquid    4. Sore throat  J02.9 POCT rapid strep A    doxycycline (VIBRA-TABS) 100 MG tablet    5. Strep tonsillitis  J03.00     6. Tonsillitis  J03.90 predniSONE (DELTASONE) 20 MG tablet     Strep tonsillitis is like  Seems like strep throat based on cervical adenopathy, very swollen tonsils, and cough seems to be a throat clearing cough from tonsil induced gag.  Gave steroid and cough medications, explained its probably virus, but gave doxycycline bu plan ic sore throat gets severe or gets more difficult to sleep or breathe (he reporting the coughing very difficult for his sleep and I think its due to tonsils touching when he lies back)  Advised on Infection Control: We discussed the importance of daily sinus rinsing, hand hygiene, cough hygiene, masks, and quarantining to mitigate the spread of illness.  Follow up as needed, based on symptoms and resolution, go to ER if any difficulty catching breath.     Subjective:   SHONTA MELI is a 71 y.o. male with past medical history significant for Patient Active Problem List   Diagnosis Date Noted   Low testosterone 08/24/2014   Erectile dysfunction due to diseases classified elsewhere 08/24/2014   Multiple fractures of ribs of left side 01/01/2012   GERD (gastroesophageal reflux disease) 05/03/2011   CAD (coronary artery disease)    Unstable angina ([place])    INGUINAL HERNIA, RIGHT 12/05/2009   VERTIGO, POSITIONAL  08/08/2009   Sleep apnea 11/16/2008   Essential hypertension 11/15/2006    Reviewed prior medications as listed: Outpatient Medications Prior to Visit  Medication Sig   amLODipine (NORVASC) 5 MG tablet Take 1 tablet by mouth once daily   atorvastatin (LIPITOR) 20 MG tablet Take 1 tablet by mouth once daily   cyclobenzaprine (FLEXERIL) 10 MG tablet Take 1 tablet (10 mg total) by mouth 3 (three) times daily as needed for muscle spasms.   EPINEPHrine 0.3 mg/0.3 mL IJ SOAJ injection Inject 0.3 mg into the muscle as needed for anaphylaxis.   losartan (COZAAR) 100 MG tablet Take 1 tablet by mouth once daily   meclizine (ANTIVERT) 25 MG tablet Take 1 tablet (25 mg total) by mouth 3 (three) times daily as needed for dizziness.   Multiple Vitamin (MULTIVITAMIN) capsule Take 1 capsule by mouth daily.   omeprazole (PRILOSEC) 20 MG capsule Take 1 capsule by mouth once daily in the morning   simvastatin (ZOCOR) 10 MG tablet Take 1 tablet (10 mg total) by mouth at bedtime.   tamsulosin (FLOMAX) 0.4 MG CAPS capsule Take 1 capsule (0.4 mg total) by mouth daily.   [DISCONTINUED] benzonatate (TESSALON) 200 MG capsule Take 1 capsule (200 mg total) by mouth 2 (two) times daily as needed for cough.   No facility-administered medications prior to visit.     He presented  today for evaluation of: Chief Complaint  Patient presents with   Nasal Congestion   Cough   Sore Throat    Symptoms for two days.   Generalized Body Aches     HPI   Patient is in today for 2 days nasal congestion cough, sore throat, myalgia(s).  He wanted to see a Dr. Before it gets serious. Tues at his house he fell into dirty water might have gotten sick from that. Denies sick exposure -other symptoms include: using bathroom often  -first symptoms of this illness appeared 2 days ago -Symptoms show no change -previous treatments: took tylenol only. -sick contacts/travel/risks: denies flu/COVID exposure - negative testing for it  here -denies Hx of: allergies, upper respiratory infections, immunosuppression - he did have history of brooken ribs in back 10 years ago that maybe flared. - there was a plastic tub that disintegrated when he fell into it- it was raining outside and water was leaking through the roof thing.   ROS - denies fevers, shortness of breath, nausea, vomiting, diarrhea, tooth pain, denies sinus pressure.  Has some sense of congestion only being in chest.    He does have muscle aches but they might be associated with his recent fall into the dirty water he has no fevers        Objective:  BP 129/79 (BP Location: Right Arm, Patient Position: Sitting)   Pulse 74   Temp 98.7 F (37.1 C) (Temporal)   Ht 5' 10.5" (1.791 m)   Wt 261 lb 9.6 oz (118.7 kg)   SpO2 97%   BMI 37.01 kg/m  Physical Exam was problem focused only:  Obese male in no acute distress no acute distress,  awake alert and oriented, uncomfortable but not toxic-appearing  normal work of breathing, lungs were clear to auscultation bilaterally Heart was Normal heart rate. Normal rhythm. No murmurs, rubs, or gallops.  Problem-specific findings:  stuffy sounding, wearing mask  Oral mucosa was moist without significant tonsillar swelling or exudate Turbinates erythematous with yellow nasal drainage, TM normal, pharynx mildly erythematous with very hypertrophied bilateral  tonsillar with serous nonexudative  surface, lungs clear. Left cervical tender shoddy adenopathy.   Results Reviewed: Results for orders placed or performed in visit on 04/05/22  POC COVID-19  Result Value Ref Range   SARS Coronavirus 2 Ag Negative Negative  POCT Influenza A/B  Result Value Ref Range   Influenza A, POC Negative Negative   Influenza B, POC Negative Negative  POCT rapid strep A  Result Value Ref Range   Rapid Strep A Screen Negative Negative      Lula Olszewski, MD

## 2022-04-12 ENCOUNTER — Ambulatory Visit (INDEPENDENT_AMBULATORY_CARE_PROVIDER_SITE_OTHER): Payer: PPO

## 2022-04-12 ENCOUNTER — Ambulatory Visit (INDEPENDENT_AMBULATORY_CARE_PROVIDER_SITE_OTHER): Payer: PPO | Admitting: Family Medicine

## 2022-04-12 VITALS — BP 120/64 | HR 97 | Temp 98.1°F | Wt 260.0 lb

## 2022-04-12 DIAGNOSIS — R051 Acute cough: Secondary | ICD-10-CM

## 2022-04-12 DIAGNOSIS — J40 Bronchitis, not specified as acute or chronic: Secondary | ICD-10-CM | POA: Diagnosis not present

## 2022-04-12 DIAGNOSIS — R059 Cough, unspecified: Secondary | ICD-10-CM | POA: Diagnosis not present

## 2022-04-12 MED ORDER — IPRATROPIUM-ALBUTEROL 0.5-2.5 (3) MG/3ML IN SOLN
3.0000 mL | Freq: Once | RESPIRATORY_TRACT | Status: AC
  Administered 2022-04-12: 3 mL via RESPIRATORY_TRACT

## 2022-04-12 MED ORDER — ALBUTEROL SULFATE HFA 108 (90 BASE) MCG/ACT IN AERS: 2.0000 | INHALATION_SPRAY | Freq: Four times a day (QID) | RESPIRATORY_TRACT | 0 refills | Status: DC | PRN

## 2022-04-12 MED ORDER — ALBUTEROL SULFATE (2.5 MG/3ML) 0.083% IN NEBU: 2.5000 mg | INHALATION_SOLUTION | Freq: Four times a day (QID) | RESPIRATORY_TRACT | 1 refills | Status: DC | PRN

## 2022-04-12 NOTE — Patient Instructions (Signed)
A prescription for an Albuterol inhaler was sent to your pharmacy.

## 2022-04-12 NOTE — Progress Notes (Signed)
Acute Office Visit  Subjective:     Patient ID: David Vincent, male    DOB: 11/30/1950, 72 y.o.   MRN: [id number]  Chief Complaint  Patient presents with   Follow-up    Pt reports he was seen w/ Dr. Randol Kern on 04/05/2022. Negative for covid, strept and flu. Sx of productive cough- yellow phlegm, bodyaches, fatigue. Headache, nasal congestion. Sx started on last Tuesday.  On Doxycycline, Benzonatate, cough syrup. Completed his prednisone. He c/o his chest hurts from the cough and shortness of breathe.     HPI Patient is in today for acute concern.  Patient endorses productive cough starting last Tuesday.  Also had bodyaches, headaches, nasal congestion.  Seen 04/05/22 by Dr. Randol Kern.  States COVID, flu, strep testing negative.  Patient started on doxycycline, given Tessalon and cough syrup to help with symptoms.  Denies fever, chills, nausea, vomiting.  Symptoms have continued.  Review of Systems  HENT:  Positive for congestion.   Respiratory:  Positive for cough.   Musculoskeletal:  Positive for myalgias.        Objective:    BP 120/64 (BP Location: Left Arm, Patient Position: Sitting, Cuff Size: Large)   Pulse 97   Temp 98.1 F (36.7 C) (Oral)   Wt 260 lb (117.9 kg)   SpO2 95%   BMI 36.78 kg/m    Physical Exam Constitutional:      Appearance: Normal appearance.  HENT:     Head: Atraumatic.     Right Ear: Tympanic membrane normal.     Left Ear: Tympanic membrane normal.     Nose: Nose normal. No rhinorrhea.     Mouth/Throat:     Mouth: Mucous membranes are moist.     Pharynx: No oropharyngeal exudate or posterior oropharyngeal erythema.  Eyes:     Extraocular Movements: Extraocular movements intact.     Pupils: Pupils are equal, round, and reactive to light.  Cardiovascular:     Rate and Rhythm: Normal rate and regular rhythm.     Heart sounds: Normal heart sounds.  Pulmonary:     Effort: Pulmonary effort is normal.     Breath sounds: Wheezing present. No  rhonchi or rales.  Skin:    General: Skin is warm and dry.  Neurological:     Mental Status: He is oriented to person, place, and time. Mental status is at baseline.     No results found for any visits on 04/12/22.      Assessment & Plan:   Problem List Items Addressed This Visit   None Visit Diagnoses     Bronchitis    -  Primary   Relevant Medications   albuterol (VENTOLIN HFA) 108 (90 Base) MCG/ACT inhaler   Other Relevant Orders   DG Chest 2 View   Acute cough       Relevant Orders   DG Chest 2 View       Meds ordered this encounter  Medications   albuterol (VENTOLIN HFA) 108 (90 Base) MCG/ACT inhaler    Sig: Inhale 2 puffs into the lungs every 6 (six) hours as needed for wheezing or shortness of breath.    Dispense:  8 g    Refill:  0   DISCONTD: albuterol (PROVENTIL) (2.5 MG/3ML) 0.083% nebulizer solution    Sig: Take 3 mLs (2.5 mg total) by nebulization every 6 (six) hours as needed for wheezing or shortness of breath.    Dispense:  150 mL    Refill:  1  Continued cough despite course of doxycycline.  Symptoms likely 2/2 bronchitis.  Obtain CXR to rule out infectious process.  DuoNeb given in clinic.  Improvement after treatment.  Albuterol inhaler as needed.  Given strict precautions.  Return if symptoms worsen or fail to improve.  Billie Ruddy, MD

## 2022-04-16 ENCOUNTER — Ambulatory Visit (INDEPENDENT_AMBULATORY_CARE_PROVIDER_SITE_OTHER): Payer: PPO | Admitting: Family Medicine

## 2022-04-16 ENCOUNTER — Encounter: Payer: Self-pay | Admitting: Family Medicine

## 2022-04-16 VITALS — BP 116/60 | HR 66 | Temp 97.9°F | Ht 70.5 in | Wt 260.4 lb

## 2022-04-16 DIAGNOSIS — S39012A Strain of muscle, fascia and tendon of lower back, initial encounter: Secondary | ICD-10-CM

## 2022-04-16 MED ORDER — CYCLOBENZAPRINE HCL 10 MG PO TABS: 10.0000 mg | ORAL_TABLET | Freq: Three times a day (TID) | ORAL | 0 refills | Status: DC | PRN

## 2022-04-16 MED ORDER — KETOROLAC TROMETHAMINE 60 MG/2ML IM SOLN
60.0000 mg | Freq: Once | INTRAMUSCULAR | Status: AC
  Administered 2022-04-16: 60 mg via INTRAMUSCULAR

## 2022-04-16 MED ORDER — KETOROLAC TROMETHAMINE 10 MG PO TABS: 10.0000 mg | ORAL_TABLET | Freq: Four times a day (QID) | ORAL | 0 refills | Status: AC | PRN

## 2022-04-16 NOTE — Progress Notes (Signed)
Established Patient Office Visit  Subjective   Patient ID: David Vincent, male    DOB: 1950/12/25  Age: 72 y.o. MRN: [id number]  Chief Complaint  Patient presents with   Back Pain    Patient complains of sudden onset of left-sided low back pain x1 day, difficulty walking, no known injury and patient questioned if this could be due to coughing    Pt reports that the coughing has improved but he woke up with severe pain in the left lower back, buttock area and also in the midline lumbar spine. States that the pain is going down his left leg as well. Is very stiff, having difficulty walking due to the pain. States that he recently got over bronchitis and was violently coughing during this time. He denies any fever or chills, no difficulty urinating, no constipation.    Current Outpatient Medications  Medication Instructions   albuterol (VENTOLIN HFA) 108 (90 Base) MCG/ACT inhaler 2 puffs, Inhalation, Every 6 hours PRN   amLODipine (NORVASC) 5 mg, Oral, Daily   atorvastatin (LIPITOR) 20 MG tablet Take 1 tablet by mouth once daily   benzonatate (TESSALON) 200 mg, Oral, 2 times daily PRN   cyclobenzaprine (FLEXERIL) 10 mg, Oral, 3 times daily PRN   dextromethorphan-guaiFENesin (TUSSIN DM) 10-100 MG/5ML liquid 10 mLs, Oral, Every 4 hours PRN   doxycycline (VIBRA-TABS) 100 mg, Oral, 2 times daily   EPINEPHrine (EPI-PEN) 0.3 mg, Intramuscular, As needed   ketorolac (TORADOL) 10 mg, Oral, Every 6 hours PRN   losartan (COZAAR) 100 MG tablet Take 1 tablet by mouth once daily   meclizine (ANTIVERT) 25 mg, Oral, 3 times daily PRN   Multiple Vitamin (MULTIVITAMIN) capsule 1 capsule, Oral, Daily   omeprazole (PRILOSEC) 20 MG capsule Take 1 capsule by mouth once daily in the morning   predniSONE (DELTASONE) 20 MG tablet Take 2 pills for 3 days, 1 pill for 4 days   simvastatin (ZOCOR) 10 mg, Oral, Daily at bedtime   tamsulosin (FLOMAX) 0.4 mg, Oral, Daily    Patient Active Problem List    Diagnosis Date Noted   Low testosterone 08/24/2014   Erectile dysfunction due to diseases classified elsewhere 08/24/2014   Multiple fractures of ribs of left side 01/01/2012   GERD (gastroesophageal reflux disease) 05/03/2011   CAD (coronary artery disease)    Unstable angina ([facility])    INGUINAL HERNIA, RIGHT 12/05/2009   VERTIGO, POSITIONAL 08/08/2009   Sleep apnea 11/16/2008   Essential hypertension 11/15/2006      ROS    Objective:     BP 116/60 (BP Location: Left Arm, Patient Position: Sitting, Cuff Size: Large)   Pulse 66   Temp 97.9 F (36.6 C) (Oral)   Ht 5' 10.5" (1.791 m)   Wt 260 lb 6.4 oz (118.1 kg)   SpO2 98%   BMI 36.84 kg/m    Physical Exam Constitutional:      Appearance: Normal appearance. He is obese.  Cardiovascular:     Rate and Rhythm: Normal rate and regular rhythm.     Pulses: Normal pulses.  Pulmonary:     Effort: Pulmonary effort is normal.     Breath sounds: Normal breath sounds.  Abdominal:     General: Bowel sounds are normal.  Musculoskeletal:     Lumbar back: Spasms and tenderness present. Decreased range of motion.  Neurological:     Mental Status: He is alert.      No results found for any visits on 04/16/22.  The ASCVD Risk score (Arnett DK, et al., 2019) failed to calculate for the following reasons:   The valid total cholesterol range is 130 to 320 mg/dL    Assessment & Plan:   Problem List Items Addressed This Visit   None Visit Diagnoses     Back strain, initial encounter    -  Primary   Relevant Medications   cyclobenzaprine (FLEXERIL) 10 MG tablet   ketorolac (TORADOL) injection 60 mg (Start on 04/16/2022  2:15 PM)   ketorolac (TORADOL) 10 MG tablet  Most likely an acute back strain due to the violent coughing from his acute bronchitis. I recommend giving a small course of flexeril 10 mg TID as needed for muscle cramps and I will administer injection of ketorolac 60 mg x 1 IM and give 3 days of ketorolac 10 mg  PO every 6 hours as needed to use at home. Encouraged the use of heating pads and muscle rubs OTC.     No follow-ups on file.    Farrel Conners, MD

## 2022-04-16 NOTE — Patient Instructions (Signed)
Icy hot or Biofreeze, aspercreme muscle rubs, put on back to help with the pain

## 2022-04-21 ENCOUNTER — Other Ambulatory Visit: Payer: Self-pay | Admitting: Adult Health

## 2022-04-26 ENCOUNTER — Encounter: Payer: Self-pay | Admitting: Family Medicine

## 2022-04-29 ENCOUNTER — Other Ambulatory Visit: Payer: Self-pay | Admitting: Adult Health

## 2022-04-29 DIAGNOSIS — I10 Essential (primary) hypertension: Secondary | ICD-10-CM

## 2022-05-02 ENCOUNTER — Telehealth: Payer: Self-pay

## 2022-05-02 NOTE — Telephone Encounter (Signed)
Patient Advocate Encounter  Prior Authorization for Albuterol Sulfate (2.5 MG/3ML)0.083% nebulizer solution has been approved.    PA# [id number] Effective dates: 05/02/22 through 04/09/23

## 2022-05-07 NOTE — Telephone Encounter (Signed)
Pt is aware.  

## 2022-05-11 ENCOUNTER — Other Ambulatory Visit: Payer: Self-pay | Admitting: Adult Health

## 2022-05-11 DIAGNOSIS — E782 Mixed hyperlipidemia: Secondary | ICD-10-CM

## 2022-06-18 ENCOUNTER — Encounter: Payer: Self-pay | Admitting: Family Medicine

## 2022-06-18 ENCOUNTER — Ambulatory Visit (INDEPENDENT_AMBULATORY_CARE_PROVIDER_SITE_OTHER): Payer: PPO | Admitting: Family Medicine

## 2022-06-18 VITALS — BP 120/62 | HR 62 | Temp 97.9°F | Resp 16 | Ht 70.5 in | Wt 259.1 lb

## 2022-06-18 DIAGNOSIS — R110 Nausea: Secondary | ICD-10-CM

## 2022-06-18 DIAGNOSIS — R197 Diarrhea, unspecified: Secondary | ICD-10-CM

## 2022-06-18 DIAGNOSIS — K529 Noninfective gastroenteritis and colitis, unspecified: Secondary | ICD-10-CM | POA: Diagnosis not present

## 2022-06-18 MED ORDER — ONDANSETRON 4 MG PO TBDP: 4.0000 mg | ORAL_TABLET | Freq: Three times a day (TID) | ORAL | 0 refills | Status: AC | PRN

## 2022-06-18 NOTE — Progress Notes (Signed)
ACUTE VISIT Chief Complaint  Patient presents with   Diarrhea    X 4-5 days, started last Wednesday. Has been drinking gatorade & powerade to stay hydrated. Having some nausea/feeling like he will vomit but hasn't.    HPI: [name] is a 72 y.o. male, who is here today complaining of diarrhea,watery stools, for the past four to five days.   He reports having 4-5 watery stools and none today so far. Nausea exacerbated by food intake. No changes in appetite.  Diarrhea  This is a new problem. The current episode started in the past 7 days. The problem occurs 5 to 10 times per day. The problem has been unchanged. The patient states that diarrhea awakens him from sleep. Associated symptoms include bloating and increased flatus. Pertinent negatives include no abdominal pain, arthralgias, chills, coughing, fever, headaches, myalgias, sweats, URI, vomiting or weight loss. There are no known risk factors. He has tried electrolyte solution and increased fluids for the symptoms. There is no history of inflammatory bowel disease or irritable bowel syndrome.  Negative for sick contacts or recent travel.   He has been consuming Gatorade and has taken Imodium without relief.  He denies any history of irritable bowel syndrome or recent changes in medications. No recent abx treatments.  He also reports experiencing fatigue and sleeping more than usual. He mentions that the diarrhea has been waking him up at night.   Lab Results  Component Value Date   CREATININE 1.01 02/01/2022   BUN 22 02/01/2022   NA 141 02/01/2022   K 4.5 02/01/2022   CL 103 02/01/2022   CO2 31 02/01/2022   Lab Results  Component Value Date   WBC 6.4 02/01/2022   HGB 14.2 02/01/2022   HCT 42.0 02/01/2022   MCV 96.2 02/01/2022   PLT 225.0 02/01/2022   Review of Systems  Constitutional:  Negative for chills, fever and weight loss.  HENT:  Negative for rhinorrhea and sore throat.   Respiratory:  Negative for  cough and shortness of breath.   Cardiovascular:  Negative for chest pain and palpitations.  Gastrointestinal:  Positive for bloating, diarrhea, flatus and nausea. Negative for abdominal pain, blood in stool and vomiting.  Endocrine: Negative for cold intolerance and heat intolerance.  Genitourinary:  Negative for decreased urine volume, dysuria and hematuria.  Musculoskeletal:  Negative for arthralgias and myalgias.  Neurological:  Negative for syncope, weakness and headaches.  See other pertinent positives and negatives in HPI.  Current Outpatient Medications on File Prior to Visit  Medication Sig Dispense Refill   amLODipine (NORVASC) 5 MG tablet Take 1 tablet by mouth once daily 90 tablet 0   EPINEPHrine 0.3 mg/0.3 mL IJ SOAJ injection Inject 0.3 mg into the muscle as needed for anaphylaxis. 1 each 5   losartan (COZAAR) 100 MG tablet Take 1 tablet by mouth once daily 90 tablet 0   meclizine (ANTIVERT) 25 MG tablet Take 1 tablet (25 mg total) by mouth 3 (three) times daily as needed for dizziness. 30 tablet 2   omeprazole (PRILOSEC) 20 MG capsule Take 1 capsule by mouth once daily in the morning 90 capsule 0   simvastatin (ZOCOR) 10 MG tablet TAKE 1 TABLET BY MOUTH AT BEDTIME 90 tablet 0   tamsulosin (FLOMAX) 0.4 MG CAPS capsule Take 1 capsule (0.4 mg total) by mouth daily. 90 capsule 3   No current facility-administered medications on file prior to visit.    Past Medical History:  Diagnosis Date  BACK PAIN 03/10/2007   CAD (coronary artery disease)    A.  01/05/2002 Cath - nonobs dzs (LAD 20prox, RI 30-40 prox)   ED (erectile dysfunction)    GERD (gastroesophageal reflux disease)    History of mumps orchitis    Hyperlipidemia    HYPERTENSION 11/15/2006   INGUINAL HERNIA, RIGHT 12/05/2009   LEG CRAMPS 12/03/2008   SLEEP APNEA 11/16/2008   Sleep apnea    no cpap    Unstable angina ([facility])    VERTIGO, POSITIONAL 08/08/2009   Allergies  Allergen Reactions   Bee Venom Anaphylaxis    Gabapentin Other (See Comments)    Hallucinations    Codeine Phosphate Itching   Lipitor [Atorvastatin] Other (See Comments)    Muscle cramps    Penicillins Other (See Comments)    Has patient had a PCN reaction causing immediate rash, facial/tongue/throat swelling, SOB or lightheadedness with hypotension: Unknown, childhood reaction Has patient had a PCN reaction causing severe rash involving mucus membranes or skin necrosis: No Has patient had a PCN reaction that required hospitalization No Has patient had a PCN reaction occurring within the last 10 years: No If all of the above answers are "NO", then may proceed with Cephalosporin use.     Social History   Socioeconomic History   Marital status: Married    Spouse name: Not on file   Number of children: Not on file   Years of education: Not on file   Highest education level: Not on file  Occupational History   Not on file  Tobacco Use   Smoking status: Never   Smokeless tobacco: Never  Vaping Use   Vaping Use: Never used  Substance and Sexual Activity   Alcohol use: No   Drug use: No   Sexual activity: Not on file  Other Topics Concern   Not on file  Social History Narrative   05/02/2011:  Currently unemployed.  Prev worked in a Hotel manager.  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  Does not exercise or adhere to any specific diet.   Social Determinants of Health   Financial Resource Strain: Low Risk  (01/26/2022)   Overall Financial Resource Strain (CARDIA)    Difficulty of Paying Living Expenses: Not hard at all  Food Insecurity: No Food Insecurity (01/26/2022)   Hunger Vital Sign    Worried About Running Out of Food in the Last Year: Never true    Ran Out of Food in the Last Year: Never true  Transportation Needs: No Transportation Needs (01/26/2022)   PRAPARE - Hydrologist (Medical): No    Lack of Transportation (Non-Medical): No  Physical Activity: Sufficiently Active (01/26/2022)   Exercise  Vital Sign    Days of Exercise per Week: 5 days    Minutes of Exercise per Session: 30 min  Stress: No Stress Concern Present (01/26/2022)   [place]    Feeling of Stress : Not at all  Social Connections: [place] (01/26/2022)   Social Connection and Isolation Panel [NHANES]    Frequency of Communication with Friends and Family: More than three times a week    Frequency of Social Gatherings with Friends and Family: More than three times a week    Attends Religious Services: More than 4 times per year    Active Member of [employer] or Organizations: Yes    Attends Archivist Meetings: More than 4 times per year  Marital Status: Married   Vitals:   06/18/22 1518  BP: 120/62  Pulse: 62  Resp: 16  Temp: 97.9 F (36.6 C)  SpO2: 97%   Body mass index is 36.66 kg/m.  Physical Exam Nursing note reviewed.  Constitutional:      General: He is not in acute distress.    Appearance: He is well-developed.  HENT:     Head: Normocephalic and atraumatic.  Eyes:     Conjunctiva/sclera: Conjunctivae normal.  Cardiovascular:     Rate and Rhythm: Normal rate and regular rhythm. Extrasystoles (x 2 in a minute.) are present.    Heart sounds: No murmur heard.    Comments: Trace pitting LE edema, bilateral. Pulmonary:     Effort: Pulmonary effort is normal. No respiratory distress.     Breath sounds: Normal breath sounds.  Abdominal:     Palpations: Abdomen is soft. There is no hepatomegaly or mass.     Tenderness: There is no abdominal tenderness.  Lymphadenopathy:     Cervical: No cervical adenopathy.  Skin:    General: Skin is warm.     Findings: No erythema or rash.  Neurological:     Mental Status: He is alert and oriented to person, place, and time.     Cranial Nerves: No cranial nerve deficit.     Gait: Gait normal.  Psychiatric:     Comments: Well groomed, good eye contact.   ASSESSMENT AND  PLAN:  Mr. Beichler was seen today for diarrhea and vomiting.  Gastroenteritis, acute We discussed possible etiologies. Viral is the most probable cause. Continue adequate hydration, if drinking Gatorade, dilute with water 1:1 ratio. Instructed about warning signs.  Nausea without vomiting Small sips of clear fluids and plain/bland diet, small portions at the time. Zofran recommended before meals tid prn.  -     Basic metabolic panel; Future -     CBC; Future -     Ondansetron; Take 1 tablet (4 mg total) by mouth every 8 (eight) hours as needed for up to 4 days for nausea or vomiting.  Dispense: 9 tablet; Refill: 0  Diarrhea, unspecified type To evaluate for electrolyte imbalance BMP ordered today. For now we can hold on stool Cx/parasite-ova/c. Diff but need to be consider of problem is persistent.  -     Basic metabolic panel; Future -     CBC; Future -     TSH; Future  Today noted 2 extra beats during heart auscultation. He denies any CP,palpitation,CP,or SOB. Instructed about warning signs.  Return in about 2 weeks (around 07/02/2022), or if symptoms worsen or fail to improve.  Lise Pincus G. [place], MD  [facility]. [place] office.

## 2022-06-18 NOTE — Patient Instructions (Addendum)
A few things to remember from today's visit:  Gastroenteritis, acute  Nausea without vomiting - Plan: Basic metabolic panel, CBC, ondansetron (ZOFRAN-ODT) 4 MG disintegrating tablet  Diarrhea, unspecified type - Plan: Basic metabolic panel, CBC, TSH  Continue adequate hydration, soaps are better, small amount at the time. Monitor for new symptoms.

## 2022-06-19 LAB — CBC
HCT: 40.6 % (ref 39.0–52.0)
Hemoglobin: 14 g/dL (ref 13.0–17.0)
MCHC: 34.5 g/dL (ref 30.0–36.0)
MCV: 95.7 fl (ref 78.0–100.0)
Platelets: 246 10*3/uL (ref 150.0–400.0)
RBC: 4.25 Mil/uL (ref 4.22–5.81)
RDW: 13.5 % (ref 11.5–15.5)
WBC: 8.1 10*3/uL (ref 4.0–10.5)

## 2022-06-19 LAB — BASIC METABOLIC PANEL
BUN: 10 mg/dL (ref 6–23)
CO2: 24 mEq/L (ref 19–32)
Calcium: 9.1 mg/dL (ref 8.4–10.5)
Chloride: 107 mEq/L (ref 96–112)
Creatinine, Ser: 0.83 mg/dL (ref 0.40–1.50)
GFR: 88.05 mL/min (ref >60.00)
Glucose, Bld: 85 mg/dL (ref 70–99)
Potassium: 4 mEq/L (ref 3.5–5.1)
Sodium: 139 mEq/L (ref 135–145)

## 2022-06-19 LAB — TSH: TSH: 0.68 u[IU]/mL (ref 0.35–5.50)

## 2022-06-20 ENCOUNTER — Telehealth: Payer: Self-pay | Admitting: Adult Health

## 2022-06-20 NOTE — Telephone Encounter (Signed)
Pt seen dr Martinique and is having trouble getting on his mychart and would like blood work results

## 2022-06-22 ENCOUNTER — Encounter: Payer: Self-pay | Admitting: Adult Health

## 2022-06-22 ENCOUNTER — Ambulatory Visit (INDEPENDENT_AMBULATORY_CARE_PROVIDER_SITE_OTHER): Payer: PPO | Admitting: Adult Health

## 2022-06-22 VITALS — BP 120/70 | HR 65 | Temp 97.7°F | Ht 70.5 in | Wt 260.0 lb

## 2022-06-22 DIAGNOSIS — R197 Diarrhea, unspecified: Secondary | ICD-10-CM

## 2022-06-22 MED ORDER — DICYCLOMINE HCL 10 MG PO CAPS: 10.0000 mg | ORAL_CAPSULE | Freq: Three times a day (TID) | ORAL | 0 refills | Status: DC

## 2022-06-22 NOTE — Addendum Note (Signed)
Addended by: Apolinar Junes on: 06/22/2022 01:40 PM   Modules accepted: Orders

## 2022-06-22 NOTE — Progress Notes (Addendum)
Subjective:    Patient ID: David Vincent, male    DOB: [birth year], 72 y.o.   MRN: [id number]  HPI 72 year old male who  has a past medical history of BACK PAIN (03/10/2007), CAD (coronary artery disease), ED (erectile dysfunction), GERD (gastroesophageal reflux disease), History of mumps orchitis, Hyperlipidemia, HYPERTENSION (11/15/2006), INGUINAL HERNIA, RIGHT (12/05/2009), LEG CRAMPS (12/03/2008), SLEEP APNEA (11/16/2008), Sleep apnea, Unstable angina ([place]), and VERTIGO, POSITIONAL (08/08/2009).  Presents to the office today for follow-up regarding gastroenteritis.  He was seen by another provider 4 days ago with a complaint of diarrhea watery stools x 4 to 5 days prior.  He did have some nausea that was exacerbated by food intake but no vomiting.  He had a CBC and BMP done which were normal.  Was given a prescription for Zofran to take as needed  Today he reports that he continues to have diarrhea. He did have a normal bowel movement yesterday but after that he started having diarrhea again. Yesterday he had 4 episodes of diarrhea and so far today he has had three episodes.   He has been taking imodium and pepto without improvement.    Wt Readings from Last 3 Encounters:  06/22/22 260 lb (117.9 kg)  06/18/22 259 lb 2 oz (117.5 kg)  04/16/22 260 lb 6.4 oz (118.1 kg)     Review of Systems See HPI   Past Medical History:  Diagnosis Date   BACK PAIN 03/10/2007   CAD (coronary artery disease)    A.  01/05/2002 Cath - nonobs dzs (LAD 20prox, RI 30-40 prox)   ED (erectile dysfunction)    GERD (gastroesophageal reflux disease)    History of mumps orchitis    Hyperlipidemia    HYPERTENSION 11/15/2006   INGUINAL HERNIA, RIGHT 12/05/2009   LEG CRAMPS 12/03/2008   SLEEP APNEA 11/16/2008   Sleep apnea    no cpap    Unstable angina ([place])    VERTIGO, POSITIONAL 08/08/2009    Social History   Socioeconomic History   Marital status: Married    Spouse name: Not on file   Number of children:  Not on file   Years of education: Not on file   Highest education level: Not on file  Occupational History   Not on file  Tobacco Use   Smoking status: Never   Smokeless tobacco: Never  Vaping Use   Vaping Use: Never used  Substance and Sexual Activity   Alcohol use: No   Drug use: No   Sexual activity: Not on file  Other Topics Concern   Not on file  Social History Narrative   05/02/2011:  Currently unemployed.  Prev worked in a Hotel manager.  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  Does not exercise or adhere to any specific diet.   Social Determinants of Health   Financial Resource Strain: Low Risk  (01/26/2022)   Overall Financial Resource Strain (CARDIA)    Difficulty of Paying Living Expenses: Not hard at all  Food Insecurity: No Food Insecurity (01/26/2022)   Hunger Vital Sign    Worried About Running Out of Food in the Last Year: Never true    Ran Out of Food in the Last Year: Never true  Transportation Needs: No Transportation Needs (01/26/2022)   PRAPARE - Hydrologist (Medical): No    Lack of Transportation (Non-Medical): No  Physical Activity: Sufficiently Active (01/26/2022)   Exercise Vital Sign    Days of Exercise per  Week: 5 days    Minutes of Exercise per Session: 30 min  Stress: No Stress Concern Present (01/26/2022)   [place]    Feeling of Stress : Not at all  Social Connections: Socially Integrated (01/26/2022)   Social Connection and Isolation Panel [NHANES]    Frequency of Communication with Friends and Family: More than three times a week    Frequency of Social Gatherings with Friends and Family: More than three times a week    Attends Religious Services: More than 4 times per year    Active Member of [employer] or Organizations: Yes    Attends Music therapist: More than 4 times per year    Marital Status: Married  Human resources officer Violence: Not At Risk  (01/26/2022)   Humiliation, Afraid, Rape, and Kick questionnaire    Fear of Current or Ex-Partner: No    Emotionally Abused: No    Physically Abused: No    Sexually Abused: No    Past Surgical History:  Procedure Laterality Date   CARDIAC CATHETERIZATION     COLONOSCOPY  2011   F/V-tics/normal- 10 yr recall   FOOT SURGERY Right 2019   KNEE ARTHROSCOPY Right    SHOULDER SURGERY Bilateral     Family History  Problem Relation Age of Onset   Pneumonia Mother        died 69 - hip fx/pna   Heart attack Father        died 50   Hypertension Sister        alive   Cancer Brother        died - agent orange exposure   Hypertension Brother        alive   Colon cancer Neg Hx    Colon polyps Neg Hx    Esophageal cancer Neg Hx    Rectal cancer Neg Hx    Stomach cancer Neg Hx     Allergies  Allergen Reactions   Bee Venom Anaphylaxis   Gabapentin Other (See Comments)    Hallucinations    Codeine Phosphate Itching   Lipitor [Atorvastatin] Other (See Comments)    Muscle cramps    Penicillins Other (See Comments)    Has patient had a PCN reaction causing immediate rash, facial/tongue/throat swelling, SOB or lightheadedness with hypotension: Unknown, childhood reaction Has patient had a PCN reaction causing severe rash involving mucus membranes or skin necrosis: No Has patient had a PCN reaction that required hospitalization No Has patient had a PCN reaction occurring within the last 10 years: No If all of the above answers are "NO", then may proceed with Cephalosporin use.     Current Outpatient Medications on File Prior to Visit  Medication Sig Dispense Refill   amLODipine (NORVASC) 5 MG tablet Take 1 tablet by mouth once daily 90 tablet 0   EPINEPHrine 0.3 mg/0.3 mL IJ SOAJ injection Inject 0.3 mg into the muscle as needed for anaphylaxis. 1 each 5   losartan (COZAAR) 100 MG tablet Take 1 tablet by mouth once daily 90 tablet 0   meclizine (ANTIVERT) 25 MG tablet Take 1 tablet  (25 mg total) by mouth 3 (three) times daily as needed for dizziness. 30 tablet 2   omeprazole (PRILOSEC) 20 MG capsule Take 1 capsule by mouth once daily in the morning 90 capsule 0   ondansetron (ZOFRAN-ODT) 4 MG disintegrating tablet Take 1 tablet (4 mg total) by mouth every 8 (eight) hours as needed for  up to 4 days for nausea or vomiting. 9 tablet 0   simvastatin (ZOCOR) 10 MG tablet TAKE 1 TABLET BY MOUTH AT BEDTIME 90 tablet 0   tamsulosin (FLOMAX) 0.4 MG CAPS capsule Take 1 capsule (0.4 mg total) by mouth daily. 90 capsule 3   No current facility-administered medications on file prior to visit.    BP 120/70   Pulse 65   Temp 97.7 F (36.5 C) (Oral)   Ht 5' 10.5" (1.791 m)   Wt 260 lb (117.9 kg)   SpO2 97%   BMI 36.78 kg/m       Objective:   Physical Exam Vitals and nursing note reviewed.  Constitutional:      Appearance: Normal appearance.  Cardiovascular:     Pulses: Normal pulses.     Heart sounds: Normal heart sounds.  Pulmonary:     Effort: Pulmonary effort is normal.     Breath sounds: Normal breath sounds.  Abdominal:     General: Abdomen is flat. Bowel sounds are normal.     Palpations: Abdomen is soft. There is no mass.     Tenderness: There is generalized abdominal tenderness.     Hernia: No hernia is present.  Neurological:     Mental Status: He is alert.       Assessment & Plan:  1. Diarrhea, unspecified type  - dicyclomine (BENTYL) 10 MG capsule; Take 1 capsule (10 mg total) by mouth 4 (four) times daily -  before meals and at bedtime for 10 days.  Dispense: 40 capsule; Refill: 0 - Stool culture; Future   Dorothyann Peng, NP

## 2022-06-25 ENCOUNTER — Other Ambulatory Visit: Payer: PPO

## 2022-06-25 DIAGNOSIS — R197 Diarrhea, unspecified: Secondary | ICD-10-CM | POA: Diagnosis not present

## 2022-06-29 LAB — STOOL CULTURE: E coli, Shiga toxin Assay: NEGATIVE

## 2022-07-11 ENCOUNTER — Encounter: Payer: Self-pay | Admitting: Adult Health

## 2022-07-11 ENCOUNTER — Ambulatory Visit (INDEPENDENT_AMBULATORY_CARE_PROVIDER_SITE_OTHER): Payer: PPO | Admitting: Adult Health

## 2022-07-11 VITALS — BP 120/82 | HR 53 | Temp 98.2°F | Ht 70.5 in | Wt 254.0 lb

## 2022-07-11 DIAGNOSIS — J0141 Acute recurrent pansinusitis: Secondary | ICD-10-CM

## 2022-07-11 DIAGNOSIS — R051 Acute cough: Secondary | ICD-10-CM

## 2022-07-11 MED ORDER — DOXYCYCLINE HYCLATE 100 MG PO CAPS: 100.0000 mg | ORAL_CAPSULE | Freq: Two times a day (BID) | ORAL | 0 refills | Status: DC

## 2022-07-11 MED ORDER — EPINEPHRINE 0.3 MG/0.3ML IJ SOAJ: 0.3000 mg | INTRAMUSCULAR | 5 refills | Status: AC | PRN

## 2022-07-11 MED ORDER — PREDNISONE 10 MG PO TABS: 10.0000 mg | ORAL_TABLET | Freq: Every day | ORAL | 0 refills | Status: DC

## 2022-07-11 MED ORDER — BENZONATATE 200 MG PO CAPS: 200.0000 mg | ORAL_CAPSULE | Freq: Two times a day (BID) | ORAL | 0 refills | Status: DC | PRN

## 2022-07-11 NOTE — Progress Notes (Signed)
Subjective:    Patient ID: David Vincent, male    DOB: 01-16-1951, 72 y.o.   MRN: [id number]  HPI 72 year old male who  has a past medical history of BACK PAIN (03/10/2007), CAD (coronary artery disease), ED (erectile dysfunction), GERD (gastroesophageal reflux disease), History of mumps orchitis, Hyperlipidemia, HYPERTENSION (11/15/2006), INGUINAL HERNIA, RIGHT (12/05/2009), LEG CRAMPS (12/03/2008), SLEEP APNEA (11/16/2008), Sleep apnea, Unstable angina, and VERTIGO, POSITIONAL (08/08/2009).  He presents to the office today for an acute issue. His symptoms started about a week ago. Symptoms include that of sinus pain and pressure, productive cough, headache, sneezing, itchy watery eyes., shortness of breath and ? Wheezing.   He denies fevers or chills.   He has been using claritin and mucinex for the last few days but has not noticed any improvement    Review of Systems See HPI   Past Medical History:  Diagnosis Date   BACK PAIN 03/10/2007   CAD (coronary artery disease)    A.  01/05/2002 Cath - nonobs dzs (LAD 20prox, RI 30-40 prox)   ED (erectile dysfunction)    GERD (gastroesophageal reflux disease)    History of mumps orchitis    Hyperlipidemia    HYPERTENSION 11/15/2006   INGUINAL HERNIA, RIGHT 12/05/2009   LEG CRAMPS 12/03/2008   SLEEP APNEA 11/16/2008   Sleep apnea    no cpap    Unstable angina    VERTIGO, POSITIONAL 08/08/2009    Social History   Socioeconomic History   Marital status: Married    Spouse name: Not on file   Number of children: Not on file   Years of education: Not on file   Highest education level: Not on file  Occupational History   Not on file  Tobacco Use   Smoking status: Never   Smokeless tobacco: Never  Vaping Use   Vaping Use: Never used  Substance and Sexual Activity   Alcohol use: No   Drug use: No   Sexual activity: Not on file  Other Topics Concern   Not on file  Social History Narrative   05/02/2011:  Currently unemployed.  Prev worked  in a Hotel manager.  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  Does not exercise or adhere to any specific diet.   Social Determinants of Health   Financial Resource Strain: Low Risk  (01/26/2022)   Overall Financial Resource Strain (CARDIA)    Difficulty of Paying Living Expenses: Not hard at all  Food Insecurity: No Food Insecurity (01/26/2022)   Hunger Vital Sign    Worried About Running Out of Food in the Last Year: Never true    Ran Out of Food in the Last Year: Never true  Transportation Needs: No Transportation Needs (01/26/2022)   PRAPARE - Hydrologist (Medical): No    Lack of Transportation (Non-Medical): No  Physical Activity: Sufficiently Active (01/26/2022)   Exercise Vital Sign    Days of Exercise per Week: 5 days    Minutes of Exercise per Session: 30 min  Stress: No Stress Concern Present (01/26/2022)   [place]    Feeling of Stress : Not at all  Social Connections: [place] (01/26/2022)   Social Connection and Isolation Panel [NHANES]    Frequency of Communication with Friends and Family: More than three times a week    Frequency of Social Gatherings with Friends and Family: More than three times a week  Attends Religious Services: More than 4 times per year    Active Member of Clubs or Organizations: Yes    Attends Archivist Meetings: More than 4 times per year    Marital Status: Married  Human resources officer Violence: Not At Risk (01/26/2022)   Humiliation, Afraid, Rape, and Kick questionnaire    Fear of Current or Ex-Partner: No    Emotionally Abused: No    Physically Abused: No    Sexually Abused: No    Past Surgical History:  Procedure Laterality Date   CARDIAC CATHETERIZATION     COLONOSCOPY  2011   F/V-tics/normal- 10 yr recall   FOOT SURGERY Right 2019   KNEE ARTHROSCOPY Right    SHOULDER SURGERY Bilateral     Family History  Problem Relation  Age of Onset   Pneumonia Mother        died 107 - hip fx/pna   Heart attack Father        died 73   Hypertension Sister        alive   Cancer Brother        died - agent orange exposure   Hypertension Brother        alive   Colon cancer Neg Hx    Colon polyps Neg Hx    Esophageal cancer Neg Hx    Rectal cancer Neg Hx    Stomach cancer Neg Hx     Allergies  Allergen Reactions   Bee Venom Anaphylaxis   Gabapentin Other (See Comments)    Hallucinations    Codeine Phosphate Itching   Lipitor [Atorvastatin] Other (See Comments)    Muscle cramps    Penicillins Other (See Comments)    Has patient had a PCN reaction causing immediate rash, facial/tongue/throat swelling, SOB or lightheadedness with hypotension: Unknown, childhood reaction Has patient had a PCN reaction causing severe rash involving mucus membranes or skin necrosis: No Has patient had a PCN reaction that required hospitalization No Has patient had a PCN reaction occurring within the last 10 years: No If all of the above answers are "NO", then may proceed with Cephalosporin use.     Current Outpatient Medications on File Prior to Visit  Medication Sig Dispense Refill   amLODipine (NORVASC) 5 MG tablet Take 1 tablet by mouth once daily 90 tablet 0   dicyclomine (BENTYL) 10 MG capsule Take 1 capsule (10 mg total) by mouth 4 (four) times daily -  before meals and at bedtime for 10 days. 40 capsule 0   EPINEPHrine 0.3 mg/0.3 mL IJ SOAJ injection Inject 0.3 mg into the muscle as needed for anaphylaxis. 1 each 5   losartan (COZAAR) 100 MG tablet Take 1 tablet by mouth once daily 90 tablet 0   meclizine (ANTIVERT) 25 MG tablet Take 1 tablet (25 mg total) by mouth 3 (three) times daily as needed for dizziness. 30 tablet 2   omeprazole (PRILOSEC) 20 MG capsule Take 1 capsule by mouth once daily in the morning 90 capsule 0   simvastatin (ZOCOR) 10 MG tablet TAKE 1 TABLET BY MOUTH AT BEDTIME 90 tablet 0   tamsulosin (FLOMAX) 0.4  MG CAPS capsule Take 1 capsule (0.4 mg total) by mouth daily. 90 capsule 3   No current facility-administered medications on file prior to visit.    BP 120/82   Pulse (!) 53   Temp 98.2 F (36.8 C) (Oral)   Ht 5' 10.5" (1.791 m)   Wt 254 lb (115.2 kg)  SpO2 97%   BMI 35.93 kg/m       Objective:   Physical Exam Vitals and nursing note reviewed.  Constitutional:      Appearance: Normal appearance. He is ill-appearing.  HENT:     Nose: Congestion and rhinorrhea present. Rhinorrhea is purulent.     Right Sinus: Maxillary sinus tenderness and frontal sinus tenderness present.     Left Sinus: Maxillary sinus tenderness and frontal sinus tenderness present.  Cardiovascular:     Rate and Rhythm: Normal rate and regular rhythm.     Pulses: Normal pulses.     Heart sounds: Normal heart sounds.  Pulmonary:     Effort: Pulmonary effort is normal.     Breath sounds: Normal breath sounds.  Musculoskeletal:        General: Normal range of motion.  Skin:    General: Skin is warm and dry.  Neurological:     General: No focal deficit present.     Mental Status: He is alert and oriented to person, place, and time.  Psychiatric:        Mood and Affect: Mood normal.        Behavior: Behavior normal.       Assessment & Plan:  1. Acute recurrent pansinusitis  - doxycycline (VIBRAMYCIN) 100 MG capsule; Take 1 capsule (100 mg total) by mouth 2 (two) times daily.  Dispense: 14 capsule; Refill: 0 - predniSONE (DELTASONE) 10 MG tablet; Take 1 tablet (10 mg total) by mouth daily with breakfast.  Dispense: 5 tablet; Refill: 0 - Follow up if no improvement in the next 2-3 days or sooner if fever develops  2. Acute cough  - benzonatate (TESSALON) 200 MG capsule; Take 1 capsule (200 mg total) by mouth 2 (two) times daily as needed for cough.  Dispense: 20 capsule; Refill: 0  Dorothyann Peng, NP

## 2022-07-17 ENCOUNTER — Other Ambulatory Visit: Payer: Self-pay | Admitting: Adult Health

## 2022-07-17 ENCOUNTER — Telehealth: Payer: Self-pay | Admitting: Adult Health

## 2022-07-17 DIAGNOSIS — R051 Acute cough: Secondary | ICD-10-CM

## 2022-07-17 NOTE — Telephone Encounter (Signed)
Still experiencing symptoms, gotten worse from visit on 07/11/22. Cough is more persistent and troublesome, chest hurts from cough. Requests medication to help with symptoms

## 2022-07-17 NOTE — Telephone Encounter (Signed)
Sx has gotten worse. Pt stated that the cough is not stop and now feels like he is "gasping for air". Pt productive cough with yellow phlegm. Pt doesn't know if he is running a fever but is sweating per spouse. Pt has tried OTC medication with no relief.

## 2022-07-18 ENCOUNTER — Ambulatory Visit (INDEPENDENT_AMBULATORY_CARE_PROVIDER_SITE_OTHER): Payer: PPO | Admitting: Internal Medicine

## 2022-07-18 ENCOUNTER — Ambulatory Visit (INDEPENDENT_AMBULATORY_CARE_PROVIDER_SITE_OTHER): Payer: PPO

## 2022-07-18 ENCOUNTER — Encounter: Payer: Self-pay | Admitting: Internal Medicine

## 2022-07-18 VITALS — BP 128/66 | HR 72 | Temp 97.6°F | Ht 70.5 in | Wt 255.4 lb

## 2022-07-18 DIAGNOSIS — J209 Acute bronchitis, unspecified: Secondary | ICD-10-CM | POA: Diagnosis not present

## 2022-07-18 DIAGNOSIS — R052 Subacute cough: Secondary | ICD-10-CM

## 2022-07-18 DIAGNOSIS — R059 Cough, unspecified: Secondary | ICD-10-CM | POA: Diagnosis not present

## 2022-07-18 NOTE — Telephone Encounter (Signed)
Pt has been scheduled.  °

## 2022-07-18 NOTE — Progress Notes (Signed)
Established Patient Office Visit     CC/Reason for Visit: Continued cough  HPI: David Vincent is a 72 y.o. male who is coming in today for the above mentioned reasons.  He was seen on 4/3 by PCP due to a cough.  He was treated with doxycycline, prednisone and Tessalon Perles.  If anything, he feels like he has gotten worse.  Has significant coughing spasms that makes sleeping difficult.  He used Delsym last night with significant relief.  Does not necessarily feel short of breath, has not had fever although he does feel chills at times.  His wife was sick before him.  No recent travel.   Past Medical/Surgical History: Past Medical History:  Diagnosis Date   BACK PAIN 03/10/2007   CAD (coronary artery disease)    A.  01/05/2002 Cath - nonobs dzs (LAD 20prox, RI 30-40 prox)   ED (erectile dysfunction)    GERD (gastroesophageal reflux disease)    History of mumps orchitis    Hyperlipidemia    HYPERTENSION 11/15/2006   INGUINAL HERNIA, RIGHT 12/05/2009   LEG CRAMPS 12/03/2008   SLEEP APNEA 11/16/2008   Sleep apnea    no cpap    Unstable angina    VERTIGO, POSITIONAL 08/08/2009    Past Surgical History:  Procedure Laterality Date   CARDIAC CATHETERIZATION     COLONOSCOPY  2011   F/V-tics/normal- 10 yr recall   FOOT SURGERY Right 2019   KNEE ARTHROSCOPY Right    SHOULDER SURGERY Bilateral     Social History:  reports that he has never smoked. He has never used smokeless tobacco. He reports that he does not drink alcohol and does not use drugs.  Allergies: Allergies  Allergen Reactions   Bee Venom Anaphylaxis   Gabapentin Other (See Comments)    Hallucinations    Codeine Phosphate Itching   Lipitor [Atorvastatin] Other (See Comments)    Muscle cramps    Penicillins Other (See Comments)    Has patient had a PCN reaction causing immediate rash, facial/tongue/throat swelling, SOB or lightheadedness with hypotension: Unknown, childhood reaction Has patient had a PCN  reaction causing severe rash involving mucus membranes or skin necrosis: No Has patient had a PCN reaction that required hospitalization No Has patient had a PCN reaction occurring within the last 10 years: No If all of the above answers are "NO", then may proceed with Cephalosporin use.     Family History:  Family History  Problem Relation Age of Onset   Pneumonia Mother        died 56 - hip fx/pna   Heart attack Father        died 37   Hypertension Sister        alive   Cancer Brother        died - agent orange exposure   Hypertension Brother        alive   Colon cancer Neg Hx    Colon polyps Neg Hx    Esophageal cancer Neg Hx    Rectal cancer Neg Hx    Stomach cancer Neg Hx      Current Outpatient Medications:    amLODipine (NORVASC) 5 MG tablet, Take 1 tablet by mouth once daily, Disp: 90 tablet, Rfl: 0   benzonatate (TESSALON) 200 MG capsule, Take 1 capsule (200 mg total) by mouth 2 (two) times daily as needed for cough., Disp: 20 capsule, Rfl: 0   doxycycline (VIBRAMYCIN) 100 MG capsule, Take 1 capsule (  100 mg total) by mouth 2 (two) times daily., Disp: 14 capsule, Rfl: 0   EPINEPHrine 0.3 mg/0.3 mL IJ SOAJ injection, Inject 0.3 mg into the muscle as needed for anaphylaxis., Disp: 1 each, Rfl: 5   losartan (COZAAR) 100 MG tablet, Take 1 tablet by mouth once daily, Disp: 90 tablet, Rfl: 0   meclizine (ANTIVERT) 25 MG tablet, Take 1 tablet (25 mg total) by mouth 3 (three) times daily as needed for dizziness., Disp: 30 tablet, Rfl: 2   omeprazole (PRILOSEC) 20 MG capsule, Take 1 capsule by mouth once daily in the morning, Disp: 90 capsule, Rfl: 0   predniSONE (DELTASONE) 10 MG tablet, Take 1 tablet (10 mg total) by mouth daily with breakfast., Disp: 5 tablet, Rfl: 0   simvastatin (ZOCOR) 10 MG tablet, TAKE 1 TABLET BY MOUTH AT BEDTIME, Disp: 90 tablet, Rfl: 0   tamsulosin (FLOMAX) 0.4 MG CAPS capsule, Take 1 capsule (0.4 mg total) by mouth daily., Disp: 90 capsule, Rfl: 3    dicyclomine (BENTYL) 10 MG capsule, Take 1 capsule (10 mg total) by mouth 4 (four) times daily -  before meals and at bedtime for 10 days., Disp: 40 capsule, Rfl: 0  Review of Systems:  Negative unless indicated in HPI.   Physical Exam: Vitals:   07/18/22 1418  BP: 128/66  Pulse: 72  Temp: 97.6 F (36.4 C)  TempSrc: Oral  SpO2: 98%  Weight: 255 lb 6.4 oz (115.8 kg)  Height: 5' 10.5" (1.791 m)    Body mass index is 36.13 kg/m.   Physical Exam Vitals reviewed.  Constitutional:      Appearance: Normal appearance.  HENT:     Right Ear: Tympanic membrane, ear canal and external ear normal.     Left Ear: Tympanic membrane, ear canal and external ear normal.     Mouth/Throat:     Mouth: Mucous membranes are moist.     Pharynx: Posterior oropharyngeal erythema present.  Eyes:     Conjunctiva/sclera: Conjunctivae normal.     Pupils: Pupils are equal, round, and reactive to light.  Cardiovascular:     Rate and Rhythm: Normal rate and regular rhythm.  Pulmonary:     Effort: Pulmonary effort is normal.     Breath sounds: Normal breath sounds.  Neurological:     Mental Status: He is alert.      Impression and Plan:  Subacute cough - Plan: DG Chest 2 View  Acute bronchitis, unspecified organism - Plan: DG Chest 2 View  -Given severity of coughing, will send for chest x-ray despite benign lung auscultation. -Have advised use of twice daily Mucinex and OTC cough syrup such as Delsym and daily antihistamine use in the meantime.  Time spent:30 minutes reviewing chart, interviewing and examining patient and formulating plan of care.     Chaya Jan, MD [facility] ([place])

## 2022-07-18 NOTE — Telephone Encounter (Signed)
Pt is follow up in the below message

## 2022-07-20 ENCOUNTER — Other Ambulatory Visit: Payer: Self-pay | Admitting: Adult Health

## 2022-07-23 ENCOUNTER — Other Ambulatory Visit: Payer: Self-pay | Admitting: Adult Health

## 2022-07-29 ENCOUNTER — Other Ambulatory Visit: Payer: Self-pay | Admitting: Adult Health

## 2022-07-29 DIAGNOSIS — I10 Essential (primary) hypertension: Secondary | ICD-10-CM

## 2022-08-01 ENCOUNTER — Other Ambulatory Visit: Payer: Self-pay | Admitting: Adult Health

## 2022-08-01 DIAGNOSIS — I10 Essential (primary) hypertension: Secondary | ICD-10-CM

## 2022-08-07 ENCOUNTER — Other Ambulatory Visit: Payer: Self-pay | Admitting: Adult Health

## 2022-08-07 DIAGNOSIS — E782 Mixed hyperlipidemia: Secondary | ICD-10-CM

## 2022-10-19 ENCOUNTER — Other Ambulatory Visit: Payer: Self-pay | Admitting: Adult Health

## 2022-10-29 ENCOUNTER — Other Ambulatory Visit: Payer: Self-pay | Admitting: Adult Health

## 2022-10-29 DIAGNOSIS — I10 Essential (primary) hypertension: Secondary | ICD-10-CM

## 2022-10-31 ENCOUNTER — Other Ambulatory Visit: Payer: Self-pay | Admitting: Adult Health

## 2022-11-02 ENCOUNTER — Other Ambulatory Visit: Payer: Self-pay | Admitting: Adult Health

## 2022-11-02 DIAGNOSIS — E782 Mixed hyperlipidemia: Secondary | ICD-10-CM

## 2023-01-19 ENCOUNTER — Other Ambulatory Visit: Payer: Self-pay | Admitting: Adult Health

## 2023-01-20 ENCOUNTER — Other Ambulatory Visit: Payer: Self-pay | Admitting: Adult Health

## 2023-01-20 DIAGNOSIS — N401 Enlarged prostate with lower urinary tract symptoms: Secondary | ICD-10-CM

## 2023-01-27 ENCOUNTER — Other Ambulatory Visit: Payer: Self-pay | Admitting: Adult Health

## 2023-01-30 ENCOUNTER — Other Ambulatory Visit: Payer: Self-pay | Admitting: Adult Health

## 2023-01-31 ENCOUNTER — Ambulatory Visit: Payer: PPO | Admitting: Family Medicine

## 2023-01-31 DIAGNOSIS — Z0000 Encounter for general adult medical examination without abnormal findings: Secondary | ICD-10-CM

## 2023-01-31 NOTE — Patient Instructions (Addendum)
I really enjoyed getting to talk with you today! I am available on Tuesdays and Thursdays for virtual visits if you have any questions or concerns, or if I can be of any further assistance.   CHECKLIST FROM ANNUAL WELLNESS VISIT:  -Follow up (please call to schedule if not scheduled after visit):   -yearly for annual wellness visit with primary care office  Here is a list of your preventive care/health maintenance measures and the plan for each if any are due:  PLAN For any measures below that may be due:   Health Maintenance  Topic Date Due   Zoster Vaccines- Shingrix (1 of 2) 12/10/2000   INFLUENZA VACCINE  11/08/2022   COVID-19 Vaccine (3 - 2023-24 season) 12/09/2022   Medicare Annual Wellness (AWV)  01/31/2024   DTaP/Tdap/Td (2 - Td or Tdap) 03/06/2025   Pneumonia Vaccine 34+ Years old  Completed   Hepatitis C Screening  Completed   HPV VACCINES  Aged Out   Colonoscopy  Discontinued    -See a dentist at least yearly  -Get your eyes checked and then per your eye specialist's recommendations  -Other issues addressed today:   -I have included below further information regarding a healthy whole foods based diet, physical activity guidelines for adults, stress management and opportunities for social connections. I hope you find this information useful.   -----------------------------------------------------------------------------------------------------------------------------------------------------------------------------------------------------------------------------------------------------------  NUTRITION: -eat real food: lots of colorful vegetables (half the plate) and fruits -5-7 servings of vegetables and fruits per day (fresh or steamed is best), exp. 2 servings of vegetables with lunch and dinner and 2 servings of fruit per day. Berries and greens such as kale and collards are great choices.  -consume on a regular basis: whole grains (make sure first ingredient on  label contains the word "whole"), fresh fruits, fish, nuts, seeds, healthy oils (such as olive oil, avocado oil, grape seed oil) -may eat small amounts of dairy and lean meat on occasion, but avoid processed meats such as ham, bacon, lunch meat, etc. -drink water -try to avoid fast food and pre-packaged foods, processed meat -most experts advise limiting sodium to < 2300mg  per day, should limit further is any chronic conditions such as high blood pressure, heart disease, diabetes, etc. The American Heart Association advised that < 1500mg  is is ideal -try to avoid foods that contain any ingredients with names you do not recognize  -try to avoid sugar/sweets (except for the natural sugar that occurs in fresh fruit) -try to avoid sweet drinks -try to avoid white rice, white bread, pasta (unless whole grain), white or yellow potatoes  EXERCISE GUIDELINES FOR ADULTS: -if you wish to increase your physical activity, do so gradually and with the approval of your doctor -STOP and seek medical care immediately if you have any chest pain, chest discomfort or trouble breathing when starting or increasing exercise  -move and stretch your body, legs, feet and arms when sitting for long periods -Physical activity guidelines for optimal health in adults: -least 150 minutes per week of aerobic exercise (can talk, but not sing) once approved by your doctor, 20-30 minutes of sustained activity or two 10 minute episodes of sustained activity every day.  -resistance training at least 2 days per week if approved by your doctor -balance exercises 3+ days per week:   Stand somewhere where you have something sturdy to hold onto if you lose balance.    1) lift up on toes, start with 5x per day and work up to 20x  2) stand and lift on leg straight out to the side so that foot is a few inches of the floor, start with 5x each side and work up to 20x each side   3) stand on one foot, start with 5 seconds each side and work  up to 20 seconds on each side  If you need ideas or help with getting more active:  -Silver sneakers https://tools.silversneakers.com  -Walk with a Doc: http://www.duncan-[email or web address]/  -try to include resistance (weight lifting/strength building) and balance exercises twice per week: or the following link for ideas: http://castillo-Clemence.com/  BuyDucts.dk  STRESS MANAGEMENT: -can try meditating, or just sitting quietly with deep breathing while intentionally relaxing all parts of your body for 5 minutes daily -if you need further help with stress, anxiety or depression please follow up with your primary doctor or contact the wonderful folks at [facility]: [phone]  SOCIAL CONNECTIONS: -options in [place] if you wish to engage in more social and exercise related activities:  -Silver sneakers https://tools.silversneakers.com  -Walk with a Doc: http://www.duncan-williams.[email or web address]/  -Check out the [facility] Active Adults 50+ section on the [place] [employer] (hiking clubs, book clubs, cards and games, chess, exercise classes, aquatic classes and much more) - see the website for details: https://www.[place].gov/departments/parks-recreation/active-adults50  -YouTube has lots of exercise videos for different ages and abilities as well  -[facility] (a variety of indoor and outdoor inperson activities for adults). [phone]. [address]  -Virtual Online Classes (a variety of topics): see seniorplanet.org or call [phone]  -consider volunteering at a school, hospice center, church, senior center or elsewhere    ADVANCED HEALTHCARE DIRECTIVES:  [place] Advanced Directives assistance:   ExpressWeek.com.cy  Everyone should have advanced health care directives in place. This is so that you get  the care you want, should you ever be in a situation where you are unable to make your own medical decisions.   From the [place] Advanced Directive Website: "Advance Health Care Directives are legal documents in which you give written instructions about your health care if, in the future, you cannot speak for yourself.   A health care power of attorney allows you to name a person you trust to make your health care decisions if you cannot make them yourself. A declaration of a desire for a natural death (or living will) is document, which states that you desire not to have your life prolonged by extraordinary measures if you have a terminal or incurable illness or if you are in a vegetative state. An advance instruction for mental health treatment makes a declaration of instructions, information and preferences regarding your mental health treatment. It also states that you are aware that the advance instruction authorizes a mental health treatment provider to act according to your wishes. It may also outline your consent or refusal of mental health treatment. A declaration of an anatomical gift allows anyone over the age of 7 to make a gift by will, organ donor card or other document."   Please see the following website or an elder law attorney for forms, FAQs and for completion of advanced directives: [place] Health Care Directives Advance Health Care Directives (http://guzman.com/)  Or copy and paste the following to your web browser: [email or web address]

## 2023-01-31 NOTE — Progress Notes (Signed)
 Patient unable to obtain vital signs due to telehealth visit

## 2023-01-31 NOTE — Progress Notes (Signed)
PATIENT CHECK-IN and HEALTH RISK ASSESSMENT QUESTIONNAIRE:  -completed by phone/video for upcoming Medicare Preventive Visit  Pre-Visit Check-in: 1)Vitals (height, wt, BP, etc) - record in vitals section for visit on day of visit Request home vitals (wt, BP, etc.) and enter into vitals, THEN update Vital Signs SmartPhrase below at the top of the HPI. See below.  2)Review and Update Medications, Allergies PMH, Surgeries, Social history in Epic 3)Hospitalizations in the last year with date/reason? No  4)Review and Update Care Team (patient's specialists) in Epic 5) Complete PHQ9 in Epic  6) Complete Fall Screening in Epic 7)Review all Health Maintenance Due and order under PCP if not done.  Medicare Wellness Patient Questionnaire:  Answer theses question about your habits: Do you drink alcohol? No If yes, how many drinks do you have a day?N/A Have you ever smoked?No Quit date if applicable? N/A   How many packs a day do/did you smoke? N/A Do you use smokeless tobacco?No Do you use an illicit drugs?No Do you exercises? 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 Typical breakfast: Instant break Typical lunch: Sandwich Typical dinner: Varies Typical snacks: Varies   Beverages:  Tea  Answer theses question about you: Can you perform most household chores? Yes  Do you find it hard to follow a conversation in a noisy room? No Do you often ask people to speak up or repeat themselves?No Do you feel that you have a problem with memory? No  Do you balance your checkbook and or bank acounts?Yes  Do you feel safe at home? Yes  Last dentist visit? Last week  Do you need assistance with any of the following: Please note if so No  Driving?  Feeding yourself?  Getting from bed to chair?  Getting to the toilet?  Bathing or showering?  Dressing yourself?  Managing money?  Climbing a flight of stairs  Preparing  meals?    Do you have Advanced Directives in place (Living Will, Healthcare Power or Attorney)? No   Last eye Exam and location? Last year, My eye doctor   Do you currently use prescribed or non-prescribed narcotic or opioid pain medications? No  Do you have a history or close family history of breast, ovarian, tubal or peritoneal cancer or a family member with BRCA (breast cancer susceptibility 1 and 2) gene mutations? No     ----------------------------------------------------------------------------------------------------------------------------------------------------------------------------------------------------------------------  Because this visit was a virtual/telehealth visit, some criteria may be missing or patient reported. Any vitals not documented were not able to be obtained and vitals that have been documented are patient reported.    MEDICARE ANNUAL PREVENTIVE CARE VISIT WITH PROVIDER (Welcome to Medicare, initial annual wellness or annual wellness exam)  Virtual Visit via Phone Note  I connected with David Vincent on 01/31/23  by phone and verified that I am speaking with the correct person using two identifiers.  Location patient: home Location provider:work or home office Persons participating in the virtual visit: patient, provider  Concerns and/or follow up today: stable   See HM section in Epic for other details of completed HM.    ROS: negative for report of fevers, unintentional weight loss, vision changes, vision loss, hearing loss or change, chest pain, sob, hemoptysis, melena, hematochezia, hematuria, falls, bleeding or bruising, thoughts of suicide or self harm, memory loss  Patient-completed extensive health risk assessment - reviewed and discussed with the patient: See Health Risk Assessment  completed with patient prior to the visit either above or in recent phone note. This was reviewed in detailed with the patient today and appropriate  recommendations, orders and referrals were placed as needed per Summary below and patient instructions.   Review of Medical History: -PMH, PSH, Family History and current specialty and care providers reviewed and updated and listed below   Patient Care Team: [name], NP as PCP - General (Family Medicine)   Past Medical History:  Diagnosis Date   BACK PAIN 03/10/2007   CAD (coronary artery disease)    A.  01/05/2002 Cath - nonobs dzs (LAD 20prox, RI 30-40 prox)   ED (erectile dysfunction)    GERD (gastroesophageal reflux disease)    History of mumps orchitis    Hyperlipidemia    HYPERTENSION 11/15/2006   INGUINAL HERNIA, RIGHT 12/05/2009   LEG CRAMPS 12/03/2008   SLEEP APNEA 11/16/2008   Sleep apnea    no cpap    Unstable angina (HCC)    VERTIGO, POSITIONAL 08/08/2009    Past Surgical History:  Procedure Laterality Date   CARDIAC CATHETERIZATION     COLONOSCOPY  2011   F/V-tics/normal- 10 yr recall   FOOT SURGERY Right 2019   KNEE ARTHROSCOPY Right    SHOULDER SURGERY Bilateral     Social History   Socioeconomic History   Marital status: Married    Spouse name: Not on file   Number of children: Not on file   Years of education: Not on file   Highest education level: Not on file  Occupational History   Not on file  Tobacco Use   Smoking status: Never   Smokeless tobacco: Never  Vaping Use   Vaping status: Never Used  Substance and Sexual Activity   Alcohol use: No   Drug use: No   Sexual activity: Not on file  Other Topics Concern   Not on file  Social History Narrative   05/02/2011:  Currently unemployed.  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  Does not exercise or adhere to any specific diet.   Social Determinants of Health   Financial Resource Strain: Low Risk  (01/31/2023)   Overall Financial Resource Strain (CARDIA)    Difficulty of Paying Living Expenses: Not hard at all  Food Insecurity: No Food Insecurity (01/31/2023)   Hunger  Vital Sign    Worried About Running Out of Food in the Last Year: Never true    Ran Out of Food in the Last Year: Never true  Transportation Needs: No Transportation Needs (01/31/2023)   PRAPARE - Administrator, Civil Service (Medical): No    Lack of Transportation (Non-Medical): No  Physical Activity: Sufficiently Active (01/31/2023)   Exercise Vital Sign    Days of Exercise per Week: 7 days    Minutes of Exercise per Session: 30 min  Stress: No Stress Concern Present (01/31/2023)   [employer] of Occupational Health - Occupational Stress Questionnaire    Feeling of Stress : Not at all  Social Connections: Socially Isolated (01/31/2023)   Social Connection and Isolation Panel [NHANES]    Frequency of Communication with Friends and Family: Once a week    Frequency of Social Gatherings with Friends and Family: Never    Attends Religious Services: Never    Database administrator or Organizations: No    Attends Banker Meetings: Never    Marital Status: Married  Catering manager Violence: Not At  Risk (01/31/2023)   Humiliation, Afraid, Rape, and Kick questionnaire    Fear of Current or Ex-Partner: No    Emotionally Abused: No    Physically Abused: No    Sexually Abused: No    Family History  Problem Relation Age of Onset   Pneumonia Mother        died 25 - hip fx/pna   Heart attack Father        died 66   Hypertension Sister        alive   Cancer Brother        died - agent orange exposure   Hypertension Brother        alive   Colon cancer Neg Hx    Colon polyps Neg Hx    Esophageal cancer Neg Hx    Rectal cancer Neg Hx    Stomach cancer Neg Hx     Current Outpatient Medications on File Prior to Visit  Medication Sig Dispense Refill   amLODipine (NORVASC) 5 MG tablet Take 1 tablet by mouth once daily 90 tablet 0   dicyclomine (BENTYL) 10 MG capsule Take 1 capsule (10 mg total) by mouth 4 (four) times daily -  before meals and at bedtime  for 10 days. 40 capsule 0   EPINEPHrine 0.3 mg/0.3 mL IJ SOAJ injection Inject 0.3 mg into the muscle as needed for anaphylaxis. 1 each 5   losartan (COZAAR) 100 MG tablet Take 1 tablet by mouth once daily 90 tablet 0   meclizine (ANTIVERT) 25 MG tablet TAKE 1 TABLET BY MOUTH THREE TIMES DAILY AS NEEDED FOR DIZZINESS 30 tablet 0   omeprazole (PRILOSEC) 20 MG capsule Take 1 capsule by mouth once daily in the morning 90 capsule 0   simvastatin (ZOCOR) 10 MG tablet TAKE 1 TABLET BY MOUTH AT BEDTIME 90 tablet 0   tamsulosin (FLOMAX) 0.4 MG CAPS capsule Take 1 capsule by mouth once daily 90 capsule 0   No current facility-administered medications on file prior to visit.    Allergies  Allergen Reactions   Bee Venom Anaphylaxis   Gabapentin Other (See Comments)    Hallucinations    Codeine Phosphate Itching   Lipitor [Atorvastatin] Other (See Comments)    Muscle cramps    Penicillins Other (See Comments)    Has patient had a PCN reaction causing immediate rash, facial/tongue/throat swelling, SOB or lightheadedness with hypotension: Unknown, childhood reaction Has patient had a PCN reaction causing severe rash involving mucus membranes or skin necrosis: No Has patient had a PCN reaction that required hospitalization No Has patient had a PCN reaction occurring within the last 10 years: No If all of the above answers are "NO", then may proceed with Cephalosporin use.        Physical Exam Vitals requested from patient and listed below if patient had equipment and was able to obtain at home for this virtual visit: There were no vitals filed for this visit. Estimated body mass index is 36.13 kg/m as calculated from the following:   Height as of 07/18/22: 5' 10.5" (1.791 m).   Weight as of 07/18/22: 255 lb 6.4 oz (115.8 kg).  EKG (optional): deferred due to virtual visit  GENERAL: alert, oriented, no acute distress detected; full vision exam deferred due to pandemic and/or virtual  encounter  PSYCH/NEURO: pleasant and cooperative, no obvious depression or anxiety, speech and thought processing grossly intact, Cognitive function grossly intact  [employer] Office Visit from [facility]/16/2022 in [facility]  at [facility]  PHQ-9 Total Score 0           01/31/2023   12:14 PM 06/18/2022    3:26 PM 04/16/2022    1:26 PM 04/12/2022    9:43 AM 01/26/2022    3:33 PM  Depression screen PHQ 2/9  Decreased Interest 0 0 0 0 0  Down, Depressed, Hopeless 0 0 0 0 0  PHQ - 2 Score 0 0 0 0 0  Altered sleeping   0 0 0  Tired, decreased energy   0 0 0  Change in appetite   0 0 0  Feeling bad or failure about yourself    0 0 0  Trouble concentrating   0 0 0  Moving slowly or fidgety/restless   0 0 0  Suicidal thoughts   0 0 0  PHQ-9 Score   0 0 0  Difficult doing work/chores    Not difficult at all Not difficult at all       01/09/2022    4:55 PM 01/26/2022    3:35 PM 04/12/2022    9:44 AM 06/18/2022    3:25 PM 01/31/2023   12:14 PM  Fall Risk  Falls in the past year? 1 1 0 0 0  Was there an injury with Fall? 0 0 0 0 0  Was there an injury with Fall? - Comments  No injury followed by medical attention.     Fall Risk Category Calculator 1 1 0 0 0  Fall Risk Category (Retired) Low Low Low    (RETIRED) Patient Fall Risk Level Low fall risk Low fall risk Low fall risk    Patient at Risk for Falls Due to Other (Comment) No Fall Risks No Fall Risks Other (Comment) No Fall Risks  Fall risk Follow up Falls evaluation completed Falls prevention discussed Falls evaluation completed Falls evaluation completed Falls evaluation completed     SUMMARY AND PLAN:  Encounter for Medicare annual wellness exam   Discussed applicable health maintenance/preventive health measures and advised and referred or ordered per patient preferences: -discussed vaccines due and advised per cdc Health Maintenance  Topic Date Due   Zoster Vaccines- Shingrix (1 of 2) 12/10/2000    INFLUENZA VACCINE  11/08/2022   COVID-19 Vaccine (3 - 2023-24 season) 12/09/2022   Medicare Annual Wellness (AWV)  01/31/2024   DTaP/Tdap/Td (2 - Td or Tdap) 03/06/2025   Pneumonia Vaccine 38+ Years old  Completed   Hepatitis C Screening  Completed   HPV VACCINES  Aged Out   Colonoscopy  Discontinued     Education and counseling on the following was provided based on the above review of health and a plan/checklist for the patient, along with additional information discussed, was provided for the patient in the patient instructions :  -Advised on importance of completing advanced directives, discussed options for completing and provided information in patient instructions as well -Advised and counseled on a healthy lifestyle - he is interested in eating better -Reviewed patient's current diet. Advised and counseled on a whole foods based healthy diet. A summary of a healthy diet was provided in the Patient Instructions. Advised reducing/eliminating ultra processed/sugary foods and increasing whole healthy foods and water.  -reviewed patient's current physical activity level and discussed exercise guidelines for adults.  -Advise yearly dental visits at minimum and regular eye exams   Follow up: see patient instructions   Patient Instructions  I really enjoyed getting to talk with you today! I am available on Tuesdays  and Thursdays for virtual visits if you have any questions or concerns, or if I can be of any further assistance.   CHECKLIST FROM ANNUAL WELLNESS VISIT:  -Follow up (please call to schedule if not scheduled after visit):   -yearly for annual wellness visit with primary care office  Here is a list of your preventive care/health maintenance measures and the plan for each if any are due:  PLAN For any measures below that may be due:   Health Maintenance  Topic Date Due   Zoster Vaccines- Shingrix (1 of 2) 12/10/2000   INFLUENZA VACCINE  11/08/2022   COVID-19 Vaccine (3  - 2023-24 season) 12/09/2022   Medicare Annual Wellness (AWV)  01/31/2024   DTaP/Tdap/Td (2 - Td or Tdap) 03/06/2025   Pneumonia Vaccine 54+ Years old  Completed   Hepatitis C Screening  Completed   HPV VACCINES  Aged Out   Colonoscopy  Discontinued    -See a dentist at least yearly  -Get your eyes checked and then per your eye specialist's recommendations  -Other issues addressed today:   -I have included below further information regarding a healthy whole foods based diet, physical activity guidelines for adults, stress management and opportunities for social connections. I hope you find this information useful.   -----------------------------------------------------------------------------------------------------------------------------------------------------------------------------------------------------------------------------------------------------------  NUTRITION: -eat real food: lots of colorful vegetables (half the plate) and fruits -5-7 servings of vegetables and fruits per day (fresh or steamed is best), exp. 2 servings of vegetables with lunch and dinner and 2 servings of fruit per day. Berries and greens such as kale and collards are great choices.  -consume on a regular basis: whole grains (make sure first ingredient on label contains the word "whole"), fresh fruits, fish, nuts, seeds, healthy oils (such as olive oil, avocado oil, grape seed oil) -may eat small amounts of dairy and lean meat on occasion, but avoid processed meats such as ham, bacon, lunch meat, etc. -drink water -try to avoid fast food and pre-packaged foods, processed meat -most experts advise limiting sodium to < 2300mg  per day, should limit further is any chronic conditions such as high blood pressure, heart disease, diabetes, etc. The American Heart Association advised that < 1500mg  is is ideal -try to avoid foods that contain any ingredients with names you do not recognize  -try to avoid  sugar/sweets (except for the natural sugar that occurs in fresh fruit) -try to avoid sweet drinks -try to avoid white rice, white bread, pasta (unless whole grain), white or yellow potatoes  EXERCISE GUIDELINES FOR ADULTS: -if you wish to increase your physical activity, do so gradually and with the approval of your doctor -STOP and seek medical care immediately if you have any chest pain, chest discomfort or trouble breathing when starting or increasing exercise  -move and stretch your body, legs, feet and arms when sitting for long periods -Physical activity guidelines for optimal health in adults: -least 150 minutes per week of aerobic exercise (can talk, but not sing) once approved by your doctor, 20-30 minutes of sustained activity or two 10 minute episodes of sustained activity every day.  -resistance training at least 2 days per week if approved by your doctor -balance exercises 3+ days per week:   Stand somewhere where you have something sturdy to hold onto if you lose balance.    1) lift up on toes, start with 5x per day and work up to 20x   2) stand and lift on leg straight out to the side so  that foot is a few inches of the floor, start with 5x each side and work up to 20x each side   3) stand on one foot, start with 5 seconds each side and work up to 20 seconds on each side  If you need ideas or help with getting more active:  -Silver sneakers https://tools.silversneakers.com  -Walk with a Doc: http://www.duncan-williams.com/  -try to include resistance (weight lifting/strength building) and balance exercises twice per week: or the following link for ideas: http://castillo-Ganus.com/  BuyDucts.dk  STRESS MANAGEMENT: -can try meditating, or just sitting quietly with deep breathing while intentionally relaxing all parts of your body for 5 minutes daily -if you need further help with stress,  anxiety or depression please follow up with your primary doctor or contact the wonderful folks at [facility]: [phone]  SOCIAL CONNECTIONS: -options in [place] if you wish to engage in more social and exercise related activities:  -Silver sneakers https://tools.silversneakers.com  -Walk with a Doc: http://www.duncan-williams.com/  -Check out the [facility] Active Adults 50+ section on the [place] of [employer] (hiking clubs, book clubs, cards and games, chess, exercise classes, aquatic classes and much more) - see the website for details: https://www.[place].gov/departments/parks-recreation/active-adults50  -YouTube has lots of exercise videos for different ages and abilities as well  -[facility] (a variety of indoor and outdoor inperson activities for adults). 6067029968. [address].  -Virtual Online Classes (a variety of topics): see seniorplanet.org or call [phone]  -consider volunteering at a school, hospice center, church, senior center or elsewhere    ADVANCED HEALTHCARE DIRECTIVES:  [place] Advanced Directives assistance:   ExpressWeek.com.cy  Everyone should have advanced health care directives in place. This is so that you get the care you want, should you ever be in a situation where you are unable to make your own medical decisions.   From the [place] Advanced Directive Website: "Advance Health Care Directives are legal documents in which you give written instructions about your health care if, in the future, you cannot speak for yourself.   A health care power of attorney allows you to name a person you trust to make your health care decisions if you cannot make them yourself. A declaration of a desire for a natural death (or living will) is document, which states that you desire not to have your life prolonged by extraordinary measures if you have a terminal or  incurable illness or if you are in a vegetative state. An advance instruction for mental health treatment makes a declaration of instructions, information and preferences regarding your mental health treatment. It also states that you are aware that the advance instruction authorizes a mental health treatment provider to act according to your wishes. It may also outline your consent or refusal of mental health treatment. A declaration of an anatomical gift allows anyone over the age of 44 to make a gift by will, organ donor card or other document."   Please see the following website or an elder law attorney for forms, FAQs and for completion of advanced directives: [place] TEFL teacher Health Care Directives Advance Health Care Directives (http://guzman.com/)  Or copy and paste the following to your web browser: PoshChat.fi         Terressa Koyanagi, DO

## 2023-02-03 ENCOUNTER — Other Ambulatory Visit: Payer: Self-pay | Admitting: Adult Health

## 2023-02-03 DIAGNOSIS — I10 Essential (primary) hypertension: Secondary | ICD-10-CM

## 2023-02-06 ENCOUNTER — Other Ambulatory Visit: Payer: Self-pay | Admitting: Adult Health

## 2023-02-06 ENCOUNTER — Encounter: Payer: Self-pay | Admitting: Adult Health

## 2023-02-06 ENCOUNTER — Ambulatory Visit (INDEPENDENT_AMBULATORY_CARE_PROVIDER_SITE_OTHER): Payer: PPO | Admitting: Adult Health

## 2023-02-06 VITALS — BP 120/70 | HR 63 | Temp 98.0°F | Ht 69.0 in | Wt 259.0 lb

## 2023-02-06 DIAGNOSIS — N401 Enlarged prostate with lower urinary tract symptoms: Secondary | ICD-10-CM | POA: Diagnosis not present

## 2023-02-06 DIAGNOSIS — K219 Gastro-esophageal reflux disease without esophagitis: Secondary | ICD-10-CM

## 2023-02-06 DIAGNOSIS — G4733 Obstructive sleep apnea (adult) (pediatric): Secondary | ICD-10-CM | POA: Diagnosis not present

## 2023-02-06 DIAGNOSIS — I10 Essential (primary) hypertension: Secondary | ICD-10-CM

## 2023-02-06 DIAGNOSIS — E782 Mixed hyperlipidemia: Secondary | ICD-10-CM

## 2023-02-06 DIAGNOSIS — Z0000 Encounter for general adult medical examination without abnormal findings: Secondary | ICD-10-CM | POA: Diagnosis not present

## 2023-02-06 DIAGNOSIS — Z23 Encounter for immunization: Secondary | ICD-10-CM | POA: Diagnosis not present

## 2023-02-06 DIAGNOSIS — R351 Nocturia: Secondary | ICD-10-CM | POA: Diagnosis not present

## 2023-02-06 LAB — LIPID PANEL
Cholesterol: 121 mg/dL (ref 0–200)
HDL: 38.5 mg/dL — ABNORMAL LOW (ref >39.00)
LDL Cholesterol: 52 mg/dL (ref 0–99)
NonHDL: 82.31
Total CHOL/HDL Ratio: 3
Triglycerides: 152 mg/dL — ABNORMAL HIGH (ref 0.0–149.0)
VLDL: 30.4 mg/dL (ref 0.0–40.0)

## 2023-02-06 LAB — COMPREHENSIVE METABOLIC PANEL
ALT: 16 U/L (ref 0–53)
AST: 19 U/L (ref 0–37)
Albumin: 4.4 g/dL (ref 3.5–5.2)
Alkaline Phosphatase: 76 U/L (ref 39–117)
BUN: 17 mg/dL (ref 6–23)
CO2: 29 meq/L (ref 19–32)
Calcium: 9.6 mg/dL (ref 8.4–10.5)
Chloride: 105 meq/L (ref 96–112)
Creatinine, Ser: 1.01 mg/dL (ref 0.40–1.50)
GFR: 74.48 mL/min (ref >60.00)
Glucose, Bld: 97 mg/dL (ref 70–99)
Potassium: 4.2 meq/L (ref 3.5–5.1)
Sodium: 141 meq/L (ref 135–145)
Total Bilirubin: 0.9 mg/dL (ref 0.2–1.2)
Total Protein: 6.8 g/dL (ref 6.0–8.3)

## 2023-02-06 LAB — PSA: PSA: 0.88 ng/mL (ref 0.10–4.00)

## 2023-02-06 LAB — CBC
HCT: 40.7 % (ref 39.0–52.0)
Hemoglobin: 13.8 g/dL (ref 13.0–17.0)
MCHC: 33.9 g/dL (ref 30.0–36.0)
MCV: 96.4 fL (ref 78.0–100.0)
Platelets: 241 10*3/uL (ref 150.0–400.0)
RBC: 4.22 Mil/uL (ref 4.22–5.81)
RDW: 13.4 % (ref 11.5–15.5)
WBC: 6 10*3/uL (ref 4.0–10.5)

## 2023-02-06 LAB — TSH: TSH: 0.94 u[IU]/mL (ref 0.35–5.50)

## 2023-02-06 MED ORDER — TAMSULOSIN HCL 0.4 MG PO CAPS: 0.8000 mg | ORAL_CAPSULE | Freq: Every day | ORAL | 3 refills | Status: DC

## 2023-02-06 MED ORDER — AMLODIPINE BESYLATE 5 MG PO TABS: 5.0000 mg | ORAL_TABLET | Freq: Every day | ORAL | 3 refills | Status: DC

## 2023-02-06 MED ORDER — SIMVASTATIN 10 MG PO TABS: 10.0000 mg | ORAL_TABLET | Freq: Every day | ORAL | 3 refills | Status: DC

## 2023-02-06 NOTE — Patient Instructions (Signed)
It was great seeing you today   We will follow up with you regarding your lab work   Please let me know if you need anything   

## 2023-02-06 NOTE — Progress Notes (Signed)
Subjective:    Patient ID: David Vincent, male    DOB: July 19, 1950, 72 y.o.   MRN: [id number]  HPI Patient presents for yearly preventative medicine examination. He is a pleasant 72 year old male who  has a past medical history of BACK PAIN (03/10/2007), CAD (coronary artery disease), ED (erectile dysfunction), GERD (gastroesophageal reflux disease), History of mumps orchitis, Hyperlipidemia, HYPERTENSION (11/15/2006), INGUINAL HERNIA, RIGHT (12/05/2009), LEG CRAMPS (12/03/2008), SLEEP APNEA (11/16/2008), Sleep apnea, Unstable angina (HCC), and VERTIGO, POSITIONAL (08/08/2009).  Hypertension-managed with Norvasc 5 mg daily and Cozaar 100 mg daily.  He denies dizziness, lightheadedness, chest pain, or shortness of breath BP Readings from Last 3 Encounters:  02/06/23 120/70  07/18/22 128/66  07/11/22 120/82   GERD-managed with Prilosec 20 mg daily  Hyperlipidemia- managed with Simvastatin 10 mg daily. He continues to have leg cramps mostly at night.  Lab Results  Component Value Date   CHOL 124 02/01/2022   HDL 45.10 02/01/2022   LDLCALC 64 02/01/2022   TRIG 75.0 02/01/2022   CHOLHDL 3 02/01/2022    OSA-refuses to wear CPAP  BPH-managed with Flomax 0.4 mg daily. He has been out of the medication. Currently getting up 2-3 times a night   All immunizations and health maintenance protocols were reviewed with the patient and needed orders were placed.  Appropriate screening laboratory values were ordered for the patient including screening of hyperlipidemia, renal function and hepatic function. If indicated by BPH, a PSA was ordered.  Medication reconciliation,  past medical history, social history, problem list and allergies were reviewed in detail with the patient  Goals were established with regard to weight loss, exercise, and  diet in compliance with medications  He is up to date on routine colon cancer screening   Review of Systems  Constitutional: Negative.   HENT: Negative.     Eyes: Negative.   Respiratory: Negative.    Cardiovascular: Negative.   Gastrointestinal: Negative.   Endocrine: Negative.   Genitourinary: Negative.   Musculoskeletal:  Positive for arthralgias and back pain.  Skin: Negative.   Allergic/Immunologic: Negative.   Neurological: Negative.   Hematological: Negative.   Psychiatric/Behavioral: Negative.    All other systems reviewed and are negative.  Past Medical History:  Diagnosis Date   BACK PAIN 03/10/2007   CAD (coronary artery disease)    A.  01/05/2002 Cath - nonobs dzs (LAD 20prox, RI 30-40 prox)   ED (erectile dysfunction)    GERD (gastroesophageal reflux disease)    History of mumps orchitis    Hyperlipidemia    HYPERTENSION 11/15/2006   INGUINAL HERNIA, RIGHT 12/05/2009   LEG CRAMPS 12/03/2008   SLEEP APNEA 11/16/2008   Sleep apnea    no cpap    Unstable angina (HCC)    VERTIGO, POSITIONAL 08/08/2009    Social History   Socioeconomic History   Marital status: Married    Spouse name: Not on file   Number of children: Not on file   Years of education: Not on file   Highest education level: Not on file  Occupational History   Not on file  Tobacco Use   Smoking status: Never   Smokeless tobacco: Never  Vaping Use   Vaping status: Never Used  Substance and Sexual Activity   Alcohol use: No   Drug use: No   Sexual activity: Not on file  Other Topics Concern   Not on file  Social History Narrative   05/02/2011:  Currently unemployed.  Prev worked in  a print shop.  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  Does not exercise or adhere to any specific diet.   Social Determinants of Health   Financial Resource Strain: Low Risk  (01/31/2023)   Overall Financial Resource Strain (CARDIA)    Difficulty of Paying Living Expenses: Not hard at all  Food Insecurity: No Food Insecurity (01/31/2023)   Hunger Vital Sign    Worried About Running Out of Food in the Last Year: Never true    Ran Out of Food in the Last Year: Never true   Transportation Needs: No Transportation Needs (01/31/2023)   PRAPARE - Administrator, Civil Service (Medical): No    Lack of Transportation (Non-Medical): No  Physical Activity: Sufficiently Active (01/31/2023)   Exercise Vital Sign    Days of Exercise per Week: 7 days    Minutes of Exercise per Session: 30 min  Stress: No Stress Concern Present (01/31/2023)   [employer] of Occupational Health - Occupational Stress Questionnaire    Feeling of Stress : Not at all  Social Connections: Socially Isolated (01/31/2023)   Social Connection and Isolation Panel [NHANES]    Frequency of Communication with Friends and Family: Once a week    Frequency of Social Gatherings with Friends and Family: Never    Attends Religious Services: Never    Database administrator or Organizations: No    Attends Banker Meetings: Never    Marital Status: Married  Catering manager Violence: Not At Risk (01/31/2023)   Humiliation, Afraid, Rape, and Kick questionnaire    Fear of Current or Ex-Partner: No    Emotionally Abused: No    Physically Abused: No    Sexually Abused: No    Past Surgical History:  Procedure Laterality Date   CARDIAC CATHETERIZATION     COLONOSCOPY  2011   F/V-tics/normal- 10 yr recall   FOOT SURGERY Right 2019   KNEE ARTHROSCOPY Right    SHOULDER SURGERY Bilateral     Family History  Problem Relation Age of Onset   Pneumonia Mother        died 26 - hip fx/pna   Heart attack Father        died 21   Hypertension Sister        alive   Cancer Brother        died - agent orange exposure   Hypertension Brother        alive   Colon cancer Neg Hx    Colon polyps Neg Hx    Esophageal cancer Neg Hx    Rectal cancer Neg Hx    Stomach cancer Neg Hx     Allergies  Allergen Reactions   Bee Venom Anaphylaxis   Gabapentin Other (See Comments)    Hallucinations    Codeine Phosphate Itching   Lipitor [Atorvastatin] Other (See Comments)    Muscle  cramps    Penicillins Other (See Comments)    Has patient had a PCN reaction causing immediate rash, facial/tongue/throat swelling, SOB or lightheadedness with hypotension: Unknown, childhood reaction Has patient had a PCN reaction causing severe rash involving mucus membranes or skin necrosis: No Has patient had a PCN reaction that required hospitalization No Has patient had a PCN reaction occurring within the last 10 years: No If all of the above answers are "NO", then may proceed with Cephalosporin use.     Current Outpatient Medications on File Prior to Visit  Medication Sig Dispense Refill   amLODipine (  NORVASC) 5 MG tablet Take 1 tablet by mouth once daily 90 tablet 0   dicyclomine (BENTYL) 10 MG capsule Take 1 capsule (10 mg total) by mouth 4 (four) times daily -  before meals and at bedtime for 10 days. 40 capsule 0   EPINEPHrine 0.3 mg/0.3 mL IJ SOAJ injection Inject 0.3 mg into the muscle as needed for anaphylaxis. 1 each 5   losartan (COZAAR) 100 MG tablet Take 1 tablet by mouth once daily 90 tablet 0   meclizine (ANTIVERT) 25 MG tablet TAKE 1 TABLET BY MOUTH THREE TIMES DAILY AS NEEDED FOR DIZZINESS 30 tablet 0   omeprazole (PRILOSEC) 20 MG capsule Take 1 capsule by mouth once daily in the morning 90 capsule 0   simvastatin (ZOCOR) 10 MG tablet TAKE 1 TABLET BY MOUTH AT BEDTIME 90 tablet 0   tamsulosin (FLOMAX) 0.4 MG CAPS capsule Take 1 capsule by mouth once daily 90 capsule 0   No current facility-administered medications on file prior to visit.    BP 120/70   Pulse 63   Temp 98 F (36.7 C) (Oral)   Ht 5\' 9"  (1.753 m)   Wt 259 lb (117.5 kg)   SpO2 97%   BMI 38.25 kg/m       Objective:   Physical Exam Vitals and nursing note reviewed.  Constitutional:      General: He is not in acute distress.    Appearance: Normal appearance. He is obese. He is not ill-appearing.  HENT:     Head: Normocephalic and atraumatic.     Right Ear: Tympanic membrane, ear canal and  external ear normal. There is no impacted cerumen.     Left Ear: Tympanic membrane, ear canal and external ear normal. There is no impacted cerumen.     Nose: Nose normal. No congestion or rhinorrhea.     Mouth/Throat:     Mouth: Mucous membranes are moist.     Pharynx: Oropharynx is clear.  Eyes:     Extraocular Movements: Extraocular movements intact.     Conjunctiva/sclera: Conjunctivae normal.     Pupils: Pupils are equal, round, and reactive to light.  Neck:     Vascular: No carotid bruit.  Cardiovascular:     Rate and Rhythm: Normal rate and regular rhythm.     Pulses: Normal pulses.     Heart sounds: No murmur heard.    No friction rub. No gallop.  Pulmonary:     Effort: Pulmonary effort is normal.     Breath sounds: Normal breath sounds.  Abdominal:     General: Abdomen is flat. Bowel sounds are normal. There is no distension.     Palpations: Abdomen is soft. There is no mass.     Tenderness: There is no abdominal tenderness. There is no guarding or rebound.     Hernia: No hernia is present.  Musculoskeletal:        General: Normal range of motion.     Cervical back: Normal range of motion and neck supple.  Lymphadenopathy:     Cervical: No cervical adenopathy.  Skin:    General: Skin is warm and dry.     Capillary Refill: Capillary refill takes less than 2 seconds.  Neurological:     General: No focal deficit present.     Mental Status: He is alert and oriented to person, place, and time.  Psychiatric:        Mood and Affect: Mood normal.  Behavior: Behavior normal.        Thought Content: Thought content normal.        Judgment: Judgment normal.       Assessment & Plan:  1. Routine general medical examination at a health care facility Today patient counseled on age appropriate routine health concerns for screening and prevention, each reviewed and up to date or declined. Immunizations reviewed and up to date or declined. Labs ordered and reviewed. Risk  factors for depression reviewed and negative. Hearing function and visual acuity are intact. ADLs screened and addressed as needed. Functional ability and level of safety reviewed and appropriate. Education, counseling and referrals performed based on assessed risks today. Patient provided with a copy of personalized plan for preventive services. - Follow up in one year  - Work on weight loss through diet and exercise   2. Essential hypertension - Well controlled. No change in medication  - Lipid panel; Future - TSH; Future - CBC; Future - Comprehensive metabolic panel; Future - amLODipine (NORVASC) 5 MG tablet; Take 1 tablet (5 mg total) by mouth daily.  Dispense: 90 tablet; Refill: 3  3. Gastroesophageal reflux disease without esophagitis - Continue PPI  - Lipid panel; Future - TSH; Future - CBC; Future - Comprehensive metabolic panel; Future  4. Mixed hyperlipidemia - Consider increase in statin  - Lipid panel; Future - TSH; Future - CBC; Future - Comprehensive metabolic panel; Future  5. OSA (obstructive sleep apnea)  - Lipid panel; Future - TSH; Future - CBC; Future - Comprehensive metabolic panel; Future  6. Benign prostatic hyperplasia with nocturia - Will increase flomax to 0.8 mg  - PSA; Future - tamsulosin (FLOMAX) 0.4 MG CAPS capsule; Take 2 capsules (0.8 mg total) by mouth daily.  Dispense: 180 capsule; Refill: 3  7. Need for influenza vaccination  - Flu Vaccine Trivalent High Dose (Fluad)  [name], NP

## 2023-03-18 ENCOUNTER — Telehealth: Payer: Self-pay | Admitting: Adult Health

## 2023-03-18 DIAGNOSIS — N401 Enlarged prostate with lower urinary tract symptoms: Secondary | ICD-10-CM

## 2023-03-18 NOTE — Telephone Encounter (Signed)
Pt called to ask that NP increase the dosage for the tamsulosin (FLOMAX) 0.4 MG CAPS capsule   .Marland KitchenMarland KitchenAnd send to:  [facility] [address], [place] - [address] Phone: [phone]  Fax: [phone]     Pt is aware NP is OOO on Mondays.

## 2023-03-19 NOTE — Telephone Encounter (Signed)
Please advise 

## 2023-03-20 NOTE — Telephone Encounter (Signed)
Tried to call pt no answer. Lm for a return call.

## 2023-03-21 MED ORDER — TAMSULOSIN HCL 0.4 MG PO CAPS: 0.8000 mg | ORAL_CAPSULE | Freq: Every day | ORAL | 3 refills | Status: DC

## 2023-03-21 NOTE — Telephone Encounter (Signed)
Pt called thinking that the pill came in a 0.8 mg form. Pt stated that he was advised by the pharmacy that it is only in 0.4 mg. Pt just needs a refill. Rx sent to pharmacy,

## 2023-04-01 ENCOUNTER — Other Ambulatory Visit: Payer: Self-pay | Admitting: Adult Health

## 2023-04-01 DIAGNOSIS — N401 Enlarged prostate with lower urinary tract symptoms: Secondary | ICD-10-CM

## 2023-04-17 ENCOUNTER — Other Ambulatory Visit: Payer: Self-pay | Admitting: Adult Health

## 2023-04-21 ENCOUNTER — Other Ambulatory Visit: Payer: Self-pay | Admitting: Adult Health

## 2023-04-21 DIAGNOSIS — N401 Enlarged prostate with lower urinary tract symptoms: Secondary | ICD-10-CM

## 2023-06-26 ENCOUNTER — Encounter: Payer: Self-pay | Admitting: Adult Health

## 2023-06-26 ENCOUNTER — Ambulatory Visit: Admitting: Adult Health

## 2023-06-26 VITALS — BP 120/80 | HR 92 | Temp 98.3°F | Ht 69.0 in | Wt 263.0 lb

## 2023-06-26 DIAGNOSIS — N12 Tubulo-interstitial nephritis, not specified as acute or chronic: Secondary | ICD-10-CM | POA: Diagnosis not present

## 2023-06-26 DIAGNOSIS — R110 Nausea: Secondary | ICD-10-CM

## 2023-06-26 LAB — POCT URINALYSIS DIPSTICK
Bilirubin, UA: NEGATIVE
Blood, UA: POSITIVE
Glucose, UA: NEGATIVE
Ketones, UA: NEGATIVE
Nitrite, UA: NEGATIVE
Protein, UA: POSITIVE — AB
Spec Grav, UA: 1.01 (ref 1.010–1.025)
Urobilinogen, UA: 0.2 U/dL
pH, UA: 8 (ref 5.0–8.0)

## 2023-06-26 MED ORDER — CIPROFLOXACIN HCL 500 MG PO TABS: 500.0000 mg | ORAL_TABLET | Freq: Two times a day (BID) | ORAL | 0 refills | Status: AC

## 2023-06-26 MED ORDER — ONDANSETRON HCL 4 MG/2ML IJ SOLN
4.0000 mg | Freq: Once | INTRAMUSCULAR | Status: AC
  Administered 2023-06-26: 4 mg via INTRAMUSCULAR

## 2023-06-26 NOTE — Progress Notes (Signed)
 Subjective:    Patient ID: David Vincent, male    DOB: September 17, 1950, 73 y.o.   MRN: [id number]  HPI 73 year old male who  has a past medical history of BACK PAIN (03/10/2007), CAD (coronary artery disease), ED (erectile dysfunction), GERD (gastroesophageal reflux disease), History of mumps orchitis, Hyperlipidemia, HYPERTENSION (11/15/2006), INGUINAL HERNIA, RIGHT (12/05/2009), LEG CRAMPS (12/03/2008), SLEEP APNEA (11/16/2008), Sleep apnea, Unstable angina (HCC), and VERTIGO, POSITIONAL (08/08/2009).  He presents to the office today for an acute visit.   He reports that over the last 24 hours he has been experiencing frequent urination " I am going every 40 minutes" with urgency, odorous urine, chills, dysuria, lower pelvic pressure, low back pain, nausea, and feeling acutely ill.   He has not had any chills or vomiting.   Review of Systems See HPI   Past Medical History:  Diagnosis Date   BACK PAIN 03/10/2007   CAD (coronary artery disease)    A.  01/05/2002 Cath - nonobs dzs (LAD 20prox, RI 30-40 prox)   ED (erectile dysfunction)    GERD (gastroesophageal reflux disease)    History of mumps orchitis    Hyperlipidemia    HYPERTENSION 11/15/2006   INGUINAL HERNIA, RIGHT 12/05/2009   LEG CRAMPS 12/03/2008   SLEEP APNEA 11/16/2008   Sleep apnea    no cpap    Unstable angina (HCC)    VERTIGO, POSITIONAL 08/08/2009    Social History   Socioeconomic History   Marital status: Married    Spouse name: Not on file   Number of children: Not on file   Years of education: Not on file   Highest education level: Not on file  Occupational History   Not on file  Tobacco Use   Smoking status: Never   Smokeless tobacco: Never  Vaping Use   Vaping status: Never Used  Substance and Sexual Activity   Alcohol use: No   Drug use: No   Sexual activity: Not on file  Other Topics Concern   Not on file  Social History Narrative   05/02/2011:  Currently unemployed.  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  Lives @  home with his wife.  Does not exercise or adhere to any specific diet.   Social Drivers of Corporate investment banker Strain: Low Risk  (01/31/2023)   Overall Financial Resource Strain (CARDIA)    Difficulty of Paying Living Expenses: Not hard at all  Food Insecurity: No Food Insecurity (01/31/2023)   Hunger Vital Sign    Worried About Running Out of Food in the Last Year: Never true    Ran Out of Food in the Last Year: Never true  Transportation Needs: No Transportation Needs (01/31/2023)   PRAPARE - Administrator, Civil Service (Medical): No    Lack of Transportation (Non-Medical): No  Physical Activity: Sufficiently Active (01/31/2023)   Exercise Vital Sign    Days of Exercise per Week: 7 days    Minutes of Exercise per Session: 30 min  Stress: No Stress Concern Present (01/31/2023)   [employer] of Occupational Health - Occupational Stress Questionnaire    Feeling of Stress : Not at all  Social Connections: Socially Isolated (01/31/2023)   Social Connection and Isolation Panel [NHANES]    Frequency of Communication with Friends and Family: Once a week    Frequency of Social Gatherings with Friends and Family: Never    Attends Religious Services: Never    Database administrator or  Organizations: No    Attends Banker Meetings: Never    Marital Status: Married  Catering manager Violence: Not At Risk (01/31/2023)   Humiliation, Afraid, Rape, and Kick questionnaire    Fear of Current or Ex-Partner: No    Emotionally Abused: No    Physically Abused: No    Sexually Abused: No    Past Surgical History:  Procedure Laterality Date   CARDIAC CATHETERIZATION     COLONOSCOPY  2011   F/V-tics/normal- 10 yr recall   FOOT SURGERY Right 2019   KNEE ARTHROSCOPY Right    SHOULDER SURGERY Bilateral     Family History  Problem Relation Age of Onset   Pneumonia Mother        died 29 - hip fx/pna   Heart attack Father        died 51   Hypertension  Sister        alive   Cancer Brother        died - agent orange exposure   Hypertension Brother        alive   Colon cancer Neg Hx    Colon polyps Neg Hx    Esophageal cancer Neg Hx    Rectal cancer Neg Hx    Stomach cancer Neg Hx     Allergies  Allergen Reactions   Bee Venom Anaphylaxis   Gabapentin Other (See Comments)    Hallucinations    Codeine Phosphate Itching   Lipitor [Atorvastatin] Other (See Comments)    Muscle cramps    Penicillins Other (See Comments)    Has patient had a PCN reaction causing immediate rash, facial/tongue/throat swelling, SOB or lightheadedness with hypotension: Unknown, childhood reaction Has patient had a PCN reaction causing severe rash involving mucus membranes or skin necrosis: No Has patient had a PCN reaction that required hospitalization No Has patient had a PCN reaction occurring within the last 10 years: No If all of the above answers are "NO", then may proceed with Cephalosporin use.     Current Outpatient Medications on File Prior to Visit  Medication Sig Dispense Refill   amLODipine (NORVASC) 5 MG tablet Take 1 tablet (5 mg total) by mouth daily. 90 tablet 3   dicyclomine (BENTYL) 10 MG capsule Take 1 capsule (10 mg total) by mouth 4 (four) times daily -  before meals and at bedtime for 10 days. 40 capsule 0   EPINEPHrine 0.3 mg/0.3 mL IJ SOAJ injection Inject 0.3 mg into the muscle as needed for anaphylaxis. 1 each 5   losartan (COZAAR) 100 MG tablet Take 1 tablet by mouth once daily 90 tablet 0   meclizine (ANTIVERT) 25 MG tablet TAKE 1 TABLET BY MOUTH THREE TIMES DAILY AS NEEDED FOR DIZZINESS 30 tablet 0   omeprazole (PRILOSEC) 20 MG capsule Take 1 capsule by mouth once daily in the morning 90 capsule 0   simvastatin (ZOCOR) 10 MG tablet Take 1 tablet (10 mg total) by mouth at bedtime. 90 tablet 3   tamsulosin (FLOMAX) 0.4 MG CAPS capsule Take 1 capsule by mouth once daily 90 capsule 0   No current facility-administered  medications on file prior to visit.    BP 120/80   Pulse 92   Temp 98.3 F (36.8 C) (Oral)   Ht 5\' 9"  (1.753 m)   Wt 263 lb (119.3 kg)   SpO2 97%   BMI 38.84 kg/m       Objective:   Physical Exam Vitals and nursing note reviewed.  Constitutional:      Appearance: Normal appearance. He is ill-appearing.  Cardiovascular:     Rate and Rhythm: Regular rhythm.     Pulses: Normal pulses.     Heart sounds: Normal heart sounds.  Pulmonary:     Effort: Pulmonary effort is normal.     Breath sounds: Normal breath sounds.  Abdominal:     General: Abdomen is flat. Bowel sounds are normal.     Palpations: Abdomen is soft.     Tenderness: There is abdominal tenderness in the periumbilical area and suprapubic area. There is right CVA tenderness and left CVA tenderness.  Musculoskeletal:        General: Normal range of motion.  Skin:    General: Skin is warm and dry.  Neurological:     General: No focal deficit present.     Mental Status: He is alert and oriented to person, place, and time.  Psychiatric:        Mood and Affect: Mood normal.        Behavior: Behavior normal.        Thought Content: Thought content normal.        Judgment: Judgment normal.        Assessment & Plan:  1. Pyelonephritis (Primary)  - POC Urinalysis Dipstick + leuks, +blood, +protein, cloudy and amber colored. Negative for nitrite.  - Will treat for suspected Pylenoehritis. Advised rest and hydration. If symptoms worsen then go to the ER.  - Will call and check in on patient tomorrow.  - Urine Culture; Future - ciprofloxacin (CIPRO) 500 MG tablet; Take 1 tablet (500 mg total) by mouth 2 (two) times daily for 10 days.  Dispense: 20 tablet; Refill: 0 - CBC; Future - Basic Metabolic Panel; Future - Urine Culture  2. Nausea without vomiting  - ondansetron (ZOFRAN) injection 4 mg  [name], NP

## 2023-06-26 NOTE — Patient Instructions (Signed)
 I think you have a UTI. I have sent in an antibiotic called Cipro. Take this twice a day for 10 days. We will follow up with you once the urine culture is back   Stay hydrated as much as possible.

## 2023-06-27 LAB — CBC
HCT: 41.9 % (ref 39.0–52.0)
Hemoglobin: 14.3 g/dL (ref 13.0–17.0)
MCHC: 34.2 g/dL (ref 30.0–36.0)
MCV: 97.3 fl (ref 78.0–100.0)
Platelets: 254 10*3/uL (ref 150.0–400.0)
RBC: 4.31 Mil/uL (ref 4.22–5.81)
RDW: 13.5 % (ref 11.5–15.5)
WBC: 15.5 10*3/uL — ABNORMAL HIGH (ref 4.0–10.5)

## 2023-06-27 LAB — BASIC METABOLIC PANEL
BUN: 17 mg/dL (ref 6–23)
CO2: 25 meq/L (ref 19–32)
Calcium: 10.2 mg/dL (ref 8.4–10.5)
Chloride: 103 meq/L (ref 96–112)
Creatinine, Ser: 1.13 mg/dL (ref 0.40–1.50)
GFR: 64.92 mL/min (ref >60.00)
Glucose, Bld: 113 mg/dL — ABNORMAL HIGH (ref 70–99)
Potassium: 4.1 meq/L (ref 3.5–5.1)
Sodium: 138 meq/L (ref 135–145)

## 2023-06-28 ENCOUNTER — Other Ambulatory Visit: Payer: Self-pay

## 2023-06-28 DIAGNOSIS — N390 Urinary tract infection, site not specified: Secondary | ICD-10-CM

## 2023-06-28 LAB — URINE CULTURE
MICRO NUMBER:: 16221034
SPECIMEN QUALITY:: ADEQUATE

## 2023-07-07 ENCOUNTER — Other Ambulatory Visit: Payer: Self-pay | Admitting: Adult Health

## 2023-07-11 ENCOUNTER — Emergency Department (HOSPITAL_COMMUNITY)

## 2023-07-11 ENCOUNTER — Emergency Department (HOSPITAL_COMMUNITY)
Admission: EM | Admit: 2023-07-11 | Discharge: 2023-07-11 | Disposition: A | Discharge status: Home / Self Care | Attending: Emergency Medicine | Admitting: Emergency Medicine

## 2023-07-11 ENCOUNTER — Other Ambulatory Visit: Payer: Self-pay

## 2023-07-11 DIAGNOSIS — R1013 Epigastric pain: Secondary | ICD-10-CM | POA: Diagnosis not present

## 2023-07-11 DIAGNOSIS — Z79899 Other long term (current) drug therapy: Secondary | ICD-10-CM | POA: Insufficient documentation

## 2023-07-11 DIAGNOSIS — R079 Chest pain, unspecified: Secondary | ICD-10-CM | POA: Diagnosis not present

## 2023-07-11 DIAGNOSIS — I10 Essential (primary) hypertension: Secondary | ICD-10-CM | POA: Insufficient documentation

## 2023-07-11 DIAGNOSIS — R0789 Other chest pain: Secondary | ICD-10-CM | POA: Insufficient documentation

## 2023-07-11 DIAGNOSIS — I2510 Atherosclerotic heart disease of native coronary artery without angina pectoris: Secondary | ICD-10-CM | POA: Diagnosis not present

## 2023-07-11 LAB — CBC
HCT: 38.7 % — ABNORMAL LOW (ref 39.0–52.0)
Hemoglobin: 13.3 g/dL (ref 13.0–17.0)
MCH: 33 pg (ref 26.0–34.0)
MCHC: 34.4 g/dL (ref 30.0–36.0)
MCV: 96 fL (ref 80.0–100.0)
Platelets: 270 10*3/uL (ref 150–400)
RBC: 4.03 MIL/uL — ABNORMAL LOW (ref 4.22–5.81)
RDW: 13.9 % (ref 11.5–15.5)
WBC: 7.5 10*3/uL (ref 4.0–10.5)
nRBC: 0 % (ref 0.0–0.2)

## 2023-07-11 LAB — TROPONIN I (HIGH SENSITIVITY)
Troponin I (High Sensitivity): 4 ng/L (ref <18)
Troponin I (High Sensitivity): 3 ng/L (ref <18)

## 2023-07-11 LAB — BASIC METABOLIC PANEL WITH GFR
Anion gap: 9 (ref 5–15)
BUN: 18 mg/dL (ref 8–23)
CO2: 26 mmol/L (ref 22–32)
Calcium: 9.6 mg/dL (ref 8.9–10.3)
Chloride: 104 mmol/L (ref 98–111)
Creatinine, Ser: 1.02 mg/dL (ref 0.61–1.24)
GFR, Estimated: >60 mL/min (ref >60)
Glucose, Bld: 98 mg/dL (ref 70–99)
Potassium: 4.2 mmol/L (ref 3.5–5.1)
Sodium: 139 mmol/L (ref 135–145)

## 2023-07-11 LAB — HEPATIC FUNCTION PANEL
ALT: 18 U/L (ref 0–44)
AST: 23 U/L (ref 15–41)
Albumin: 3.8 g/dL (ref 3.5–5.0)
Alkaline Phosphatase: 59 U/L (ref 38–126)
Bilirubin, Direct: 0.1 mg/dL (ref 0.0–0.2)
Indirect Bilirubin: 0.5 mg/dL (ref 0.3–0.9)
Total Bilirubin: 0.6 mg/dL (ref 0.0–1.2)
Total Protein: 7.1 g/dL (ref 6.5–8.1)

## 2023-07-11 LAB — LIPASE, BLOOD: Lipase: 30 U/L (ref 11–51)

## 2023-07-11 MED ORDER — ALUM & MAG HYDROXIDE-SIMETH 200-200-20 MG/5ML PO SUSP
30.0000 mL | Freq: Once | ORAL | Status: AC
  Administered 2023-07-11: 30 mL via ORAL
  Filled 2023-07-11: qty 30

## 2023-07-11 NOTE — ED Provider Notes (Signed)
 [facility] AT [facility] Provider Note   CSN: [id number] Arrival date & time: 07/11/23  4696     History  Chief Complaint  Patient presents with   Chest Pain    David Vincent is a 73 y.o. male.  HPI     This is a 73 year old male who presents with chest pain.  Patient reports that he had 1 episode of chest pain on Sunday.  It was sharp and left-sided.  It radiated to his back.  He took Tums with relief.  Tonight he woke up went to the bathroom.  Upon returning to the bed he had recurrence of pain.  It is not exertional in nature.  He states that he has a remote history of cardiac catheterization but was told it was reflux.  He is not on any ongoing reflux medications.  He does have some upper abdominal pain.  No nausea or vomiting.  Reports history of high blood pressure.  Chart review reveals a stress test in 2013 which was normal.  I do not see any notes regarding cardiac catheterization.  Home Medications Prior to Admission medications   Medication Sig Start Date End Date Taking? Authorizing Provider  amLODipine (NORVASC) 5 MG tablet Take 1 tablet (5 mg total) by mouth daily. 02/06/23   Nafziger, Kandee Keen, NP  dicyclomine (BENTYL) 10 MG capsule Take 1 capsule (10 mg total) by mouth 4 (four) times daily -  before meals and at bedtime for 10 days. 06/22/22 06/26/23  Nafziger, Kandee Keen, NP  EPINEPHrine 0.3 mg/0.3 mL IJ SOAJ injection Inject 0.3 mg into the muscle as needed for anaphylaxis. 07/11/22   Nafziger, Kandee Keen, NP  losartan (COZAAR) 100 MG tablet Take 1 tablet by mouth once daily 07/09/23   Nafziger, Kandee Keen, NP  meclizine (ANTIVERT) 25 MG tablet TAKE 1 TABLET BY MOUTH THREE TIMES DAILY AS NEEDED FOR DIZZINESS 01/31/23   Nafziger, Kandee Keen, NP  omeprazole (PRILOSEC) 20 MG capsule Take 1 capsule by mouth once daily in the morning 04/24/23   Nafziger, Kandee Keen, NP  simvastatin (ZOCOR) 10 MG tablet Take 1 tablet (10 mg total) by mouth at bedtime. 02/06/23   Nafziger, Kandee Keen,  NP  tamsulosin [facility]) 0.4 MG CAPS capsule Take 1 capsule by mouth once daily 04/24/23   Nafziger, Kandee Keen, NP      Allergies    Bee venom, Gabapentin, Codeine phosphate, Lipitor [atorvastatin], and Penicillins    Review of Systems   Review of Systems  Respiratory:  Negative for shortness of breath.   Cardiovascular:  Positive for chest pain.  All other systems reviewed and are negative.   Physical Exam Updated Vital Signs BP (!) 143/64 (BP Location: Right Arm)   Pulse (!) 56   Temp 98.2 F (36.8 C) (Oral)   Resp 18   Ht 1.753 m (5\' 9" )   Wt 119.3 kg   SpO2 99%   BMI 38.84 kg/m  Physical Exam Vitals and nursing note reviewed.  Constitutional:      Appearance: He is well-developed. He is obese. He is not ill-appearing.  HENT:     Head: Normocephalic and atraumatic.  Eyes:     Pupils: Pupils are equal, round, and reactive to light.  Cardiovascular:     Rate and Rhythm: Normal rate and regular rhythm.     Heart sounds: Normal heart sounds. No murmur heard. Pulmonary:     Effort: Pulmonary effort is normal. No respiratory distress.     Breath sounds: Normal breath sounds.  No wheezing.  Abdominal:     General: Bowel sounds are normal.     Palpations: Abdomen is soft.     Tenderness: There is abdominal tenderness. There is no rebound.     Comments: Epigastric tenderness to palpation, no rebound or guarding  Musculoskeletal:     Cervical back: Neck supple.  Lymphadenopathy:     Cervical: No cervical adenopathy.  Skin:    General: Skin is warm and dry.  Neurological:     Mental Status: He is alert and oriented to person, place, and time.  Psychiatric:        Mood and Affect: Mood normal.     ED Results / Procedures / Treatments   Labs (all labs ordered are listed, but only abnormal results are displayed) Labs Reviewed  CBC - Abnormal; Notable for the following components:      Result Value   RBC 4.03 (*)    HCT 38.7 (*)    All other components within normal  limits  BASIC METABOLIC PANEL WITH GFR  LIPASE, BLOOD  HEPATIC FUNCTION PANEL  TROPONIN I (HIGH SENSITIVITY)  TROPONIN I (HIGH SENSITIVITY)    EKG EKG Interpretation Date/Time:  Thursday July 11 2023 05:36:17 EDT Ventricular Rate:  63 PR Interval:  147 QRS Duration:  100 QT Interval:  409 QTC Calculation: 419 R Axis:   71  Text Interpretation: Sinus rhythm Low voltage, precordial leads Confirmed by Ross Marcus ([id number]) on 07/11/2023 6:19:43 AM  Radiology DG Chest 2 View Result Date: 07/11/2023 CLINICAL DATA:  Chest pain EXAM: CHEST - 2 VIEW COMPARISON:  Vincent Available. FINDINGS: The heart size and mediastinal contours are within normal limits. Both lungs are clear. The visualized skeletal structures are unremarkable. IMPRESSION: No active cardiopulmonary disease. Electronically Signed   By: Helyn Numbers M.D.   On: 07/11/2023 03:34    Procedures Procedures    Medications Ordered in ED Medications  alum & mag hydroxide-simeth (MAALOX/MYLANTA) 200-200-20 MG/5ML suspension 30 mL (30 mLs Oral Given 07/11/23 0501)    ED Course/ Medical Decision Making/ A&P Clinical Course as of 07/11/23 0650  Thu Jul 11, 2023  0636 Patient improved with a GI cocktail.  Heart testing has been reassuring.  Referral for cardiology for repeat evaluation given it has been sometime since he has had any ischemic workup.  Would favor GI etiology. [CH]    Clinical Course User Index [CH] Magdalynn Davilla, Mayer Masker, MD                                 Medical Decision Making Amount and/or Complexity of Data Reviewed Labs: ordered. Radiology: ordered.  Risk OTC drugs.   This patient presents to the ED for concern of chest pain, this involves an extensive number of treatment options, and is a complaint that carries with it a high risk of complications and morbidity.  I considered the following differential and admission for this acute, potentially life threatening condition.  The differential diagnosis  includes ACS, PE, pneumothorax, pneumonia, reflux, pancreatitis  MDM:    This is a 73 year old male who presents with chest pain.  Chest pain is very atypical in nature.  Vital signs notable for blood pressure 143/64.  EKG shows no evidence of acute ischemia or arrhythmia.  Troponin x 2 negative.  Chest x-ray without pneumothorax or pneumonia.  Doubt PE.  LFTs and lipase are normal.  Some features of his pain are suggestive of GI  etiology.  Patient was given a GI cocktail.  Did have some improvement with this.  He has not had an ischemic workup in over 10 years based on my chart review.  Recommend follow-up cardiology follow-up.  Will increase his home omeprazole to 40 mg daily.  (Labs, imaging, consults)  Labs: I Ordered, and personally interpreted labs.  The pertinent results include: CBC, CMP, lipase, troponin x 2  Imaging Studies ordered: I ordered imaging studies including chest x-ray I independently visualized and interpreted imaging. I agree with the radiologist interpretation  Additional history obtained from chart review.  External records from outside source obtained and reviewed including prior evaluations  Cardiac Monitoring: The patient was maintained on a cardiac monitor.  If on the cardiac monitor, I personally viewed and interpreted the cardiac monitored which showed an underlying rhythm of: Sinus  Reevaluation: After the interventions noted above, I reevaluated the patient and found that they have :improved  Social Determinants of Health:  lives independently  Disposition: Discharge  Co morbidities that complicate the patient evaluation  Past Medical History:  Diagnosis Date   BACK PAIN 03/10/2007   CAD (coronary artery disease)    A.  01/05/2002 Cath - nonobs dzs (LAD 20prox, RI 30-40 prox)   ED (erectile dysfunction)    GERD (gastroesophageal reflux disease)    History of mumps orchitis    Hyperlipidemia    HYPERTENSION 11/15/2006   INGUINAL HERNIA, RIGHT  12/05/2009   LEG CRAMPS 12/03/2008   SLEEP APNEA 11/16/2008   Sleep apnea    no cpap    Unstable angina (HCC)    VERTIGO, POSITIONAL 08/08/2009     Medicines Meds ordered this encounter  Medications   alum & mag hydroxide-simeth (MAALOX/MYLANTA) 200-200-20 MG/5ML suspension 30 mL    I have reviewed the patients home medicines and have made adjustments as needed  Problem List / ED Course: Problem List Items Addressed This Visit   Vincent Visit Diagnoses       Atypical chest pain    -  Primary   Relevant Orders   Ambulatory referral to Cardiology                   Final Clinical Impression(s) / ED Diagnoses Final diagnoses:  Atypical chest pain    Rx / DC Orders ED Discharge Orders          Ordered    Ambulatory referral to Cardiology        07/11/23 0644              Shon Baton, MD 07/11/23 [phone]

## 2023-07-11 NOTE — ED Triage Notes (Signed)
 Patient reports central chest pain onset Sunday last week with mild SOB , no emesis or diaphoresis , pain radiating to left arm .

## 2023-07-11 NOTE — ED Notes (Signed)
 Patient up walking through hallways with 1 person side by side. Gait steady. Patient denies any dizziness or lightheadedness.

## 2023-07-11 NOTE — Discharge Instructions (Addendum)
 You were seen today for chest pain.  Your workup today is reassuring.  Increase your Prilosec to 40 mg daily.  Follow-up with cardiology for definitive testing.  It appears that you have not had a stress test in over 10 years.

## 2023-07-17 DIAGNOSIS — N401 Enlarged prostate with lower urinary tract symptoms: Secondary | ICD-10-CM | POA: Diagnosis not present

## 2023-07-17 DIAGNOSIS — N3000 Acute cystitis without hematuria: Secondary | ICD-10-CM | POA: Diagnosis not present

## 2023-07-17 DIAGNOSIS — R351 Nocturia: Secondary | ICD-10-CM | POA: Diagnosis not present

## 2023-07-18 ENCOUNTER — Ambulatory Visit (INDEPENDENT_AMBULATORY_CARE_PROVIDER_SITE_OTHER): Admitting: Adult Health

## 2023-07-18 ENCOUNTER — Encounter: Payer: Self-pay | Admitting: Adult Health

## 2023-07-18 VITALS — BP 110/70 | HR 93 | Temp 97.8°F | Ht 69.0 in | Wt 269.0 lb

## 2023-07-18 DIAGNOSIS — K219 Gastro-esophageal reflux disease without esophagitis: Secondary | ICD-10-CM

## 2023-07-18 DIAGNOSIS — R079 Chest pain, unspecified: Secondary | ICD-10-CM | POA: Diagnosis not present

## 2023-07-18 MED ORDER — OMEPRAZOLE 40 MG PO CPDR: 40.0000 mg | DELAYED_RELEASE_CAPSULE | Freq: Every day | ORAL | 3 refills | Status: AC

## 2023-07-18 NOTE — Progress Notes (Signed)
 Subjective:    Patient ID: David Vincent, male    DOB: 08/28/1950, 73 y.o.   MRN: [id number]  HPI 73 year old male who  has a past medical history of BACK PAIN (03/10/2007), CAD (coronary artery disease), ED (erectile dysfunction), GERD (gastroesophageal reflux disease), History of mumps orchitis, Hyperlipidemia, HYPERTENSION (11/15/2006), INGUINAL HERNIA, RIGHT (12/05/2009), LEG CRAMPS (12/03/2008), SLEEP APNEA (11/16/2008), Sleep apnea, Unstable angina (HCC), and VERTIGO, POSITIONAL (08/08/2009).  He presents to the office today for follow up after being seen in the ER   He presented to the emergency room 7 days ago with chest pain.  At this time he reported that he had 1 episode of chest pain 4 days prior.  The pain was sharp and left-sided radiated to his back.  At home he took Tums with relief.  On the day that he went to the emergency room he woke up to go to the bathroom and upon returning to bed he had a reoccurrence of the pain.  Pain was not exertional in nature.  He did have some upper abdominal pain but no nausea or vomiting.  His EKG showed no evidence of acute ischemia or arrhythmia.  His troponin was negative x 2.  Chest x-ray did not show pneumothorax or pneumonia.  LFTs and lipase were normal.  He was given a GI cocktail and improved with this.  His omeprazole was increased to 40 mg daily and he was advised to follow-up with cardiology outpatient ( referral placed in the ER).   Today he reports that he has not had any further chest pain. He is feeling well overall.  He has no complaints.   Review of Systems See HPI   Past Medical History:  Diagnosis Date   BACK PAIN 03/10/2007   CAD (coronary artery disease)    A.  01/05/2002 Cath - nonobs dzs (LAD 20prox, RI 30-40 prox)   ED (erectile dysfunction)    GERD (gastroesophageal reflux disease)    History of mumps orchitis    Hyperlipidemia    HYPERTENSION 11/15/2006   INGUINAL HERNIA, RIGHT 12/05/2009   LEG CRAMPS 12/03/2008   SLEEP  APNEA 11/16/2008   Sleep apnea    no cpap    Unstable angina (HCC)    VERTIGO, POSITIONAL 08/08/2009    Social History   Socioeconomic History   Marital status: Married    Spouse name: Not on file   Number of children: Not on file   Years of education: Not on file   Highest education level: Not on file  Occupational History   Not on file  Tobacco Use   Smoking status: Never   Smokeless tobacco: Never  Vaping Use   Vaping status: Never Used  Substance and Sexual Activity   Alcohol use: No   Drug use: No   Sexual activity: Not on file  Other Topics Concern   Not on file  Social History Narrative   05/02/2011:  Currently unemployed.  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  Does not exercise or adhere to any specific diet.   Social Drivers of Corporate investment banker Strain: Low Risk  (01/31/2023)   Overall Financial Resource Strain (CARDIA)    Difficulty of Paying Living Expenses: Not hard at all  Food Insecurity: No Food Insecurity (01/31/2023)   Hunger Vital Sign    Worried About Running Out of Food in the Last Year: Never true    Ran Out of  Food in the Last Year: Never true  Transportation Needs: No Transportation Needs (01/31/2023)   PRAPARE - Administrator, Civil Service (Medical): No    Lack of Transportation (Non-Medical): No  Physical Activity: Sufficiently Active (01/31/2023)   Exercise Vital Sign    Days of Exercise per Week: 7 days    Minutes of Exercise per Session: 30 min  Stress: No Stress Concern Present (01/31/2023)   [employer] of Occupational Health - Occupational Stress Questionnaire    Feeling of Stress : Not at all  Social Connections: Socially Isolated (01/31/2023)   Social Connection and Isolation Panel [NHANES]    Frequency of Communication with Friends and Family: Once a week    Frequency of Social Gatherings with Friends and Family: Never    Attends Religious Services: Never    Database administrator  or Organizations: No    Attends Banker Meetings: Never    Marital Status: Married  Catering manager Violence: Not At Risk (01/31/2023)   Humiliation, Afraid, Rape, and Kick questionnaire    Fear of Current or Ex-Partner: No    Emotionally Abused: No    Physically Abused: No    Sexually Abused: No    Past Surgical History:  Procedure Laterality Date   CARDIAC CATHETERIZATION     COLONOSCOPY  2011   F/V-tics/normal- 10 yr recall   FOOT SURGERY Right 2019   KNEE ARTHROSCOPY Right    SHOULDER SURGERY Bilateral     Family History  Problem Relation Age of Onset   Pneumonia Mother        died 30 - hip fx/pna   Heart attack Father        died 53   Hypertension Sister        alive   Cancer Brother        died - agent orange exposure   Hypertension Brother        alive   Colon cancer Neg Hx    Colon polyps Neg Hx    Esophageal cancer Neg Hx    Rectal cancer Neg Hx    Stomach cancer Neg Hx     Allergies  Allergen Reactions   Bee Venom Anaphylaxis   Gabapentin Other (See Comments)    Hallucinations    Codeine Phosphate Itching   Lipitor [Atorvastatin] Other (See Comments)    Muscle cramps    Penicillins Other (See Comments)    Has patient had a PCN reaction causing immediate rash, facial/tongue/throat swelling, SOB or lightheadedness with hypotension: Unknown, childhood reaction Has patient had a PCN reaction causing severe rash involving mucus membranes or skin necrosis: No Has patient had a PCN reaction that required hospitalization No Has patient had a PCN reaction occurring within the last 10 years: No If all of the above answers are "NO", then may proceed with Cephalosporin use.     Current Outpatient Medications on File Prior to Visit  Medication Sig Dispense Refill   amLODipine (NORVASC) 5 MG tablet Take 1 tablet (5 mg total) by mouth daily. 90 tablet 3   EPINEPHrine 0.3 mg/0.3 mL IJ SOAJ injection Inject 0.3 mg into the muscle as needed for  anaphylaxis. 1 each 5   losartan (COZAAR) 100 MG tablet Take 1 tablet by mouth once daily 90 tablet 0   meclizine (ANTIVERT) 25 MG tablet TAKE 1 TABLET BY MOUTH THREE TIMES DAILY AS NEEDED FOR DIZZINESS 30 tablet 0   omeprazole (PRILOSEC) 20 MG capsule Take 1 capsule  by mouth once daily in the morning 90 capsule 0   simvastatin (ZOCOR) 10 MG tablet Take 1 tablet (10 mg total) by mouth at bedtime. 90 tablet 3   tamsulosin (FLOMAX) 0.4 MG CAPS capsule Take 1 capsule by mouth once daily 90 capsule 0   dicyclomine (BENTYL) 10 MG capsule Take 1 capsule (10 mg total) by mouth 4 (four) times daily -  before meals and at bedtime for 10 days. 40 capsule 0   No current facility-administered medications on file prior to visit.    BP 110/70   Pulse 93   Temp 97.8 F (36.6 C) (Oral)   Ht 5\' 9"  (1.753 m)   Wt 269 lb (122 kg)   SpO2 97%   BMI 39.72 kg/m       Objective:   Physical Exam Vitals and nursing note reviewed.  Constitutional:      Appearance: Normal appearance.  Cardiovascular:     Rate and Rhythm: Normal rate and regular rhythm.     Pulses: Normal pulses.     Heart sounds: Normal heart sounds.  Pulmonary:     Effort: Pulmonary effort is normal.     Breath sounds: Normal breath sounds.  Musculoskeletal:        General: Normal range of motion.  Skin:    General: Skin is warm and dry.  Neurological:     General: No focal deficit present.     Mental Status: He is alert and oriented to person, place, and time.  Psychiatric:        Mood and Affect: Mood normal.        Behavior: Behavior normal.        Thought Content: Thought content normal.        Judgment: Judgment normal.        Assessment & Plan:   1. Chest pain, unspecified type (Primary) - Resolved. Likely GERD related - Referral for Cardiology placed in ER, they will call him - Follow up with any additional chest pain  2. Gastroesophageal reflux disease without esophagitis - Will send in a new prescription for  Prilosec 40 mg  - omeprazole (PRILOSEC) 40 MG capsule; Take 1 capsule (40 mg total) by mouth daily.  Dispense: 90 capsule; Refill: 3   [name], NP

## 2023-10-02 DIAGNOSIS — R351 Nocturia: Secondary | ICD-10-CM | POA: Diagnosis not present

## 2023-10-02 DIAGNOSIS — N401 Enlarged prostate with lower urinary tract symptoms: Secondary | ICD-10-CM | POA: Diagnosis not present

## 2023-10-06 ENCOUNTER — Other Ambulatory Visit: Payer: Self-pay | Admitting: Adult Health

## 2024-01-02 ENCOUNTER — Other Ambulatory Visit: Payer: Self-pay | Admitting: Adult Health

## 2024-01-07 ENCOUNTER — Encounter: Payer: Self-pay | Admitting: Cardiology

## 2024-01-07 ENCOUNTER — Ambulatory Visit: Attending: Cardiology | Admitting: Cardiology

## 2024-01-07 VITALS — BP 120/73 | HR 63 | Ht 68.0 in | Wt 267.6 lb

## 2024-01-07 DIAGNOSIS — R072 Precordial pain: Secondary | ICD-10-CM | POA: Insufficient documentation

## 2024-01-07 DIAGNOSIS — R0789 Other chest pain: Secondary | ICD-10-CM | POA: Insufficient documentation

## 2024-01-07 DIAGNOSIS — R011 Cardiac murmur, unspecified: Secondary | ICD-10-CM | POA: Diagnosis not present

## 2024-01-07 DIAGNOSIS — R600 Localized edema: Secondary | ICD-10-CM | POA: Insufficient documentation

## 2024-01-07 NOTE — Progress Notes (Signed)
  Cardiology Office Note:  .   Date:  01/07/2024  ID:  David Vincent, DOB 10-09-1950, MRN [id number] PCP: Merna Huxley, NP  [facility] Providers Cardiologist:  Newman Lawrence, MD PCP: Merna Huxley, NP  Chief Complaint  Patient presents with   Chest Pain     David Vincent is a 73 y.o. male with hypertension, controlled hyperlipidemia, chest pain  Discussed the use of AI scribe software for clinical note transcription with the patient, who gave verbal consent to proceed.  History of Present Illness David Vincent is a 73 year old male with hypertension and hyperlipidemia who presents with chest pain.  He experiences intermittent chest pain primarily at rest, lasting only a few seconds and resolving spontaneously. There is no chest pain during physical activities. Shortness of breath is particularly noticeable at the time of the visit. He acknowledges recent leg swelling. He consumes salt with every meal. His family history is significant for heart disease, with both parents having had heart problems, and his father died of a heart attack. He denies smoking or drinking alcohol.      Vitals:   01/07/24 1325  BP: 120/73  Pulse: 63  SpO2: 98%      Review of Systems  Cardiovascular:  Positive for chest pain. Negative for dyspnea on exertion, leg swelling, palpitations and syncope.        Studies Reviewed: David Vincent        EKG 01/07/2024: Sinus rhythm with Premature atrial complexes When compared with ECG of 11-Jul-2023 05:36,  PAC's new    Labs 01/2023-07/2023: Chol 121, TG 152, HDL 38, LDL 52 Hb 13.3 Cr 8.97 Trop HS 4, 3 TSH 0.94   Physical Exam Vitals and nursing note reviewed.  Constitutional:      General: He is not in acute distress. Neck:     Vascular: No JVD.  Cardiovascular:     Rate and Rhythm: Normal rate and regular rhythm.     Heart sounds: Murmur heard.     Harsh midsystolic murmur is present with a grade of 2/6 at the upper right  sternal border radiating to the neck.  Pulmonary:     Effort: Pulmonary effort is normal.     Breath sounds: Normal breath sounds. No wheezing or rales.  Musculoskeletal:     Right lower leg: No edema.     Left lower leg: No edema.      VISIT DIAGNOSES:   ICD-10-CM   1. Precordial pain  R07.2 EKG 12-Lead    CT CARDIAC SCORING (SELF PAY ONLY)    2. Murmur  R01.1 ECHOCARDIOGRAM COMPLETE    3. Leg edema  R60.0 Pro b natriuretic peptide (BNP)       David Vincent is a 73 y.o. male with hypertension, controlled hyperlipidemia, chest pain Assessment & Plan Lower extremity edema and shortness of breath: New onset edema and dyspnea, possibly due to high salt intake and amlodipine . Differential includes heart failure. - Reduce salt intake. - Check echocardiogram. - Order ProBNP for heart failure assessment.  Chest pain: Intermittent rest chest pain, likely non-cardiac, possibly muscle spasm. - Order calcium  score scan for coronary calcification. - Consider stress test if significant calcification present.     F/u in 3 months wAPP  Signed, Newman JINNY Lawrence, MD

## 2024-01-07 NOTE — Patient Instructions (Signed)
  Lab Work: PROBNP  If you have labs (blood work) drawn today and your tests are completely normal, you will receive your results only by: MyChart Message (if you have MyChart) OR A paper copy in the mail If you have any lab test that is abnormal or we need to change your treatment, we will call you to review the results.  Testing/Procedures: ECHOCARDIOGRAM  Your physician has requested that you have an echocardiogram. Echocardiography is a painless test that uses sound waves to create images of your heart. It provides your doctor with information about the size and shape of your heart and how well your heart's chambers and valves are working. This procedure takes approximately one hour. There are no restrictions for this procedure. Please do NOT wear cologne, perfume, aftershave, or lotions (deodorant is allowed). Please arrive 15 minutes prior to your appointment time.  Please note: We ask at that you not bring children with you during ultrasound (echo/ vascular) testing. Due to room size and safety concerns, children are not allowed in the ultrasound rooms during exams. Our front office staff cannot provide observation of children in our lobby area while testing is being conducted. An adult accompanying a patient to their appointment will only be allowed in the ultrasound room at the discretion of the ultrasound technician under special circumstances. We apologize for any inconvenience.   CALCIUM  SCORE   CT scanning for a cardiac calcium  score (CAT scanning), is a noninvasive, special x-ray that produces cross-sectional images of the body using x-rays and a computer. CT scans help physicians diagnose and treat medical conditions. For some CT exams, a contrast material is used to enhance visibility in the area of the body being studied. CT scans provide greater clarity and reveal more details than regular x-ray exams.  Your physician has requested that you have a coronary calcium  score  performed. This is not covered by insurance and will be an out-of-pocket cost of approximately $99.   Follow-Up: At [facility], you and your health needs are our priority.  As part of our continuing mission to provide you with exceptional heart care, our providers are all part of one team.  This team includes your primary Cardiologist (physician) and Advanced Practice Providers or APPs (Physician Assistants and Nurse Practitioners) who all work together to provide you with the care you need, when you need it.  Your next appointment:   3 month(s)  Provider:   One of our Advanced Practice Providers (APPs): Morse Clause, PA-C  Lamarr Satterfield, NP Miriam Shams, NP  Olivia Pavy, PA-C Josefa Beauvais, NP  Leontine Salen, PA-C Orren Fabry, PA-C  Gratis, PA-C Ernest Dick, NP  Damien Braver, NP Jon Hails, PA-C  Waddell Donath, PA-C    Dayna Dunn, PA-C  Scott Weaver, PA-C Lum Louis, NP Katlyn West, NP Callie Goodrich, PA-C  Xika Zhao, NP Sheng Haley, PA-C    Kathleen Johnson, PA-C

## 2024-01-08 ENCOUNTER — Ambulatory Visit: Payer: Self-pay

## 2024-01-08 NOTE — Telephone Encounter (Signed)
 FYI Only or Action Required?: FYI only for provider.  Patient was last seen in primary care on 07/18/2023 by Merna Huxley, NP.  Called Nurse Triage reporting No chief complaint on file..  Symptoms began several weeks ago.  Interventions attempted: Rest, hydration, or home remedies.  Symptoms are: gradually worsening.  Triage Disposition: See PCP Within 2 Weeks  Patient/caregiver understands and will follow disposition?: Yes  Copied from CRM [id number]. Topic: Clinical - Red Word Triage >> Jan 08, 2024  8:31 AM Carlyon D wrote: Red Word that prompted transfer to Nurse Triage: Pain in left hand, Left hand ring finger has pain as well pt is not sure what's going on but he states he can't even keep his ring on his finger with the pain being so bad. Reason for Disposition  [1] MILD pain (e.g., does not interfere with normal activities) AND [2] present > 7 days  Answer Assessment - Initial Assessment Questions 1. ONSET: When did the pain start?     A couple of weeks  2. LOCATION: Where is the pain located?     Left Ring Finger  3. PAIN: How bad is the pain? (Scale 1-10; or mild, moderate, severe)     Moderate   4. WORK OR EXERCISE: Has there been any recent work or exercise that involved this part (i.e., hand or wrist) of the body?     Denies  5. CAUSE: What do you think is causing the pain?     Unsure  6. AGGRAVATING FACTORS: What makes the pain worse? (e.g., using computer)     Making a fist, or squeezing   7. OTHER SYMPTOMS: Do you have any other symptoms? (e.g., fever, neck pain, numbness or tingling, rash, swelling)     Denies any other symptoms  Protocols used: Hand Pain-A-AH

## 2024-01-09 ENCOUNTER — Ambulatory Visit (INDEPENDENT_AMBULATORY_CARE_PROVIDER_SITE_OTHER): Admitting: Adult Health

## 2024-01-09 VITALS — BP 120/70 | HR 90 | Temp 97.7°F | Ht 68.0 in | Wt 266.0 lb

## 2024-01-09 DIAGNOSIS — M65341 Trigger finger, right ring finger: Secondary | ICD-10-CM

## 2024-01-09 DIAGNOSIS — M79645 Pain in left finger(s): Secondary | ICD-10-CM

## 2024-01-09 NOTE — Progress Notes (Signed)
 Subjective:    Patient ID: David Vincent, male    DOB: 1950/05/02, 73 y.o.   MRN: [id number]  Hand Pain       Review of Systems See HPI   Past Medical History:  Diagnosis Date   BACK PAIN 03/10/2007   CAD (coronary artery disease)    A.  01/05/2002 Cath - nonobs dzs (LAD 20prox, RI 30-40 prox)   ED (erectile dysfunction)    GERD (gastroesophageal reflux disease)    History of mumps orchitis    Hyperlipidemia    HYPERTENSION 11/15/2006   INGUINAL HERNIA, RIGHT 12/05/2009   LEG CRAMPS 12/03/2008   SLEEP APNEA 11/16/2008   Sleep apnea    no cpap    Unstable angina (HCC)    VERTIGO, POSITIONAL 08/08/2009    Social History   Socioeconomic History   Marital status: Married    Spouse name: Not on file   Number of children: Not on file   Years of education: Not on file   Highest education level: 12th grade  Occupational History   Not on file  Tobacco Use   Smoking status: Never   Smokeless tobacco: Never  Vaping Use   Vaping status: Never Used  Substance and Sexual Activity   Alcohol use: No   Drug use: No   Sexual activity: Not on file  Other Topics Concern   Not on file  Social History Narrative   05/02/2011:  Currently unemployed.  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  Does not exercise or adhere to any specific diet.   Social Drivers of Corporate investment banker Strain: Low Risk  (01/09/2024)   Overall Financial Resource Strain (CARDIA)    Difficulty of Paying Living Expenses: Not hard at all  Food Insecurity: No Food Insecurity (01/09/2024)   Hunger Vital Sign    Worried About Running Out of Food in the Last Year: Never true    Ran Out of Food in the Last Year: Never true  Transportation Needs: No Transportation Needs (01/09/2024)   PRAPARE - Administrator, Civil Service (Medical): No    Lack of Transportation (Non-Medical): No  Physical Activity: Inactive (01/09/2024)   Exercise Vital Sign    Days of Exercise per Week: 0  days    Minutes of Exercise per Session: Not on file  Stress: No Stress Concern Present (01/09/2024)   [employer] of Occupational Health - Occupational Stress Questionnaire    Feeling of Stress: Not at all  Social Connections: Socially Isolated (01/09/2024)   Social Connection and Isolation Panel    Frequency of Communication with Friends and Family: Once a week    Frequency of Social Gatherings with Friends and Family: Never    Attends Religious Services: Never    Database administrator or Organizations: No    Attends Engineer, structural: Not on file    Marital Status: Married  Catering manager Violence: Not At Risk (01/31/2023)   Humiliation, Afraid, Rape, and Kick questionnaire    Fear of Current or Ex-Partner: No    Emotionally Abused: No    Physically Abused: No    Sexually Abused: No    Past Surgical History:  Procedure Laterality Date   CARDIAC CATHETERIZATION     COLONOSCOPY  2011   F/V-tics/normal- 10 yr recall   FOOT SURGERY Right 2019   KNEE ARTHROSCOPY Right    SHOULDER SURGERY Bilateral     Family  History  Problem Relation Age of Onset   Pneumonia Mother        died 50 - hip fx/pna   Heart attack Father        died 82   Hypertension Sister        alive   Cancer Brother        died - agent orange exposure   Hypertension Brother        alive   Colon cancer Neg Hx    Colon polyps Neg Hx    Esophageal cancer Neg Hx    Rectal cancer Neg Hx    Stomach cancer Neg Hx     Allergies  Allergen Reactions   Bee Venom Anaphylaxis   Gabapentin  Other (See Comments)    Hallucinations    Codeine Phosphate Itching   Lipitor [Atorvastatin ] Other (See Comments)    Muscle cramps    Penicillins Other (See Comments)    Has patient had a PCN reaction causing immediate rash, facial/tongue/throat swelling, SOB or lightheadedness with hypotension: Unknown, childhood reaction Has patient had a PCN reaction causing severe rash involving mucus membranes or  skin necrosis: No Has patient had a PCN reaction that required hospitalization No Has patient had a PCN reaction occurring within the last 10 years: No If all of the above answers are NO, then may proceed with Cephalosporin use.     Current Outpatient Medications on File Prior to Visit  Medication Sig Dispense Refill   amLODipine  (NORVASC ) 5 MG tablet Take 1 tablet (5 mg total) by mouth daily. 90 tablet 3   EPINEPHrine  0.3 mg/0.3 mL IJ SOAJ injection Inject 0.3 mg into the muscle as needed for anaphylaxis. 1 each 5   losartan  (COZAAR ) 100 MG tablet Take 1 tablet by mouth once daily 90 tablet 0   meclizine  (ANTIVERT ) 25 MG tablet TAKE 1 TABLET BY MOUTH THREE TIMES DAILY AS NEEDED FOR DIZZINESS 30 tablet 0   omeprazole  (PRILOSEC ) 40 MG capsule Take 1 capsule (40 mg total) by mouth daily. 90 capsule 3   simvastatin  (ZOCOR ) 10 MG tablet Take 1 tablet (10 mg total) by mouth at bedtime. 90 tablet 3   tamsulosin  (FLOMAX ) 0.4 MG CAPS capsule Take 1 capsule by mouth once daily 90 capsule 0   No current facility-administered medications on file prior to visit.    BP 120/70   Pulse 90   Temp 97.7 F (36.5 C) (Oral)   Ht 5' 8 (1.727 m)   Wt 266 lb (120.7 kg)   SpO2 98%   BMI 40.45 kg/m       Objective:   Physical Exam Vitals and nursing note reviewed.  Constitutional:      Appearance: Normal appearance.  Cardiovascular:     Pulses: Normal pulses.  Musculoskeletal:        General: Normal range of motion.     Right hand: No swelling, deformity, tenderness or bony tenderness. Normal range of motion. Normal strength. Normal sensation.     Left hand: Bony tenderness present. No swelling. Normal range of motion. Normal sensation.     Comments: No signs of trigger finger to the right ring finger.   He does have what appears to be a bony growth felt on the palmer surface of the MCP joint of the left wring finger   Skin:    General: Skin is warm and dry.     Capillary Refill: Capillary  refill takes less than 2 seconds.  Neurological:  General: No focal deficit present.     Mental Status: He is alert and oriented to person, place, and time.  Psychiatric:        Mood and Affect: Mood normal.        Behavior: Behavior normal.        Thought Content: Thought content normal.        Judgment: Judgment normal.        Assessment & Plan:  1. Trigger ring finger of right hand (Primary) - No longer experiencing. No indication for steroid injection. If symptoms come back then he knows to follow up   2. Pain in finger of left hand ? Bony growth or cyst. Will get xray and likely need to refer to hand surgery  - DG Hand Complete Left; Future  Darleene Shape, NP  I personally spent a total of 30 minutes in the care of the patient today including preparing to see the patient, getting/reviewing separately obtained history, performing a medically appropriate exam/evaluation, counseling and educating, and documenting clinical information in the EHR.

## 2024-01-10 ENCOUNTER — Other Ambulatory Visit

## 2024-01-10 ENCOUNTER — Ambulatory Visit

## 2024-01-10 DIAGNOSIS — M79645 Pain in left finger(s): Secondary | ICD-10-CM

## 2024-01-10 DIAGNOSIS — M720 Palmar fascial fibromatosis [Dupuytren]: Secondary | ICD-10-CM | POA: Diagnosis not present

## 2024-01-16 ENCOUNTER — Ambulatory Visit: Payer: Self-pay | Admitting: Adult Health

## 2024-01-16 DIAGNOSIS — M79645 Pain in left finger(s): Secondary | ICD-10-CM

## 2024-01-19 ENCOUNTER — Other Ambulatory Visit: Payer: Self-pay | Admitting: Adult Health

## 2024-01-19 DIAGNOSIS — I10 Essential (primary) hypertension: Secondary | ICD-10-CM

## 2024-02-06 ENCOUNTER — Ambulatory Visit: Admitting: Family Medicine

## 2024-02-06 DIAGNOSIS — Z0000 Encounter for general adult medical examination without abnormal findings: Secondary | ICD-10-CM | POA: Diagnosis not present

## 2024-02-06 NOTE — Progress Notes (Signed)
 PATIENT CHECK-IN and HEALTH RISK ASSESSMENT QUESTIONNAIRE:  -completed by phone/video for upcoming Medicare Preventive Visit  Pre-Visit Check-in: 1)Vitals (height, wt, BP, etc) - record in vitals section for visit on day of visit Request home vitals (wt, BP, etc.) and enter into vitals, THEN update Vital Signs SmartPhrase below at the top of the HPI. See below.  2)Review and Update Medications, Allergies PMH, Surgeries, Social history in Epic 3)Hospitalizations in the last year with date/reason? n  4)Review and Update Care Team (patient's specialists) in Epic 5) Complete PHQ9 in Epic  6) Complete Fall Screening in Epic 7)Review all Health Maintenance Due and order if not done.  Medicare Wellness Patient Questionnaire:  Answer theses question about your habits: How often do you have a drink containing alcohol?never How many drinks containing alcohol do you have on a typical day when you are drinking?na How often do you have six or more drinks on one occasion?na Have you ever smoked?n Quit date if applicable? na  How many packs a day do/did you smoke? na Do you use smokeless tobacco?n Do you use an illicit drugs?n On average, how many days per week do you engage in moderate to strenuous exercise (like a brisk walk)?none - but does a lot of walking a work, also does a lot yard work Typical breakfast: instant breakfast - carnation Typical lunch: banana sandwich, white-wheat Typical dinner: usually carbs, meats Typical snacks: candy  Beverages: sweet tea, water  Answer theses question about your everyday activities: Can you perform most household chores?y Are you deaf or have significant trouble hearing?n Do you feel that you have a problem with memory?n Do you feel safe at home?y Last dentist visit? Went recently, going back next week 8. Do you have any difficulty performing your everyday activities?n Are you having any difficulty walking, taking medications on your own, and or  difficulty managing daily home needs?n Do you have difficulty walking or climbing stairs?n Do you have difficulty dressing or bathing?n Do you have difficulty doing errands alone such as visiting a doctor's office or shopping?n Do you currently have any difficulty preparing food and eating?n Do you currently have any difficulty using the toilet?n Do you have any difficulty managing your finances?n Do you have any difficulties with housekeeping of managing your housekeeping?n   Do you have Advanced Directives in place (Living Will, Healthcare Power or Attorney)? N   Last eye Exam and location?My eye doctor, 1 month ago   Do you currently use prescribed or non-prescribed narcotic or opioid pain medications?n  Do you have a history or close family history of breast, ovarian, tubal or peritoneal cancer or a family member with BRCA (breast cancer susceptibility 1 and 2) gene mutations? See pmh/fh    ----------------------------------------------------------------------------------------------------------------------------------------------------------------------------------------------------------------------  Because this visit was a virtual/telehealth visit, some criteria may be missing or patient reported. Any vitals not documented were not able to be obtained and vitals that have been documented are patient reported.    MEDICARE ANNUAL PREVENTIVE CARE VISIT WITH PROVIDER (Welcome to Medicare, initial annual wellness or annual wellness exam)  Virtual Visit via Video Note  I connected with David Vincent on 02/06/24  by a video enabled telemedicine application and verified that I am speaking with the correct person using two identifiers.  Location patient: home Location provider:work or home office Persons participating in the virtual visit: patient, provider  Concerns and/or follow up today: stable, feeling good   See HM section in Epic for other details of completed HM.  ROS: negative for report of fevers, unintentional weight loss, vision changes, vision loss, hearing loss or change, chest pain, sob, hemoptysis, melena, hematochezia, hematuria, falls, bleeding or bruising, thoughts of suicide or self harm, memory loss  Patient-completed extensive health risk assessment - reviewed and discussed with the patient: See Health Risk Assessment completed with patient prior to the visit either above or in recent phone note. This was reviewed in detailed with the patient today and appropriate recommendations, orders and referrals were placed as needed per Summary below and patient instructions.   Review of Medical History: -PMH, PSH, Family History and current specialty and care providers reviewed and updated and listed below   Patient Care Team: Merna Huxley, NP as PCP - General (Family Medicine) Elmira Newman PARAS, MD as PCP - Cardiology (Cardiology)   Past Medical History:  Diagnosis Date   BACK PAIN 03/10/2007   CAD (coronary artery disease)    A.  01/05/2002 Cath - nonobs dzs (LAD 20prox, RI 30-40 prox)   ED (erectile dysfunction)    GERD (gastroesophageal reflux disease)    History of mumps orchitis    Hyperlipidemia    HYPERTENSION 11/15/2006   INGUINAL HERNIA, RIGHT 12/05/2009   LEG CRAMPS 12/03/2008   SLEEP APNEA 11/16/2008   Sleep apnea    no cpap    Unstable angina (HCC)    VERTIGO, POSITIONAL 08/08/2009    Past Surgical History:  Procedure Laterality Date   CARDIAC CATHETERIZATION     COLONOSCOPY  2011   F/V-tics/normal- 10 yr recall   FOOT SURGERY Right 2019   KNEE ARTHROSCOPY Right    SHOULDER SURGERY Bilateral     Social History   Socioeconomic History   Marital status: Married    Spouse name: Not on file   Number of children: Not on file   Years of education: Not on file   Highest education level: 12th grade  Occupational History   Not on file  Tobacco Use   Smoking status: Never   Smokeless tobacco: Never  Vaping Use    Vaping status: Never Used  Substance and Sexual Activity   Alcohol use: No   Drug use: No   Sexual activity: Not on file  Other Topics Concern   Not on file  Social History Narrative   05/02/2011:  Currently unemployed.  Prev worked in a market researcher.  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  Does not exercise or adhere to any specific diet.   Social Drivers of Corporate Investment Banker Strain: Low Risk  (02/02/2024)   Overall Financial Resource Strain (CARDIA)    Difficulty of Paying Living Expenses: Not hard at all  Food Insecurity: No Food Insecurity (02/02/2024)   Hunger Vital Sign    Worried About Running Out of Food in the Last Year: Never true    Ran Out of Food in the Last Year: Never true  Transportation Needs: No Transportation Needs (02/02/2024)   PRAPARE - Administrator, Civil Service (Medical): No    Lack of Transportation (Non-Medical): No  Physical Activity: Inactive (02/02/2024)   Exercise Vital Sign    Days of Exercise per Week: 0 days    Minutes of Exercise per Session: Not on file  Stress: No Stress Concern Present (02/02/2024)   [employer] of Occupational Health - Occupational Stress Questionnaire    Feeling of Stress: Not at all  Social Connections: Socially Isolated (02/02/2024)   Social Connection and Isolation Panel    Frequency of  Communication with Friends and Family: Once a week    Frequency of Social Gatherings with Friends and Family: Once a week    Attends Religious Services: Never    Database Administrator or Organizations: No    Attends Engineer, Structural: Not on file    Marital Status: Married  Catering Manager Violence: Not At Risk (01/31/2023)   Humiliation, Afraid, Rape, and Kick questionnaire    Fear of Current or Ex-Partner: No    Emotionally Abused: No    Physically Abused: No    Sexually Abused: No    Family History  Problem Relation Age of Onset   Pneumonia Mother        died 57 - hip fx/pna   Heart attack  Father        died 61   Hypertension Sister        alive   Cancer Brother        died - agent orange exposure   Hypertension Brother        alive   Colon cancer Neg Hx    Colon polyps Neg Hx    Esophageal cancer Neg Hx    Rectal cancer Neg Hx    Stomach cancer Neg Hx     Current Outpatient Medications on File Prior to Visit  Medication Sig Dispense Refill   amLODipine  (NORVASC ) 5 MG tablet Take 1 tablet by mouth once daily 90 tablet 1   EPINEPHrine  0.3 mg/0.3 mL IJ SOAJ injection Inject 0.3 mg into the muscle as needed for anaphylaxis. 1 each 5   losartan  (COZAAR ) 100 MG tablet Take 1 tablet by mouth once daily 90 tablet 0   meclizine  (ANTIVERT ) 25 MG tablet TAKE 1 TABLET BY MOUTH THREE TIMES DAILY AS NEEDED FOR DIZZINESS 30 tablet 0   omeprazole  (PRILOSEC ) 40 MG capsule Take 1 capsule (40 mg total) by mouth daily. 90 capsule 3   simvastatin  (ZOCOR ) 10 MG tablet Take 1 tablet (10 mg total) by mouth at bedtime. 90 tablet 3   tamsulosin  (FLOMAX ) 0.4 MG CAPS capsule Take 1 capsule by mouth once daily 90 capsule 0   No current facility-administered medications on file prior to visit.    Allergies  Allergen Reactions   Bee Venom Anaphylaxis   Gabapentin  Other (See Comments)    Hallucinations    Codeine Phosphate Itching   Lipitor [Atorvastatin ] Other (See Comments)    Muscle cramps    Penicillins Other (See Comments)    Has patient had a PCN reaction causing immediate rash, facial/tongue/throat swelling, SOB or lightheadedness with hypotension: Unknown, childhood reaction Has patient had a PCN reaction causing severe rash involving mucus membranes or skin necrosis: No Has patient had a PCN reaction that required hospitalization No Has patient had a PCN reaction occurring within the last 10 years: No If all of the above answers are NO, then may proceed with Cephalosporin use.        Physical Exam Vitals requested from patient and listed below if patient had equipment and  was able to obtain at home for this virtual visit: There were no vitals filed for this visit. Estimated body mass index is 40.45 kg/m as calculated from the following:   Height as of 01/09/24: 5' 8 (1.727 m).   Weight as of 01/09/24: 266 lb (120.7 kg).  EKG (optional): deferred due to virtual visit  GENERAL: alert, oriented, no acute distress detected; full vision exam deferred due to pandemic and/or virtual encounter  HEENT:  atraumatic, conjunttiva clear, no obvious abnormalities on inspection of external nose and ears  NECK: normal movements of the head and neck  LUNGS: on inspection no signs of respiratory distress, breathing rate appears normal, no obvious gross SOB, gasping or wheezing  CV: no obvious cyanosis  MS: moves all visible extremities without noticeable abnormality  PSYCH/NEURO: pleasant and cooperative, no obvious depression or anxiety, speech and thought processing grossly intact, Cognitive function grossly intact  Flowsheet Row Office Visit from 04/16/2022 in [facility]  PHQ-9 Total Score 0        02/06/2024    3:46 PM 01/31/2023   12:14 PM 06/18/2022    3:26 PM 04/16/2022    1:26 PM 04/12/2022    9:43 AM  Depression screen PHQ 2/9  Decreased Interest 0 0 0 0 0  Down, Depressed, Hopeless 0 0 0 0 0  PHQ - 2 Score 0 0 0 0 0  Altered sleeping    0 0  Tired, decreased energy    0 0  Change in appetite    0 0  Feeling bad or failure about yourself     0 0  Trouble concentrating    0 0  Moving slowly or fidgety/restless    0 0  Suicidal thoughts    0 0  PHQ-9 Score    0 0  Difficult doing work/chores     Not difficult at all       04/12/2022    9:44 AM 06/18/2022    3:25 PM 01/31/2023   12:14 PM 02/02/2024    9:52 AM 02/06/2024    3:46 PM  Fall Risk  Falls in the past year? 0 0 0 0 0  Was there an injury with Fall? 0 0 0  0  Fall Risk Category Calculator 0 0 0  0  Fall Risk Category (Retired) Low       (RETIRED) Patient  Fall Risk Level Low fall risk       Patient at Risk for Falls Due to No Fall Risks Other (Comment) No Fall Risks    Fall risk Follow up Falls evaluation completed  Falls evaluation completed Falls evaluation completed       Data saved with a previous flowsheet row definition     SUMMARY AND PLAN:  Encounter for Medicare annual wellness exam  Discussed applicable health maintenance/preventive health measures and advised and referred or ordered per patient preferences: -discussed recs/risks for vaccines due, he is considering and knows can get at the pharmacy, advised to let us  know if he does so that we can update his record.  Health Maintenance  Topic Date Due   Influenza Vaccine  11/08/2023   COVID-19 Vaccine (3 - 2025-26 season) 12/09/2023   Medicare Annual Wellness (AWV)  02/05/2025   DTaP/Tdap/Td (2 - Td or Tdap) 03/06/2025   Pneumococcal Vaccine: 50+ Years  Completed   Hepatitis C Screening  Completed   Zoster Vaccines- Shingrix  Completed   Meningococcal B Vaccine  Aged Out   Colonoscopy  Discontinued     Education and counseling on the following was provided based on the above review of health and a plan/checklist for the patient, along with additional information discussed, was provided for the patient in the patient instructions :  -Advised on importance of completing advanced directives, discussed options for completing and provided information in patient instructions as well  -Advised and counseled on a healthy lifestyle - including the importance of a  healthy diet, regular physical activity -Reviewed patient's current diet. Advised and counseled on a whole foods based healthy diet.Spent a lot of time dicussing healthy options for breakfast, lunch, dinner, snacks and for when eating out. Stress reducing ultra processed foods and beverages, sweets and ensuring adequate protein from healthy sources, veggies, fruits and whole grains/legumes. A summary of a healthy diet was  provided in the Patient Instructions.  -reviewed patient's current physical activity level and discussed exercise guidelines for adults. Congratulated on staying active. Encouraged to add strength training 2 days per week and get a minimum of 150 minutes per week of mod exercise. Discussed community resources and ideas for safe exercise at home to assist in meeting exercise guideline recommendations in a safe and healthy way.  -Advise yearly dental visits at minimum and regular eye exams  Follow up: see patient instructions   Patient Instructions  I really enjoyed getting to talk with you today! I am available on Tuesdays and Thursdays for virtual visits if you have any questions or concerns, or if I can be of any further assistance.   CHECKLIST FROM ANNUAL WELLNESS VISIT:  -Follow up (please call to schedule if not scheduled after visit):   -yearly for annual wellness visit with primary care office  Here is a list of your preventive care/health maintenance measures and the plan for each if any are due:  PLAN For any measures below that may be due:    1. Can get the vaccines at the pharmacy. Please let me know if you do so that we can update your record.   Health Maintenance  Topic Date Due   Influenza Vaccine  11/08/2023   COVID-19 Vaccine (3 - 2025-26 season) 12/09/2023   Medicare Annual Wellness (AWV)  02/05/2025   DTaP/Tdap/Td (2 - Td or Tdap) 03/06/2025   Pneumococcal Vaccine: 50+ Years  Completed   Hepatitis C Screening  Completed   Zoster Vaccines- Shingrix  Completed   Meningococcal B Vaccine  Aged Out   Colonoscopy  Discontinued    -See a dentist at least yearly  -Get your eyes checked and then per your eye specialist's recommendations  -Other issues addressed today:   -I have included below further information regarding a healthy whole foods based diet, physical activity guidelines for adults, stress management and opportunities for social connections. I hope you  find this information useful.   -----------------------------------------------------------------------------------------------------------------------------------------------------------------------------------------------------------------------------------------------------------    NUTRITION: -eat real food: lots of colorful vegetables (half the plate) and fruits -5-7 servings of vegetables and fruits per day (fresh or steamed is best), exp. 2 servings of vegetables with lunch and dinner and 2 servings of fruit per day. Berries and greens such as kale and collards are great choices.  -consume on a regular basis:  fresh fruits, fresh veggies, fish, nuts, seeds, healthy oils (such as olive oil, avocado oil), whole grains (make sure for bread/pasta/crackers/etc., that the first ingredient on label contains the word whole), legumes. -can eat small amounts of dairy and lean meat (no larger than the palm of your hand), but avoid processed meats such as ham, bacon, lunch meat, etc. -drink water -try to avoid fast food and pre-packaged foods, processed meat, ultra processed foods/beverages (donuts, candy, etc.) -most experts advise limiting sodium to < 2300mg  per day, should limit further is any chronic conditions such as high blood pressure, heart disease, diabetes, etc. The American Heart Association advised that < 1500mg  is is ideal -try to avoid foods/beverages that contain any ingredients with names you  do not recognize  -try to avoid foods/beverages  with added sugar or sweeteners/sweets  -try to avoid sweet drinks (including diet drinks): soda, juice, Gatorade, sweet tea, power drinks, diet drinks -try to avoid white rice, white bread, pasta (unless whole grain)  EXERCISE GUIDELINES FOR ADULTS: -if you wish to increase your physical activity, do so gradually and with the approval of your doctor -STOP and seek medical care immediately if you have any chest pain, chest discomfort or trouble  breathing when starting or increasing exercise  -move and stretch your body, legs, feet and arms when sitting for long periods -Physical activity guidelines for optimal health in adults: -get at least 150 minutes per week of moderate exercise (can talk, but not sing); this is about 20-30 minutes of sustained activity 5-7 days per week or two 10-15 minute episodes of sustained activity 5-7 days per week -do some muscle building/resistance training/strength training at least 2 days per week  -balance exercises 3+ days per week:   Stand somewhere where you have something sturdy to hold onto if you lose balance    1) lift up on toes, then back down, start with 5x per day and work up to 20x   2) stand and lift one leg straight out to the side so that foot is a few inches of the floor, start with 5x each side and work up to 20x each side   3) stand on one foot, start with 5 seconds each side and work up to 20 seconds on each side  If you need ideas or help with getting more active:  -Silver sneakers https://tools.silversneakers.com  -Walk with a Doc: Http://www.duncan-williams.com/  -try to include resistance (weight lifting/strength building) and balance exercises twice per week: or the following link for ideas: http://castillo-Gugliotta.com/  buyducts.dk  STRESS MANAGEMENT: -can try meditating, or just sitting quietly with deep breathing while intentionally relaxing all parts of your body for 5 minutes daily -if you need further help with stress, anxiety or depression please follow up with your primary doctor or contact the wonderful folks at [facility]: [phone]  SOCIAL CONNECTIONS: -options in [place] if you wish to engage in more social and exercise related activities:  -Silver sneakers https://tools.silversneakers.com  -Walk with a Doc: Http://www.duncan-williams.com/  -Check out the  [facility] Active Adults 50+ section on the [place] of [employer] companies (hiking clubs, book clubs, cards and games, chess, exercise classes, aquatic classes and much more) - see the website for details: https://www.[place].gov/departments/parks-recreation/active-adults50  -YouTube has lots of exercise videos for different ages and abilities as well  -[facility] (a variety of indoor and outdoor inperson activities for adults). 2162202171. [address]  -Virtual Online Classes (a variety of topics): see seniorplanet.org or call [phone]  -consider volunteering at a school, hospice center, church, senior center or elsewhere            Chiquita JONELLE Cramp, DO

## 2024-02-06 NOTE — Patient Instructions (Signed)
 I really enjoyed getting to talk with you today! I am available on Tuesdays and Thursdays for virtual visits if you have any questions or concerns, or if I can be of any further assistance.   CHECKLIST FROM ANNUAL WELLNESS VISIT:  -Follow up (please call to schedule if not scheduled after visit):   -yearly for annual wellness visit with primary care office  Here is a list of your preventive care/health maintenance measures and the plan for each if any are due:  PLAN For any measures below that may be due:    1. Can get the vaccines at the pharmacy. Please let me know if you do so that we can update your record.   Health Maintenance  Topic Date Due   Influenza Vaccine  11/08/2023   COVID-19 Vaccine (3 - 2025-26 season) 12/09/2023   Medicare Annual Wellness (AWV)  02/05/2025   DTaP/Tdap/Td (2 - Td or Tdap) 03/06/2025   Pneumococcal Vaccine: 50+ Years  Completed   Hepatitis C Screening  Completed   Zoster Vaccines- Shingrix  Completed   Meningococcal B Vaccine  Aged Out   Colonoscopy  Discontinued    -See a dentist at least yearly  -Get your eyes checked and then per your eye specialist's recommendations  -Other issues addressed today:   -I have included below further information regarding a healthy whole foods based diet, physical activity guidelines for adults, stress management and opportunities for social connections. I hope you find this information useful.   -----------------------------------------------------------------------------------------------------------------------------------------------------------------------------------------------------------------------------------------------------------    NUTRITION: -eat real food: lots of colorful vegetables (half the plate) and fruits -5-7 servings of vegetables and fruits per day (fresh or steamed is best), exp. 2 servings of vegetables with lunch and dinner and 2 servings of fruit per day. Berries and greens such  as kale and collards are great choices.  -consume on a regular basis:  fresh fruits, fresh veggies, fish, nuts, seeds, healthy oils (such as olive oil, avocado oil), whole grains (make sure for bread/pasta/crackers/etc., that the first ingredient on label contains the word whole), legumes. -can eat small amounts of dairy and lean meat (no larger than the palm of your hand), but avoid processed meats such as ham, bacon, lunch meat, etc. -drink water -try to avoid fast food and pre-packaged foods, processed meat, ultra processed foods/beverages (donuts, candy, etc.) -most experts advise limiting sodium to < 2300mg  per day, should limit further is any chronic conditions such as high blood pressure, heart disease, diabetes, etc. The American Heart Association advised that < 1500mg  is is ideal -try to avoid foods/beverages that contain any ingredients with names you do not recognize  -try to avoid foods/beverages  with added sugar or sweeteners/sweets  -try to avoid sweet drinks (including diet drinks): soda, juice, Gatorade, sweet tea, power drinks, diet drinks -try to avoid white rice, white bread, pasta (unless whole grain)  EXERCISE GUIDELINES FOR ADULTS: -if you wish to increase your physical activity, do so gradually and with the approval of your doctor -STOP and seek medical care immediately if you have any chest pain, chest discomfort or trouble breathing when starting or increasing exercise  -move and stretch your body, legs, feet and arms when sitting for long periods -Physical activity guidelines for optimal health in adults: -get at least 150 minutes per week of moderate exercise (can talk, but not sing); this is about 20-30 minutes of sustained activity 5-7 days per week or two 10-15 minute episodes of sustained activity 5-7 days per week -do some  muscle building/resistance training/strength training at least 2 days per week  -balance exercises 3+ days per week:   Stand somewhere where  you have something sturdy to hold onto if you lose balance    1) lift up on toes, then back down, start with 5x per day and work up to 20x   2) stand and lift one leg straight out to the side so that foot is a few inches of the floor, start with 5x each side and work up to 20x each side   3) stand on one foot, start with 5 seconds each side and work up to 20 seconds on each side  If you need ideas or help with getting more active:  -Silver sneakers https://tools.silversneakers.com  -Walk with a Doc: Http://www.duncan-williams.com/  -try to include resistance (weight lifting/strength building) and balance exercises twice per week: or the following link for ideas: http://castillo-Ball[place].com/  buyducts.dk  STRESS MANAGEMENT: -can try meditating, or just sitting quietly with deep breathing while intentionally relaxing all parts of your body for 5 minutes daily -if you need further help with stress, anxiety or depression please follow up with your primary doctor or contact the wonderful folks at [facility]: [phone]  SOCIAL CONNECTIONS: -options in [place] if you wish to engage in more social and exercise related activities:  -Silver sneakers https://tools.silversneakers.com  -Walk with a Doc: Http://www.duncan-williams.com/  -Check out the [facility] Active Adults 50+ section on the [place] [employer] companies (hiking clubs, book clubs, cards and games, chess, exercise classes, aquatic classes and much more) - see the website for details: https://www.[place].gov/departments/parks-recreation/active-adults50  -YouTube has lots of exercise videos for different ages and abilities as well  -[facility] (a variety of indoor and outdoor inperson activities for adults). [phone]. [address].  -Virtual Online Classes (a variety of topics): see seniorplanet.org or  call [phone]  -consider volunteering at a school, hospice center, church, senior center or elsewhere

## 2024-02-09 ENCOUNTER — Other Ambulatory Visit: Payer: Self-pay | Admitting: Adult Health

## 2024-02-09 DIAGNOSIS — E782 Mixed hyperlipidemia: Secondary | ICD-10-CM

## 2024-02-11 ENCOUNTER — Ambulatory Visit: Admitting: Adult Health

## 2024-02-11 ENCOUNTER — Encounter: Payer: Self-pay | Admitting: Adult Health

## 2024-02-11 VITALS — BP 138/80 | HR 60 | Temp 97.6°F | Ht 69.0 in | Wt 265.0 lb

## 2024-02-11 DIAGNOSIS — Z23 Encounter for immunization: Secondary | ICD-10-CM

## 2024-02-11 DIAGNOSIS — Z6839 Body mass index (BMI) 39.0-39.9, adult: Secondary | ICD-10-CM

## 2024-02-11 DIAGNOSIS — E66812 Obesity, class 2: Secondary | ICD-10-CM

## 2024-02-11 DIAGNOSIS — N400 Enlarged prostate without lower urinary tract symptoms: Secondary | ICD-10-CM | POA: Diagnosis not present

## 2024-02-11 DIAGNOSIS — Z0000 Encounter for general adult medical examination without abnormal findings: Secondary | ICD-10-CM | POA: Diagnosis not present

## 2024-02-11 DIAGNOSIS — K219 Gastro-esophageal reflux disease without esophagitis: Secondary | ICD-10-CM | POA: Diagnosis not present

## 2024-02-11 DIAGNOSIS — I10 Essential (primary) hypertension: Secondary | ICD-10-CM | POA: Diagnosis not present

## 2024-02-11 DIAGNOSIS — M65341 Trigger finger, right ring finger: Secondary | ICD-10-CM | POA: Diagnosis not present

## 2024-02-11 DIAGNOSIS — G4733 Obstructive sleep apnea (adult) (pediatric): Secondary | ICD-10-CM | POA: Diagnosis not present

## 2024-02-11 LAB — COMPREHENSIVE METABOLIC PANEL WITH GFR
ALT: 17 U/L (ref 0–53)
AST: 19 U/L (ref 0–37)
Albumin: 4.8 g/dL (ref 3.5–5.2)
Alkaline Phosphatase: 78 U/L (ref 39–117)
BUN: 15 mg/dL (ref 6–23)
CO2: 27 meq/L (ref 19–32)
Calcium: 9.6 mg/dL (ref 8.4–10.5)
Chloride: 104 meq/L (ref 96–112)
Creatinine, Ser: 0.98 mg/dL (ref 0.40–1.50)
GFR: 76.68 mL/min (ref >60.00)
Glucose, Bld: 93 mg/dL (ref 70–99)
Potassium: 3.9 meq/L (ref 3.5–5.1)
Sodium: 141 meq/L (ref 135–145)
Total Bilirubin: 0.7 mg/dL (ref 0.2–1.2)
Total Protein: 7.6 g/dL (ref 6.0–8.3)

## 2024-02-11 LAB — LIPID PANEL
Cholesterol: 123 mg/dL (ref 0–200)
HDL: 38.5 mg/dL — ABNORMAL LOW (ref >39.00)
LDL Cholesterol: 49 mg/dL (ref 0–99)
NonHDL: 84.3
Total CHOL/HDL Ratio: 3
Triglycerides: 175 mg/dL — ABNORMAL HIGH (ref 0.0–149.0)
VLDL: 35 mg/dL (ref 0.0–40.0)

## 2024-02-11 LAB — CBC
HCT: 42.1 % (ref 39.0–52.0)
Hemoglobin: 14.4 g/dL (ref 13.0–17.0)
MCHC: 34.3 g/dL (ref 30.0–36.0)
MCV: 96 fl (ref 78.0–100.0)
Platelets: 226 K/uL (ref 150.0–400.0)
RBC: 4.39 Mil/uL (ref 4.22–5.81)
RDW: 13.7 % (ref 11.5–15.5)
WBC: 7.4 K/uL (ref 4.0–10.5)

## 2024-02-11 MED ORDER — MECLIZINE HCL 25 MG PO TABS: ORAL_TABLET | ORAL | 0 refills | Status: AC

## 2024-02-11 NOTE — Progress Notes (Signed)
 Subjective:    Patient ID: David Vincent, male    DOB: January 25, 1951, 73 y.o.   MRN: [id number]  HPI Patient presents for yearly preventative medicine examination. He is a pleasant 73 year old male who  has a past medical history of BACK PAIN (03/10/2007), CAD (coronary artery disease), ED (erectile dysfunction), GERD (gastroesophageal reflux disease), History of mumps orchitis, Hyperlipidemia, HYPERTENSION (11/15/2006), INGUINAL HERNIA, RIGHT (12/05/2009), LEG CRAMPS (12/03/2008), SLEEP APNEA (11/16/2008), Sleep apnea, Unstable angina (HCC), and VERTIGO, POSITIONAL (08/08/2009).  Hypertension-managed with Norvasc  5 mg daily and Cozaar  100 mg daily.  He denies dizziness, lightheadedness, chest pain, or shortness of breath BP Readings from Last 3 Encounters:  02/11/24 138/80  01/09/24 120/70  01/07/24 120/73   GERD-managed with Prilosec  20 mg daily  Hyperlipidemia- managed with Simvastatin  10 mg daily. He continues to have leg cramps mostly at night.  Lab Results  Component Value Date   CHOL 121 02/06/2023   HDL 38.50 (L) 02/06/2023   LDLCALC 52 02/06/2023   TRIG 152.0 (H) 02/06/2023   CHOLHDL 3 02/06/2023   OSA-refuses to wear CPAP. His last sleep study was greater than 10 years ago. Does not want to repeat at this time.   BPH-managed with Flomax  0.4 mg daily. Nocturia has been stable at 2-3 times a night.   Right sided trigger finger of ring finger - reports that his fingers continued to get stuck from time to time and is painful.    All immunizations and health maintenance protocols were reviewed with the patient and needed orders were placed.  Appropriate screening laboratory values were ordered for the patient including screening of hyperlipidemia, renal function and hepatic function. If indicated by BPH, a PSA was ordered.  Medication reconciliation,  past medical history, social history, problem list and allergies were reviewed in detail with the patient  Goals were established  with regard to weight loss, exercise, and  diet in compliance with medications Wt Readings from Last 3 Encounters:  02/11/24 265 lb (120.2 kg)  01/09/24 266 lb (120.7 kg)  01/07/24 267 lb 9.6 oz (121.4 kg)   Review of Systems  Constitutional: Negative.   HENT: Negative.    Eyes: Negative.   Respiratory: Negative.    Cardiovascular: Negative.   Gastrointestinal: Negative.   Endocrine: Negative.   Genitourinary:  Positive for frequency (nocturia).  Musculoskeletal: Negative.   Skin: Negative.   Allergic/Immunologic: Negative.   Neurological: Negative.   Hematological: Negative.   Psychiatric/Behavioral: Negative.    All other systems reviewed and are negative.  Past Medical History:  Diagnosis Date   BACK PAIN 03/10/2007   CAD (coronary artery disease)    A.  01/05/2002 Cath - nonobs dzs (LAD 20prox, RI 30-40 prox)   ED (erectile dysfunction)    GERD (gastroesophageal reflux disease)    History of mumps orchitis    Hyperlipidemia    HYPERTENSION 11/15/2006   INGUINAL HERNIA, RIGHT 12/05/2009   LEG CRAMPS 12/03/2008   SLEEP APNEA 11/16/2008   Sleep apnea    no cpap    Unstable angina (HCC)    VERTIGO, POSITIONAL 08/08/2009    Social History   Socioeconomic History   Marital status: Married    Spouse name: Not on file   Number of children: Not on file   Years of education: Not on file   Highest education level: 12th grade  Occupational History   Not on file  Tobacco Use   Smoking status: Never   Smokeless tobacco: Never  Vaping Use   Vaping status: Never Used  Substance and Sexual Activity   Alcohol use: No   Drug use: No   Sexual activity: Not on file  Other Topics Concern   Not on file  Social History Narrative   05/02/2011:  Currently unemployed.  Prev worked in a market researcher.  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  Does not exercise or adhere to any specific diet.   Social Drivers of Corporate Investment Banker Strain: Low Risk  (02/02/2024)   Overall Financial  Resource Strain (CARDIA)    Difficulty of Paying Living Expenses: Not hard at all  Food Insecurity: No Food Insecurity (02/02/2024)   Hunger Vital Sign    Worried About Running Out of Food in the Last Year: Never true    Ran Out of Food in the Last Year: Never true  Transportation Needs: No Transportation Needs (02/02/2024)   PRAPARE - Administrator, Civil Service (Medical): No    Lack of Transportation (Non-Medical): No  Physical Activity: Inactive (02/02/2024)   Exercise Vital Sign    Days of Exercise per Week: 0 days    Minutes of Exercise per Session: Not on file  Stress: No Stress Concern Present (02/02/2024)   [employer] of Occupational Health - Occupational Stress Questionnaire    Feeling of Stress: Not at all  Social Connections: Socially Isolated (02/02/2024)   Social Connection and Isolation Panel    Frequency of Communication with Friends and Family: Once a week    Frequency of Social Gatherings with Friends and Family: Once a week    Attends Religious Services: Never    Database Administrator or Organizations: No    Attends Engineer, Structural: Not on file    Marital Status: Married  Catering Manager Violence: Not At Risk (01/31/2023)   Humiliation, Afraid, Rape, and Kick questionnaire    Fear of Current or Ex-Partner: No    Emotionally Abused: No    Physically Abused: No    Sexually Abused: No    Past Surgical History:  Procedure Laterality Date   CARDIAC CATHETERIZATION     COLONOSCOPY  2011   F/V-tics/normal- 10 yr recall   FOOT SURGERY Right 2019   KNEE ARTHROSCOPY Right    SHOULDER SURGERY Bilateral     Family History  Problem Relation Age of Onset   Pneumonia Mother        died 8 - hip fx/pna   Heart attack Father        died 60   Hypertension Sister        alive   Cancer Brother        died - agent orange exposure   Hypertension Brother        alive   Colon cancer Neg Hx    Colon polyps Neg Hx    Esophageal  cancer Neg Hx    Rectal cancer Neg Hx    Stomach cancer Neg Hx     Allergies  Allergen Reactions   Bee Venom Anaphylaxis   Gabapentin  Other (See Comments)    Hallucinations    Codeine Phosphate Itching   Lipitor [Atorvastatin ] Other (See Comments)    Muscle cramps    Penicillins Other (See Comments)    Has patient had a PCN reaction causing immediate rash, facial/tongue/throat swelling, SOB or lightheadedness with hypotension: Unknown, childhood reaction Has patient had a PCN reaction causing severe rash involving mucus membranes or skin necrosis: No Has patient  had a PCN reaction that required hospitalization No Has patient had a PCN reaction occurring within the last 10 years: No If all of the above answers are NO, then may proceed with Cephalosporin use.     Current Outpatient Medications on File Prior to Visit  Medication Sig Dispense Refill   amLODipine  (NORVASC ) 5 MG tablet Take 1 tablet by mouth once daily 90 tablet 1   EPINEPHrine  0.3 mg/0.3 mL IJ SOAJ injection Inject 0.3 mg into the muscle as needed for anaphylaxis. 1 each 5   losartan  (COZAAR ) 100 MG tablet Take 1 tablet by mouth once daily 90 tablet 0   omeprazole  (PRILOSEC ) 40 MG capsule Take 1 capsule (40 mg total) by mouth daily. 90 capsule 3   simvastatin  (ZOCOR ) 10 MG tablet Take 1 tablet (10 mg total) by mouth at bedtime. 90 tablet 3   tamsulosin  (FLOMAX ) 0.4 MG CAPS capsule Take 1 capsule by mouth once daily 90 capsule 0   No current facility-administered medications on file prior to visit.    BP 138/80   Pulse 60   Temp 97.6 F (36.4 C) (Oral)   Ht 5' 9 (1.753 m)   Wt 265 lb (120.2 kg)   SpO2 96%   BMI 39.13 kg/m       Objective:   Physical Exam Vitals and nursing note reviewed.  Constitutional:      General: He is not in acute distress.    Appearance: Normal appearance. He is obese. He is not ill-appearing.  HENT:     Head: Normocephalic and atraumatic.     Right Ear: Tympanic membrane,  ear canal and external ear normal. There is no impacted cerumen.     Left Ear: Tympanic membrane, ear canal and external ear normal. There is no impacted cerumen.     Nose: Nose normal. No congestion or rhinorrhea.     Mouth/Throat:     Mouth: Mucous membranes are moist.     Pharynx: Oropharynx is clear.  Eyes:     Extraocular Movements: Extraocular movements intact.     Conjunctiva/sclera: Conjunctivae normal.     Pupils: Pupils are equal, round, and reactive to light.  Neck:     Vascular: No carotid bruit.  Cardiovascular:     Rate and Rhythm: Normal rate and regular rhythm.     Pulses: Normal pulses.     Heart sounds: No murmur heard.    No friction rub. No gallop.  Pulmonary:     Effort: Pulmonary effort is normal.     Breath sounds: Normal breath sounds.  Abdominal:     General: Abdomen is flat. Bowel sounds are normal. There is no distension.     Palpations: Abdomen is soft. There is no mass.     Tenderness: There is no abdominal tenderness. There is no guarding or rebound.     Hernia: No hernia is present.  Musculoskeletal:        General: Tenderness present. Normal range of motion.     Cervical back: Normal range of motion and neck supple.     Right lower leg: 1+ Pitting Edema present.     Left lower leg: 1+ Pitting Edema present.  Lymphadenopathy:     Cervical: No cervical adenopathy.  Skin:    General: Skin is warm and dry.     Capillary Refill: Capillary refill takes less than 2 seconds.  Neurological:     General: No focal deficit present.     Mental Status: He is alert  and oriented to person, place, and time.  Psychiatric:        Mood and Affect: Mood normal.        Behavior: Behavior normal.        Thought Content: Thought content normal.        Judgment: Judgment normal.       Assessment & Plan:  1. Routine general medical examination at a health care facility (Primary) Today patient counseled on age appropriate routine health concerns for screening and  prevention, each reviewed and up to date or declined. Immunizations reviewed and up to date or declined. Labs ordered and reviewed. Risk factors for depression reviewed and negative. Hearing function and visual acuity are intact. ADLs screened and addressed as needed. Functional ability and level of safety reviewed and appropriate. Education, counseling and referrals performed based on assessed risks today. Patient provided with a copy of personalized plan for preventive services.   2. Essential hypertension - Controlled. No change in medication  - Lipid panel; Future - TSH; Future - CBC; Future - Comprehensive metabolic panel with GFR; Future  3. Gastroesophageal reflux disease without esophagitis - Continue PPI - Lipid panel; Future - TSH; Future - CBC; Future - Comprehensive metabolic panel with GFR; Future  4. OSA (obstructive sleep apnea)  - Lipid panel; Future - TSH; Future - CBC; Future - Comprehensive metabolic panel with GFR; Future  5. Benign prostatic hyperplasia without lower urinary tract symptoms - Continue with Flomax  0.4 mg daily.  - Follow up with Urology as directed - PSA; Future  6. Trigger ring finger of right hand  - Ambulatory referral to Hand Surgery  7. Class 2 obesity - Reduce sugars and carbs.  - exercise   8. BMI 39.0-39.9,adult   9. Need for influenza vaccination  - Flu vaccine HIGH DOSE PF(Fluzone Trivalent)  Tobe Kervin, NP

## 2024-02-11 NOTE — Patient Instructions (Signed)
 It was great seeing you today   We will follow up with you regarding your lab work   Please let me know if you need anything   I have referred you to a hand specialists for the trigger finger

## 2024-02-12 ENCOUNTER — Other Ambulatory Visit: Payer: Self-pay | Admitting: Adult Health

## 2024-02-12 ENCOUNTER — Ambulatory Visit: Payer: Self-pay | Admitting: Adult Health

## 2024-02-12 DIAGNOSIS — E782 Mixed hyperlipidemia: Secondary | ICD-10-CM

## 2024-02-12 LAB — TSH: TSH: 1.19 u[IU]/mL (ref 0.35–5.50)

## 2024-02-12 LAB — PSA: PSA: 0.66 ng/mL (ref 0.10–4.00)

## 2024-02-12 MED ORDER — SIMVASTATIN 10 MG PO TABS: 10.0000 mg | ORAL_TABLET | Freq: Every day | ORAL | 3 refills | Status: AC

## 2024-02-14 ENCOUNTER — Ambulatory Visit (HOSPITAL_COMMUNITY)
Admission: RE | Admit: 2024-02-14 | Discharge: 2024-02-14 | Disposition: A | Discharge status: Home / Self Care | Payer: Self-pay | Source: Ambulatory Visit | Attending: Cardiology | Admitting: Cardiology

## 2024-02-14 ENCOUNTER — Ambulatory Visit (HOSPITAL_COMMUNITY)
Admission: RE | Admit: 2024-02-14 | Discharge: 2024-02-14 | Disposition: A | Discharge status: Home / Self Care | Payer: Self-pay | Source: Ambulatory Visit | Attending: Internal Medicine | Admitting: Internal Medicine

## 2024-02-14 DIAGNOSIS — R011 Cardiac murmur, unspecified: Secondary | ICD-10-CM | POA: Diagnosis not present

## 2024-02-14 DIAGNOSIS — R072 Precordial pain: Secondary | ICD-10-CM

## 2024-02-14 DIAGNOSIS — I2510 Atherosclerotic heart disease of native coronary artery without angina pectoris: Secondary | ICD-10-CM | POA: Diagnosis not present

## 2024-02-14 LAB — ECHOCARDIOGRAM COMPLETE
Area-P 1/2: 4.45 cm2
S' Lateral: 3 cm

## 2024-02-17 ENCOUNTER — Ambulatory Visit: Payer: Self-pay | Admitting: Cardiology

## 2024-02-17 NOTE — Progress Notes (Signed)
 Mild amount of calcium  in heart arteries.  In absence of exertional chest pain, okay to hold off stress testing at this time.  Cholesterol is very well-controlled with low-dose simvastatin , okay to continue the same.  Thanks MJP

## 2024-02-17 NOTE — Progress Notes (Signed)
 Normal pumping function of the heart. No severe heart valve abnormalities noted.  Please see separate message about calcium  score scan results sent today.  Thanks MJP

## 2024-02-27 DIAGNOSIS — M65341 Trigger finger, right ring finger: Secondary | ICD-10-CM | POA: Diagnosis not present

## 2024-02-27 DIAGNOSIS — M79641 Pain in right hand: Secondary | ICD-10-CM | POA: Diagnosis not present

## 2024-03-24 NOTE — Progress Notes (Unsigned)
 Cardiology Clinic Note   Patient Name: David Vincent Date of Encounter: 03/24/2024  Primary Care Provider:  Merna Huxley, NP Primary Cardiologist:  Newman JINNY Lawrence, MD  Patient Profile    David Vincent 73 year old male presents to the clinic today to review his echocardiogram and coronary calcium  scoring.  Past Medical History    Past Medical History:  Diagnosis Date   BACK PAIN 03/10/2007   CAD (coronary artery disease)    A.  01/05/2002 Cath - nonobs dzs (LAD 20prox, RI 30-40 prox)   ED (erectile dysfunction)    GERD (gastroesophageal reflux disease)    History of mumps orchitis    Hyperlipidemia    HYPERTENSION 11/15/2006   INGUINAL HERNIA, RIGHT 12/05/2009   LEG CRAMPS 12/03/2008   SLEEP APNEA 11/16/2008   Sleep apnea    no cpap    Unstable angina (HCC)    VERTIGO, POSITIONAL 08/08/2009   Past Surgical History:  Procedure Laterality Date   CARDIAC CATHETERIZATION     COLONOSCOPY  2011   F/V-tics/normal- 10 yr recall   FOOT SURGERY Right 2019   KNEE ARTHROSCOPY Right    SHOULDER SURGERY Bilateral     Allergies  Allergies[1]  History of Present Illness    David Vincent has a PMH of HTN, HLD, coronary artery disease, unstable angina, GERD, positional vertigo, low testosterone , and cardiac murmur.  He was seen and evaluated by Dr. Lawrence on 01/07/2024.  During that time he reported intermittent chest pain primarily at rest.  His pain would last for a few seconds and resolve spontaneously.  He denied chest pain with physical activity.  He reported shortness of breath.  He also reported recent lower extremity edema.  He consumed salt with each of his meals.  He reported a significant family history of heart disease with heart disease in both of his parents.  His father died of heart attack.  During that time he denied smoking and EtOH use.  Echocardiogram and coronary calcium  scoring were ordered.  His echocardiogram showed an LVEF of 60 to 65%, normal  diastolic parameters, trivial mitral valve regurgitation with no evidence of mitral valve stenosis, mild tricuspid valve regurgitation, and no other significant valvular abnormalities.  His coronary calcium  scoring showed a total score of 111 placing him in the 39th percentile for age, race and sex matched controls.  This was reviewed.  Dr. Lawrence recommended holding off on stress testing and continuing low-dose simvastatin .  He presents to the clinic today for follow-up evaluation and to review his testing.  He states***.  *** denies chest pain, shortness of breath, lower extremity edema, fatigue, palpitations, melena, hematuria, hemoptysis, diaphoresis, weakness, presyncope, syncope, orthopnea, and PND.   Coronary artery disease-no chest pain today.  Notes occasional brief episodes of chest discomfort.  Reassuring coronary CTA and echocardiogram.  Details above. Continue heart healthy low-sodium diet Continue current medical therapy Maintain physical activity  Essential hypertension-BP today***. Maintain blood pressure log Continue losartan , amlodipine   Hyperlipidemia-LDL***. High-fiber diet Continue simvastatin   Cardiac murmur-***/6 heard best along***.  Echocardiogram showed trivial mitral valve regurgitation and mild tricuspid valve regurgitation Heart healthy low-sodium diet Maintain physical activity Continue to monitor   Disposition: Follow-up with Dr. Lawrence or me in 6 months.  Home Medications    Prior to Admission medications  Medication Sig Start Date End Date Taking? Authorizing Provider  amLODipine  (NORVASC ) 5 MG tablet Take 1 tablet by mouth once daily 01/21/24   Nafziger, Cory, NP  EPINEPHrine   0.3 mg/0.3 mL IJ SOAJ injection Inject 0.3 mg into the muscle as needed for anaphylaxis. 07/11/22   Nafziger, Darleene, NP  losartan  (COZAAR ) 100 MG tablet Take 1 tablet by mouth once daily 01/02/24   Nafziger, Cory, NP  meclizine  (ANTIVERT ) 25 MG tablet TAKE 1 TABLET BY  MOUTH THREE TIMES DAILY AS NEEDED FOR DIZZINESS 02/11/24   Nafziger, Darleene, NP  omeprazole  (PRILOSEC ) 40 MG capsule Take 1 capsule (40 mg total) by mouth daily. 07/18/23   Nafziger, Darleene, NP  simvastatin  (ZOCOR ) 10 MG tablet Take 1 tablet (10 mg total) by mouth at bedtime. 02/12/24   Nafziger, Darleene, NP  tamsulosin  (FLOMAX ) 0.4 MG CAPS capsule Take 1 capsule by mouth once daily 04/24/23   Nafziger, Darleene, NP    Family History    Family History  Problem Relation Age of Onset   Pneumonia Mother        died 32 - hip fx/pna   Heart attack Father        died 67   Hypertension Sister        alive   Cancer Brother        died - agent orange exposure   Hypertension Brother        alive   Colon cancer Neg Hx    Colon polyps Neg Hx    Esophageal cancer Neg Hx    Rectal cancer Neg Hx    Stomach cancer Neg Hx    He indicated that his mother is deceased. He indicated that his father is deceased. He indicated that two of his three sisters are alive. He indicated that only one of his four brothers is alive. He indicated that the status of his neg hx is unknown.  Social History    Social History   Socioeconomic History   Marital status: Married    Spouse name: Not on file   Number of children: Not on file   Years of education: Not on file   Highest education level: 12th grade  Occupational History   Not on file  Tobacco Use   Smoking status: Never   Smokeless tobacco: Never  Vaping Use   Vaping status: Never Used  Substance and Sexual Activity   Alcohol use: No   Drug use: No   Sexual activity: Not on file  Other Topics Concern   Not on file  Social History Narrative   05/02/2011:  Currently unemployed.  Prev worked in a market researcher.  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  Does not exercise or adhere to any specific diet.   Social Drivers of Health   Tobacco Use: Low Risk (02/11/2024)   Patient History    Smoking Tobacco Use: Never    Smokeless Tobacco Use: Never    Passive Exposure: Not on  file  Financial Resource Strain: Low Risk (02/02/2024)   Overall Financial Resource Strain (CARDIA)    Difficulty of Paying Living Expenses: Not hard at all  Food Insecurity: No Food Insecurity (02/02/2024)   Epic    Worried About Programme Researcher, Broadcasting/film/video in the Last Year: Never true    Ran Out of Food in the Last Year: Never true  Transportation Needs: No Transportation Needs (02/02/2024)   Epic    Lack of Transportation (Medical): No    Lack of Transportation (Non-Medical): No  Physical Activity: Inactive (02/02/2024)   Exercise Vital Sign    Days of Exercise per Week: 0 days    Minutes of Exercise per Session: Not  on file  Stress: No Stress Concern Present (02/02/2024)   [employer] of Occupational Health - Occupational Stress Questionnaire    Feeling of Stress: Not at all  Social Connections: Socially Isolated (02/02/2024)   Social Connection and Isolation Panel    Frequency of Communication with Friends and Family: Once a week    Frequency of Social Gatherings with Friends and Family: Once a week    Attends Religious Services: Never    Database Administrator or Organizations: No    Attends Engineer, Structural: Not on file    Marital Status: Married  Catering Manager Violence: Not At Risk (01/31/2023)   Humiliation, Afraid, Rape, and Kick questionnaire    Fear of Current or Ex-Partner: No    Emotionally Abused: No    Physically Abused: No    Sexually Abused: No  Depression (PHQ2-9): Low Risk (02/06/2024)   Depression (PHQ2-9)    PHQ-2 Score: 0  Alcohol Screen: Low Risk (01/31/2023)   Alcohol Screen    Last Alcohol Screening Score (AUDIT): 0  Housing: Unknown (02/02/2024)   Epic    Unable to Pay for Housing in the Last Year: No    Number of Times Moved in the Last Year: Not on file    Homeless in the Last Year: No  Utilities: Not At Risk (01/31/2023)   AHC Utilities    Threatened with loss of utilities: No  Health Literacy: Adequate Health Literacy  (01/31/2023)   B1300 Health Literacy    Frequency of need for help with medical instructions: Never     Review of Systems    General:  No chills, fever, night sweats or weight changes.  Cardiovascular:  No chest pain, dyspnea on exertion, edema, orthopnea, palpitations, paroxysmal nocturnal dyspnea. Dermatological: No rash, lesions/masses Respiratory: No cough, dyspnea Urologic: No hematuria, dysuria Abdominal:   No nausea, vomiting, diarrhea, bright red blood per rectum, melena, or hematemesis Neurologic:  No visual changes, wkns, changes in mental status. All other systems reviewed and are otherwise negative except as noted above.  Physical Exam    VS:  There were no vitals taken for this visit. , BMI There is no height or weight on file to calculate BMI. GEN: Well nourished, well developed, in no acute distress. HEENT: normal. Neck: Supple, no JVD, carotid bruits, or masses. Cardiac: RRR, no murmurs, rubs, or gallops. No clubbing, cyanosis, edema.  Radials/DP/PT 2+ and equal bilaterally.  Respiratory:  Respirations regular and unlabored, clear to auscultation bilaterally. GI: Soft, nontender, nondistended, BS + x 4. MS: no deformity or atrophy. Skin: warm and dry, no rash. Neuro:  Strength and sensation are intact. Psych: Normal affect.  Accessory Clinical Findings    Recent Labs: 02/11/2024: ALT 17; BUN 15; Creatinine, Ser 0.98; Hemoglobin 14.4; Platelets 226.0; Potassium 3.9; Sodium 141; TSH 1.19   Recent Lipid Panel    Component Value Date/Time   CHOL 123 02/11/2024 0733   TRIG 175.0 (H) 02/11/2024 0733   HDL 38.50 (L) 02/11/2024 0733   CHOLHDL 3 02/11/2024 0733   VLDL 35.0 02/11/2024 0733   LDLCALC 49 02/11/2024 0733   LDLCALC 71 01/27/2020 0909    No BP recorded.  {Refresh Note OR Click here to enter BP  :1}***    ECG personally reviewed by me today- ***   Echocardiogram 02/14/2024  IMPRESSIONS      1. Left ventricular ejection fraction, by estimation, is  60 to 65%. Left ventricular ejection fraction by 3D volume is 70 %. The  left ventricle has normal function. The left ventricle has no regional wall motion abnormalities. Left ventricular diastolic  parameters were normal.  2. Right ventricular systolic function is normal. The right ventricular size is mildly enlarged. There is normal pulmonary artery systolic pressure.  3. Right atrial size was mildly dilated.  4. The mitral valve is normal in structure. Trivial mitral valve regurgitation. No evidence of mitral stenosis.  5. The aortic valve is tricuspid. Aortic valve regurgitation is not visualized. Aortic valve sclerosis is present, with no evidence of aortic valve stenosis.  6. The inferior vena cava is normal in size with greater than 50% respiratory variability, suggesting right atrial pressure of 3 mmHg.   FINDINGS  Left Ventricle: Left ventricular ejection fraction, by estimation, is 60 to 65%. Left ventricular ejection fraction by 3D volume is 70 %. The left ventricle has normal function. The left ventricle has no regional wall motion abnormalities. The left  ventricular internal cavity size was normal in size. There is no left ventricular hypertrophy. Left ventricular diastolic parameters were normal.   Right Ventricle: The right ventricular size is mildly enlarged. No increase in right ventricular wall thickness. Right ventricular systolic function is normal. There is normal pulmonary artery systolic pressure. The tricuspid regurgitant velocity is 2.52  m/s, and with an assumed right atrial pressure of 3 mmHg, the estimated right ventricular systolic pressure is 28.4 mmHg.   Left Atrium: Left atrial size was normal in size.   Right Atrium: Right atrial size was mildly dilated.   Pericardium: There is no evidence of pericardial effusion.   Mitral Valve: The mitral valve is normal in structure. Mild mitral annular calcification. Trivial mitral valve regurgitation. No evidence of mitral  valve stenosis.   Tricuspid Valve: The tricuspid valve is normal in structure. Tricuspid valve regurgitation is mild . No evidence of tricuspid stenosis.   Aortic Valve: The aortic valve is tricuspid. Aortic valve regurgitation is not visualized. Aortic valve sclerosis is present, with no evidence of aortic valve stenosis.   Pulmonic Valve: The pulmonic valve was normal in structure. Pulmonic valve regurgitation is not visualized. No evidence of pulmonic stenosis.   Aorta: The aortic root is normal in size and structure.   Venous: The inferior vena cava is normal in size with greater than 50% respiratory variability, suggesting right atrial pressure of 3 mmHg.   IAS/Shunts: No atrial level shunt detected by color flow Doppler.  Coronary calcium  scoring 02/2024  FINDINGS: Coronary arteries: Normal origins.   Coronary Calcium  Score:   Left main: 0   Left anterior descending artery: 0   Left circumflex artery: 1.46   Right coronary artery: 110   Total: 111   Percentile: 39th   Pericardium: Normal.   Ascending Aorta: Normal caliber.   Non-cardiac: See separate report from [facility].   IMPRESSION: Coronary calcium  score of 111. This was 39th percentile for age-, race-, and sex-matched controls.   RECOMMENDATIONS: Coronary artery calcium  (CAC) score is a strong predictor of incident coronary heart disease (CHD) and provides predictive information beyond traditional risk factors. CAC scoring is reasonable to use in the decision to withhold, postpone, or initiate statin therapy in intermediate-risk or selected borderline-risk asymptomatic adults (age 60-75 years and LDL-C >=70 to <190 mg/dL) who do not have diabetes or established atherosclerotic cardiovascular disease (ASCVD).* In intermediate-risk (10-year ASCVD risk >=7.5% to <20%) adults or selected borderline-risk (10-year ASCVD risk >=5% to <7.5%) adults in whom a CAC score is measured for the purpose of  making a treatment decision the following recommendations have been made:   If CAC=0, it is reasonable to withhold statin therapy and reassess in 5 to 10 years, as long as higher risk conditions are absent (diabetes mellitus, family history of premature CHD in first degree relatives (males <55 years; females <65 years), cigarette smoking, or LDL >=190 mg/dL).   If CAC is 1 to 99, it is reasonable to initiate statin therapy for patients >=86 years of age.   If CAC is >=100 or >=75th percentile, it is reasonable to initiate statin therapy at any age.   Assessment & Plan   1.  ***   Josefa HERO. Gibson Telleria NP-C     03/24/2024, 8:41 AM [facility] [address] 5th [address] [place], [place] [address] Office [phone]    Notice: This dictation was prepared with Dragon dictation along with smaller phrase technology. Any transcriptional errors that result from this process are unintentional and may not be corrected upon review.   I spent***minutes examining this patient, reviewing medications, and using patient centered shared decision making involving their cardiac care.   I spent  20 minutes reviewing past medical history,  medications, and prior cardiac tests.     [1]  Allergies Allergen Reactions   Bee Venom Anaphylaxis   Gabapentin  Other (See Comments)    Hallucinations    Codeine Phosphate Itching   Lipitor [Atorvastatin ] Other (See Comments)    Muscle cramps    Penicillins Other (See Comments)    Has patient had a PCN reaction causing immediate rash, facial/tongue/throat swelling, SOB or lightheadedness with hypotension: Unknown, childhood reaction Has patient had a PCN reaction causing severe rash involving mucus membranes or skin necrosis: No Has patient had a PCN reaction that required hospitalization No Has patient had a PCN reaction occurring within the last 10 years: No If all of the above answers are NO, then may proceed with  Cephalosporin use.

## 2024-03-27 ENCOUNTER — Ambulatory Visit: Attending: General Practice | Admitting: General Practice

## 2024-03-27 ENCOUNTER — Encounter: Payer: Self-pay | Admitting: General Practice

## 2024-03-27 VITALS — BP 136/64 | HR 78 | Ht 69.0 in | Wt 264.0 lb

## 2024-03-27 DIAGNOSIS — I10 Essential (primary) hypertension: Secondary | ICD-10-CM | POA: Diagnosis not present

## 2024-03-27 DIAGNOSIS — R011 Cardiac murmur, unspecified: Secondary | ICD-10-CM | POA: Diagnosis not present

## 2024-03-27 DIAGNOSIS — E782 Mixed hyperlipidemia: Secondary | ICD-10-CM | POA: Diagnosis not present

## 2024-03-27 DIAGNOSIS — I2510 Atherosclerotic heart disease of native coronary artery without angina pectoris: Secondary | ICD-10-CM | POA: Diagnosis not present

## 2024-03-27 NOTE — Patient Instructions (Signed)
Medication Instructions:  Your physician recommends that you continue on your current medications as directed. Please refer to the Current Medication list given to you today.  *If you need a refill on your cardiac medications before your next appointment, please call your pharmacy*  Lab Work: NONE ORDERED If you have labs (blood work) drawn today and your tests are completely normal, you will receive your results only by: MyChart Message (if you have MyChart) OR A paper copy in the mail If you have any lab test that is abnormal or we need to change your treatment, we will call you to review the results.  Testing/Procedures: NONE ORDERED  Follow-Up: At [facility], you and your health needs are our priority.  As part of our continuing mission to provide you with exceptional heart care, our providers are all part of one team.  This team includes your primary Cardiologist (physician) and Advanced Practice Providers or APPs (Physician Assistants and Nurse Practitioners) who all work together to provide you with the care you need, when you need it.  Your next appointment:   9 months - 1 year   Provider:   Newman JINNY Lawrence, MD or Josefa Beauvais, NP   We recommend signing up for the patient portal called MyChart.  Sign up information is provided on this After Visit Summary.  MyChart is used to connect with patients for Virtual Visits (Telemedicine).  Patients are able to view lab/test results, encounter notes, upcoming appointments, etc.  Non-urgent messages can be sent to your provider as well.   To learn more about what you can do with MyChart, go to [email or web address].   Other Instructions  [employer]:     [employer]  Outlet Store  Training And Development Officer for [employer] Mailing Address:  [address];   [address]  [place], [place] [address]  Tel [phone] Fx [phone]     High Quality Legwear for Today's Active Lifestyles Maximum  Compression at the ankle. Compression lessens gradually up the leg.   We manufacture a wide range of compression hosiery for men and women in  different styles, constructions and levels of support.  How Compression Hosiery Works Regulatory Affairs Officer, [employer]. compression hosiery works by applying graduated pressure to the  muscles and veins in the legs.  When the calf muscle contracts such as during walking  the compression hosiery will give and then return to its original position. By doing so  the hosiery is assists your body's circulatory wellness.  The result is increased leg health and vitality.   Maximum Compression at the ankle Compression lessens gradually up the leg  We Offer: Sheer & Opaque Stockings       COLORS:  Nude, black, white and misc. prints Below Knee Thigh High Pantyhose  High Quality Legwear for Today's Active Lifestyles We manufacture a wide range of compression hosiery for men and women in different styles, construction sand levels of support.  Socks:                     Sheer & Opaque   Compression Levels Include:                                  Stockings Men's               Below Knee  8-15 mmHg   Women's         Thigh High                 15-20 mmHg  Unisex             Pantyhose                 20-30 mmHg                                                                      30-40 mmHg  4 Simple Ways to Order   Email  [email or web address] Mail/Email orders are subject to processing and handling charges. Allow 7-10 days for receipt.  Phone [phone]  Please allow 24 hours for return call.   In Person  We recommend calling prior to your visit to confirm store hours as they may change due to holiday, weather, and maintenance.   By Mail When placing an order, please have the following information available. Our representatives are available to assist.     Measurements    THIGH      in.   CALF        in.   ANKLE     in.    Compression   8-15 mmHg 15-20 mmHg**   20-30 mmHg 30-40 mmHg   WOMEN'S MEN'S  Shoe Size Sock Size Shoe Size Sock Size  4 - 5 Small 7.5 and Under Small  5.5 - 7.5 Medium 8 - 10 Medium  8 - 10 Large 10.5 - 12 Large  10.5 and Over X-Large 12.5 and Over X-Large   Knee High Size Chart  Length from CALF MEASUREMENT  floor to bend   in knee. 11 12 13 14 15 16 17 18 19 20 21 22  14  S S S S M M L L L XL XL XL  15 S S S M M L L L XL XL XXL XXL  16 S S M M M L L L XL XXL XXL XXL  17 S M M M M L L XL XL XXL XXL XXL  18 M M M M L L L XL XL XXL XXL XXL  19 M M M M L L XL XL XL XXL XXL XXL   Thigh High Circumference Sizing Chart                 S M L XL XXL  ANKLE 6.5 - 8 8 - 9.5 9.5 - 11 11 - 12.5 12.5 - 14  CALF 10.5 - 14.5 11.5 - 15.5 12.5 - 17 13.5 - 17.5 14.5 - 19.5  THIGH 15.5 - 22 17.5 - 24 19.5 - 26 22 - 28 26 - 32  HIP UP TO 40 UP TO 44 UP TO 48' UP TO 52 UP TO 56   Pantyhose Size Chart  Height Petite Medium Tall X-Tall Queen Queen +   Weight Weight Weight Weight Weight Weight  4'11 95-130 135      5'0 95-125 130-145   170-185   5'1 90-120 125-155 160-165  170-195   5'2 90-115 120-145 150-165  170-195   5'3 90-110 115-140 145-165  170-200 200-225  5'4 100-105 110-135 140-160 165 170-200  200-225  °5'5 100 105-130 135-160 165 170-200 200-225  °5'6  110-125 130-155 160-165 170-200 200-225  °5'7  110-120 125-150 155-165 170-200 195-225  °5'8   120-145 150-165 170-200 190-225  °5'9   125-140 145-170 175-190 185-220  °5'10   125-135 140-185  185-215  °5'11   130-135 140-185  190-210  ° ° °   ° °   °

## 2024-03-28 ENCOUNTER — Other Ambulatory Visit: Payer: Self-pay | Admitting: Adult Health

## 2024-05-01 ENCOUNTER — Encounter: Payer: Self-pay | Admitting: Adult Health

## 2024-05-01 ENCOUNTER — Ambulatory Visit: Admitting: Adult Health

## 2024-05-01 ENCOUNTER — Ambulatory Visit: Payer: Self-pay

## 2024-05-01 VITALS — BP 120/70 | HR 83 | Temp 98.0°F | Ht 69.0 in | Wt 268.0 lb

## 2024-05-01 DIAGNOSIS — N3001 Acute cystitis with hematuria: Secondary | ICD-10-CM | POA: Diagnosis not present

## 2024-05-01 LAB — POCT URINALYSIS DIPSTICK
Bilirubin, UA: POSITIVE
Blood, UA: POSITIVE
Glucose, UA: NEGATIVE
Ketones, UA: NEGATIVE
Nitrite, UA: POSITIVE
Protein, UA: NEGATIVE
Spec Grav, UA: 1.02
Urobilinogen, UA: 2 U/dL — AB
pH, UA: 6

## 2024-05-01 MED ORDER — CIPROFLOXACIN HCL 500 MG PO TABS: 500.0000 mg | ORAL_TABLET | Freq: Two times a day (BID) | ORAL | 0 refills | Status: AC

## 2024-05-01 NOTE — Progress Notes (Signed)
 "  Subjective:    Patient ID: David Vincent, male    DOB: [birth year], 74 y.o.   MRN: [id number]  HPI Discussed the use of AI scribe software for clinical note transcription with the patient, who gave verbal consent to proceed.  History of Present Illness   David Vincent is a 74 year old male who presents with symptoms of a urinary tract infection.  He developed burning with urination yesterday. Azo gave only temporary relief, and the burning has persisted. He notes marked urinary frequency, urinating about every hour overnight.  He denies fever, chills, low back pain, or abdominal pain. He has a follow-up visit with urology next Wednesday.   His last UTI was in March 2025        Review of Systems See HPI   Past Medical History:  Diagnosis Date   BACK PAIN 03/10/2007   CAD (coronary artery disease)    A.  01/05/2002 Cath - nonobs dzs (LAD 20prox, RI 30-40 prox)   ED (erectile dysfunction)    GERD (gastroesophageal reflux disease)    History of mumps orchitis    Hyperlipidemia    HYPERTENSION 11/15/2006   INGUINAL HERNIA, RIGHT 12/05/2009   LEG CRAMPS 12/03/2008   SLEEP APNEA 11/16/2008   Sleep apnea    no cpap    Unstable angina (HCC)    VERTIGO, POSITIONAL 08/08/2009    Social History   Socioeconomic History   Marital status: Married    Spouse name: Not on file   Number of children: Not on file   Years of education: Not on file   Highest education level: 12th grade  Occupational History   Not on file  Tobacco Use   Smoking status: Never   Smokeless tobacco: Never  Vaping Use   Vaping status: Never Used  Substance and Sexual Activity   Alcohol use: No   Drug use: No   Sexual activity: Not on file  Other Topics Concern   Not on file  Social History Narrative   05/02/2011:  Currently unemployed.  Prev worked in a market researcher.  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  Does not exercise or adhere to any specific diet.   Social Drivers of Health   Tobacco Use: Low Risk  (05/01/2024)   Patient History    Smoking Tobacco Use: Never    Smokeless Tobacco Use: Never    Passive Exposure: Not on file  Financial Resource Strain: Low Risk (02/02/2024)   Overall Financial Resource Strain (CARDIA)    Difficulty of Paying Living Expenses: Not hard at all  Food Insecurity: No Food Insecurity (02/02/2024)   Epic    Worried About Programme Researcher, Broadcasting/film/video in the Last Year: Never true    Ran Out of Food in the Last Year: Never true  Transportation Needs: No Transportation Needs (02/02/2024)   Epic    Lack of Transportation (Medical): No    Lack of Transportation (Non-Medical): No  Physical Activity: Inactive (02/02/2024)   Exercise Vital Sign    Days of Exercise per Week: 0 days    Minutes of Exercise per Session: Not on file  Stress: No Stress Concern Present (02/02/2024)   [employer] of Occupational Health - Occupational Stress Questionnaire    Feeling of Stress: Not at all  Social Connections: Socially Isolated (02/02/2024)   Social Connection and Isolation Panel    Frequency of Communication with Friends and Family: Once a week    Frequency of Social Gatherings with Friends  and Family: Once a week    Attends Religious Services: Never    Active Member of Clubs or Organizations: No    Attends Engineer, Structural: Not on file    Marital Status: Married  Catering Manager Violence: Not At Risk (01/31/2023)   Humiliation, Afraid, Rape, and Kick questionnaire    Fear of Current or Ex-Partner: No    Emotionally Abused: No    Physically Abused: No    Sexually Abused: No  Depression (PHQ2-9): Low Risk (02/06/2024)   Depression (PHQ2-9)    PHQ-2 Score: 0  Alcohol Screen: Low Risk (01/31/2023)   Alcohol Screen    Last Alcohol Screening Score (AUDIT): 0  Housing: Unknown (02/02/2024)   Epic    Unable to Pay for Housing in the Last Year: No    Number of Times Moved in the Last Year: Not on file    Homeless in the Last Year: No  Utilities: Not At Risk  (01/31/2023)   [facility] Utilities    Threatened with loss of utilities: No  Health Literacy: Adequate Health Literacy (01/31/2023)   B1300 Health Literacy    Frequency of need for help with medical instructions: Never    Past Surgical History:  Procedure Laterality Date   CARDIAC CATHETERIZATION     COLONOSCOPY  2011   F/V-tics/normal- 10 yr recall   FOOT SURGERY Right 2019   KNEE ARTHROSCOPY Right    SHOULDER SURGERY Bilateral     Family History  Problem Relation Age of Onset   Pneumonia Mother        died 14 - hip fx/pna   Heart attack Father        died 62   Hypertension Sister        alive   Cancer Brother        died - agent orange exposure   Hypertension Brother        alive   Colon cancer Neg Hx    Colon polyps Neg Hx    Esophageal cancer Neg Hx    Rectal cancer Neg Hx    Stomach cancer Neg Hx     Allergies[1]  Medications Ordered Prior to Encounter[2]  BP 120/70   Pulse 83   Temp 98 F (36.7 C) (Oral)   Ht 5' 9 (1.753 m)   Wt 268 lb (121.6 kg)   SpO2 97%   BMI 39.58 kg/m       Objective:   Physical Exam Vitals and nursing note reviewed.  Constitutional:      Appearance: Normal appearance. He is obese.  Cardiovascular:     Rate and Rhythm: Regular rhythm.     Pulses: Normal pulses.     Heart sounds: Normal heart sounds.  Pulmonary:     Effort: Pulmonary effort is normal.     Breath sounds: Normal breath sounds.  Abdominal:     General: Bowel sounds are normal. There is no distension.     Tenderness: There is no abdominal tenderness. There is no right CVA tenderness or left CVA tenderness.  Skin:    General: Skin is warm and dry.  Neurological:     General: No focal deficit present.     Mental Status: He is alert and oriented to person, place, and time.  Psychiatric:        Mood and Affect: Mood normal.        Behavior: Behavior normal.        Thought Content: Thought content normal.  Judgment: Judgment normal.       Assessment  & Plan:  Assessment and Plan    Urinary tract infection Recurrent UTI with dysuria and frequency. Previous antibiotics effective. - Prescribed Cipro  500 mg BID for 10 days. - Advised discussion of recurrent infections with urologist at upcoming appointment. - Recommended reducing sugar intake and increasing water consumption.       Winry Egnew, NP     [1]  Allergies Allergen Reactions   Bee Venom Anaphylaxis   Gabapentin  Other (See Comments)    Hallucinations    Codeine Phosphate Itching   Lipitor [Atorvastatin ] Other (See Comments)    Muscle cramps    Penicillins Other (See Comments)    Has patient had a PCN reaction causing immediate rash, facial/tongue/throat swelling, SOB or lightheadedness with hypotension: Unknown, childhood reaction Has patient had a PCN reaction causing severe rash involving mucus membranes or skin necrosis: No Has patient had a PCN reaction that required hospitalization No Has patient had a PCN reaction occurring within the last 10 years: No If all of the above answers are NO, then may proceed with Cephalosporin use.   [2]  Current Outpatient Medications on File Prior to Visit  Medication Sig Dispense Refill   amLODipine  (NORVASC ) 5 MG tablet Take 1 tablet by mouth once daily 90 tablet 1   EPINEPHrine  0.3 mg/0.3 mL IJ SOAJ injection Inject 0.3 mg into the muscle as needed for anaphylaxis. 1 each 5   losartan  (COZAAR ) 100 MG tablet Take 1 tablet by mouth once daily 90 tablet 2   meclizine  (ANTIVERT ) 25 MG tablet TAKE 1 TABLET BY MOUTH THREE TIMES DAILY AS NEEDED FOR DIZZINESS 30 tablet 0   omeprazole  (PRILOSEC ) 40 MG capsule Take 1 capsule (40 mg total) by mouth daily. 90 capsule 3   simvastatin  (ZOCOR ) 10 MG tablet Take 1 tablet (10 mg total) by mouth at bedtime. 90 tablet 3   tamsulosin  (FLOMAX ) 0.4 MG CAPS capsule Take 1 capsule by mouth once daily 90 capsule 0   No current facility-administered medications on file prior to visit.   "

## 2024-05-01 NOTE — Telephone Encounter (Signed)
 FYI Only or Action Required?: FYI only for provider: appointment scheduled on 05/01/24.  Patient was last seen in primary care on 02/11/2024 by Merna Huxley, NP.  Called Nurse Triage reporting Dysuria.  Symptoms began yesterday.  Interventions attempted: OTC medications: AZO.  Symptoms are: improved d/t AZO.  Triage Disposition: See Physician Within 24 Hours  Patient/caregiver understands and will follow disposition?: Yes  Reason for Disposition  All other males with painful urination  Answer Assessment - Initial Assessment Questions Pt reports burning with urination since yesterday. Started taking AZO with some improvement. Advised to hold AZO until appt today.  1. SEVERITY: How bad is the pain?  (e.g., Scale 1-10; mild, moderate, or severe)     Becoming severe prior to taking AZO 2. FREQUENCY: How many times have you had painful urination today?      Was urinating hourly, now once this AM 3. PATTERN: Is pain present every time you urinate or just sometimes?      With every urination prior to AZO 4. ONSET: When did the painful urination start?      Yesterday 5. FEVER: Do you have a fever? If Yes, ask: What is your temperature, how was it measured, and when did it start?     Denies 6. PAST UTI: Have you had a urine infection before? If Yes, ask: When was the last time? and What happened that time?      One year ago 7. CAUSE: What do you think is causing the painful urination?      UTI 8. OTHER SYMPTOMS: Do you have any other symptoms? (e.g., flank pain, penis discharge, scrotal pain, blood in urine)     Denies  Protocols used: Urination Pain - Male-A-AH   Message from Pennwyn H sent at 05/01/2024  9:43 AM EST  Reason for Triage: Possible UTI, burning and pain while urinating

## 2024-05-02 LAB — URINE CULTURE
MICRO NUMBER:: 17508632
SPECIMEN QUALITY:: ADEQUATE

## 2024-05-05 ENCOUNTER — Ambulatory Visit: Payer: Self-pay | Admitting: Adult Health

## 2024-05-13 ENCOUNTER — Other Ambulatory Visit: Payer: Self-pay | Admitting: Adult Health

## 2024-05-13 DIAGNOSIS — N401 Enlarged prostate with lower urinary tract symptoms: Secondary | ICD-10-CM
